# Patient Record
Sex: Female | Born: 1960 | Race: White | Hispanic: No | Marital: Married | Smoking: Former smoker
Health system: Southern US, Community
[De-identification: ages and names within clinical notes are randomized; demographics above are authoritative.]

## PROBLEM LIST (undated history)

## (undated) DIAGNOSIS — J45909 Unspecified asthma, uncomplicated: Secondary | ICD-10-CM

## (undated) DIAGNOSIS — T7840XA Allergy, unspecified, initial encounter: Secondary | ICD-10-CM

## (undated) DIAGNOSIS — I499 Cardiac arrhythmia, unspecified: Secondary | ICD-10-CM

## (undated) DIAGNOSIS — K589 Irritable bowel syndrome without diarrhea: Secondary | ICD-10-CM

## (undated) DIAGNOSIS — L719 Rosacea, unspecified: Secondary | ICD-10-CM

## (undated) DIAGNOSIS — M1990 Unspecified osteoarthritis, unspecified site: Secondary | ICD-10-CM

## (undated) DIAGNOSIS — Z9889 Other specified postprocedural states: Secondary | ICD-10-CM

## (undated) DIAGNOSIS — K635 Polyp of colon: Secondary | ICD-10-CM

## (undated) DIAGNOSIS — F32A Depression, unspecified: Secondary | ICD-10-CM

## (undated) DIAGNOSIS — K648 Other hemorrhoids: Secondary | ICD-10-CM

## (undated) DIAGNOSIS — E785 Hyperlipidemia, unspecified: Secondary | ICD-10-CM

## (undated) DIAGNOSIS — F419 Anxiety disorder, unspecified: Secondary | ICD-10-CM

## (undated) DIAGNOSIS — R112 Nausea with vomiting, unspecified: Secondary | ICD-10-CM

## (undated) DIAGNOSIS — K219 Gastro-esophageal reflux disease without esophagitis: Secondary | ICD-10-CM

## (undated) DIAGNOSIS — F329 Major depressive disorder, single episode, unspecified: Secondary | ICD-10-CM

## (undated) HISTORY — PX: CYST EXCISION: SHX5701

## (undated) HISTORY — DX: Hyperlipidemia, unspecified: E78.5

## (undated) HISTORY — PX: UPPER GASTROINTESTINAL ENDOSCOPY: SHX188

## (undated) HISTORY — PX: CHOLECYSTECTOMY: SHX55

## (undated) HISTORY — DX: Depression, unspecified: F32.A

## (undated) HISTORY — PX: CARPAL TUNNEL RELEASE: SHX101

## (undated) HISTORY — DX: Other hemorrhoids: K64.8

## (undated) HISTORY — DX: Polyp of colon: K63.5

## (undated) HISTORY — DX: Anxiety disorder, unspecified: F41.9

## (undated) HISTORY — DX: Irritable bowel syndrome, unspecified: K58.9

## (undated) HISTORY — DX: Unspecified asthma, uncomplicated: J45.909

## (undated) HISTORY — DX: Major depressive disorder, single episode, unspecified: F32.9

## (undated) HISTORY — PX: TUMOR EXCISION: SHX421

## (undated) HISTORY — PX: NASAL SEPTUM SURGERY: SHX37

## (undated) HISTORY — PX: POLYPECTOMY: SHX149

## (undated) HISTORY — PX: OTHER SURGICAL HISTORY: SHX169

## (undated) HISTORY — DX: Allergy, unspecified, initial encounter: T78.40XA

## (undated) HISTORY — PX: APPENDECTOMY: SHX54

## (undated) HISTORY — DX: Gastro-esophageal reflux disease without esophagitis: K21.9

## (undated) HISTORY — PX: FOOT SURGERY: SHX648

## (undated) HISTORY — DX: Rosacea, unspecified: L71.9

---

## 1998-09-06 HISTORY — PX: VAGINAL HYSTERECTOMY: SUR661

## 2003-06-16 ENCOUNTER — Emergency Department (HOSPITAL_COMMUNITY): Admission: EM | Admit: 2003-06-16 | Discharge: 2003-06-16 | Payer: Self-pay | Admitting: Emergency Medicine

## 2003-06-16 ENCOUNTER — Encounter: Payer: Self-pay | Admitting: Emergency Medicine

## 2005-03-03 ENCOUNTER — Emergency Department (HOSPITAL_COMMUNITY): Admission: EM | Admit: 2005-03-03 | Discharge: 2005-03-03 | Payer: Self-pay | Admitting: Emergency Medicine

## 2005-03-03 IMAGING — CR DG CHEST 2V
2 series · 2 of 2 positions shown · non-contrast
Comparison: none

CLINICAL DATA: Mid to left chest pain for a couple of days.  Previous smoker.  History of asthma as a child.      
 CHEST - TWO VIEW:  
 Accentuated peribronchial markings compatible with chronic changes.  No acute pulmonary process.  Normal cardiomediastinal silhouette size and contour.

[w chest pa]
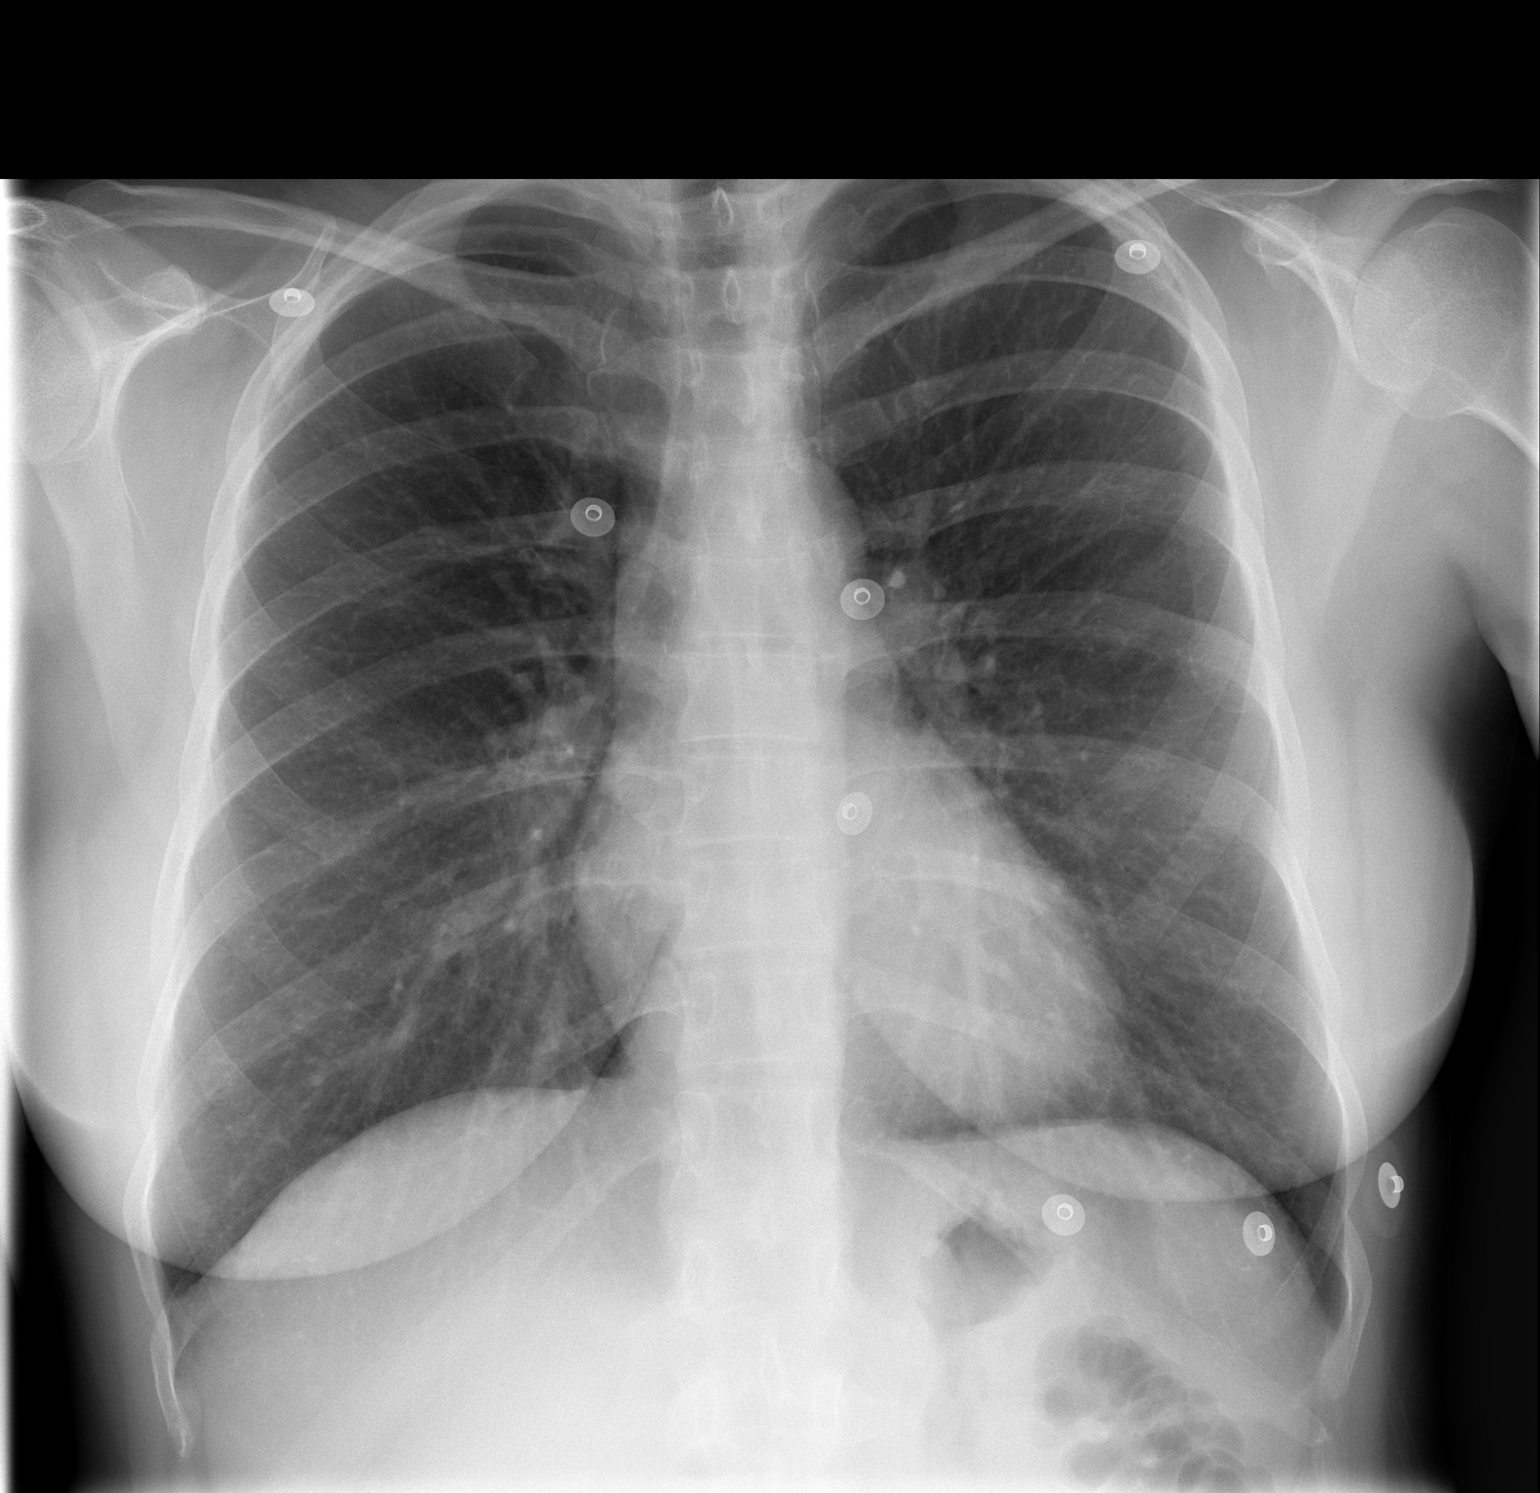

[w chest lat]
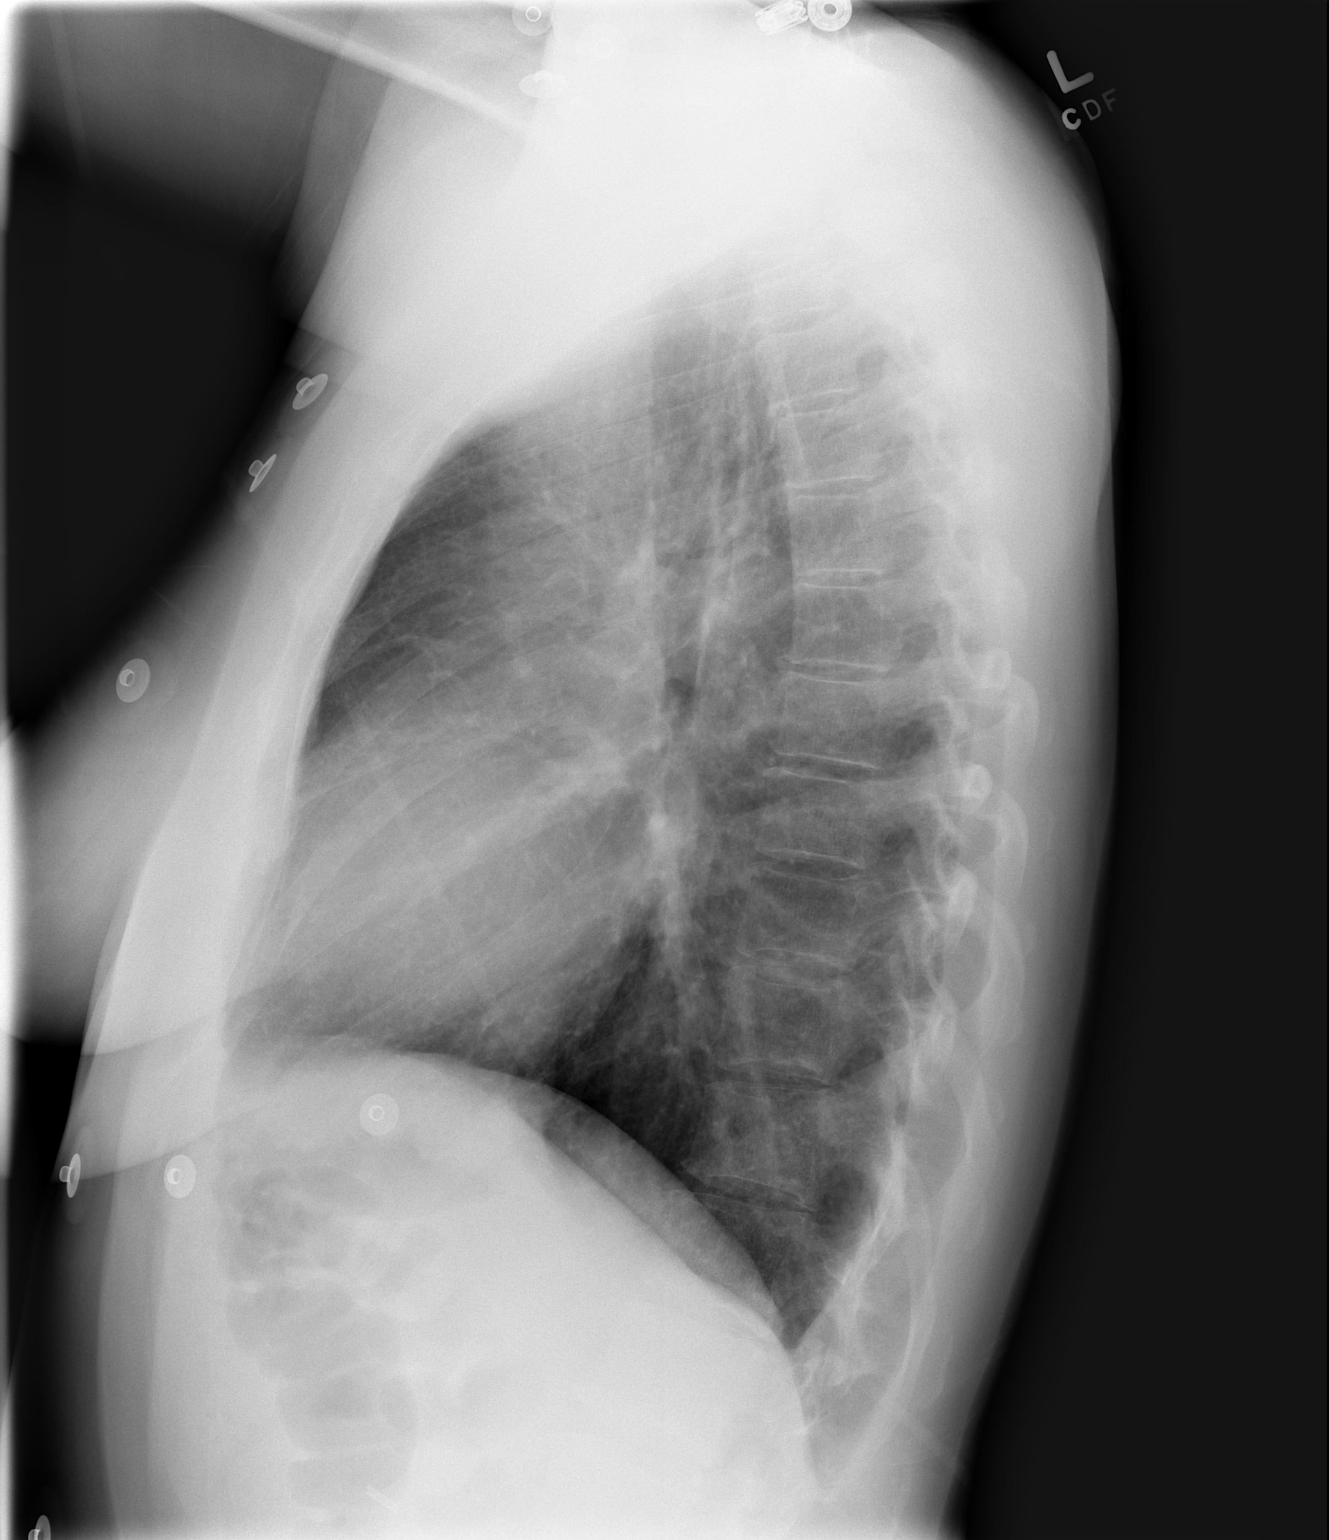

[2 of 2 positions shown; findings below may reference images not displayed]

IMPRESSION: No acute chest disease.

## 2005-09-06 HISTORY — PX: COLONOSCOPY: SHX174

## 2005-09-06 HISTORY — PX: OTHER SURGICAL HISTORY: SHX169

## 2006-03-23 ENCOUNTER — Encounter: Payer: Self-pay | Admitting: Family Medicine

## 2006-04-04 ENCOUNTER — Ambulatory Visit (HOSPITAL_COMMUNITY): Admission: RE | Admit: 2006-04-04 | Discharge: 2006-04-04 | Payer: Self-pay | Admitting: Family Medicine

## 2006-08-08 ENCOUNTER — Ambulatory Visit: Payer: Self-pay | Admitting: Internal Medicine

## 2006-08-12 LAB — HM COLONOSCOPY: HM Colonoscopy: NORMAL

## 2006-08-22 ENCOUNTER — Encounter: Payer: Self-pay | Admitting: Family Medicine

## 2006-08-22 ENCOUNTER — Ambulatory Visit: Payer: Self-pay | Admitting: Internal Medicine

## 2006-08-22 LAB — HM COLONOSCOPY: HM Colonoscopy: NORMAL

## 2007-09-07 HISTORY — PX: ULNAR NERVE TRANSPOSITION: SHX2595

## 2008-09-06 HISTORY — PX: FOOT NEUROMA SURGERY: SHX646

## 2008-10-02 ENCOUNTER — Ambulatory Visit: Payer: Self-pay | Admitting: Family Medicine

## 2008-10-02 DIAGNOSIS — R109 Unspecified abdominal pain: Secondary | ICD-10-CM | POA: Insufficient documentation

## 2008-10-02 DIAGNOSIS — L719 Rosacea, unspecified: Secondary | ICD-10-CM | POA: Insufficient documentation

## 2008-10-02 DIAGNOSIS — K589 Irritable bowel syndrome without diarrhea: Secondary | ICD-10-CM | POA: Insufficient documentation

## 2008-10-02 DIAGNOSIS — K219 Gastro-esophageal reflux disease without esophagitis: Secondary | ICD-10-CM | POA: Insufficient documentation

## 2008-10-02 DIAGNOSIS — M25519 Pain in unspecified shoulder: Secondary | ICD-10-CM | POA: Insufficient documentation

## 2008-10-02 LAB — CONVERTED CEMR LAB
ALT: 16 units/L (ref 0–35)
AST: 20 units/L (ref 0–37)
Albumin: 4.1 g/dL (ref 3.5–5.2)
Alkaline Phosphatase: 35 units/L — ABNORMAL LOW (ref 39–117)
BUN: 14 mg/dL (ref 6–23)
Basophils Absolute: 0 10*3/uL (ref 0.0–0.1)
Basophils Relative: 0.7 % (ref 0.0–3.0)
Bilirubin, Direct: 0.1 mg/dL (ref 0.0–0.3)
CO2: 28 meq/L (ref 19–32)
Calcium: 8.9 mg/dL (ref 8.4–10.5)
Chloride: 107 meq/L (ref 96–112)
Creatinine, Ser: 0.8 mg/dL (ref 0.4–1.2)
Eosinophils Absolute: 0.2 10*3/uL (ref 0.0–0.7)
Eosinophils Relative: 2.6 % (ref 0.0–5.0)
GFR calc Af Amer: 100 mL/min
GFR calc non Af Amer: 82 mL/min
Glucose, Bld: 93 mg/dL (ref 70–99)
HCT: 39 % (ref 36.0–46.0)
Hemoglobin: 13.4 g/dL (ref 12.0–15.0)
Lymphocytes Relative: 29.9 % (ref 12.0–46.0)
MCHC: 34.4 g/dL (ref 30.0–36.0)
MCV: 98.1 fL (ref 78.0–100.0)
Monocytes Absolute: 0.5 10*3/uL (ref 0.1–1.0)
Monocytes Relative: 7.5 % (ref 3.0–12.0)
Neutro Abs: 4 10*3/uL (ref 1.4–7.7)
Neutrophils Relative %: 59.3 % (ref 43.0–77.0)
Platelets: 303 10*3/uL (ref 150–400)
Potassium: 4.4 meq/L (ref 3.5–5.1)
RBC: 3.98 M/uL (ref 3.87–5.11)
RDW: 12.2 % (ref 11.5–14.6)
Sed Rate: 7 mm/hr (ref 0–22)
Sodium: 139 meq/L (ref 135–145)
Total Bilirubin: 0.6 mg/dL (ref 0.3–1.2)
Total Protein: 6.6 g/dL (ref 6.0–8.3)
WBC: 6.7 10*3/uL (ref 4.5–10.5)

## 2008-10-16 ENCOUNTER — Ambulatory Visit: Payer: Self-pay | Admitting: Family Medicine

## 2008-10-21 ENCOUNTER — Encounter (INDEPENDENT_AMBULATORY_CARE_PROVIDER_SITE_OTHER): Payer: Self-pay | Admitting: *Deleted

## 2009-02-23 ENCOUNTER — Emergency Department (HOSPITAL_COMMUNITY): Admission: EM | Admit: 2009-02-23 | Discharge: 2009-02-23 | Payer: Self-pay | Admitting: Emergency Medicine

## 2009-02-26 ENCOUNTER — Ambulatory Visit: Payer: Self-pay | Admitting: Family Medicine

## 2009-02-26 DIAGNOSIS — L03818 Cellulitis of other sites: Secondary | ICD-10-CM

## 2009-02-26 DIAGNOSIS — F341 Dysthymic disorder: Secondary | ICD-10-CM | POA: Insufficient documentation

## 2009-02-26 DIAGNOSIS — L02818 Cutaneous abscess of other sites: Secondary | ICD-10-CM | POA: Insufficient documentation

## 2009-02-26 DIAGNOSIS — J019 Acute sinusitis, unspecified: Secondary | ICD-10-CM | POA: Insufficient documentation

## 2009-02-28 ENCOUNTER — Telehealth (INDEPENDENT_AMBULATORY_CARE_PROVIDER_SITE_OTHER): Payer: Self-pay | Admitting: *Deleted

## 2009-04-03 ENCOUNTER — Encounter: Payer: Self-pay | Admitting: Family Medicine

## 2009-04-09 ENCOUNTER — Ambulatory Visit: Payer: Self-pay | Admitting: Family Medicine

## 2009-04-09 DIAGNOSIS — H6690 Otitis media, unspecified, unspecified ear: Secondary | ICD-10-CM | POA: Insufficient documentation

## 2009-04-09 DIAGNOSIS — J302 Other seasonal allergic rhinitis: Secondary | ICD-10-CM | POA: Insufficient documentation

## 2009-04-09 DIAGNOSIS — J3089 Other allergic rhinitis: Secondary | ICD-10-CM

## 2009-04-24 ENCOUNTER — Ambulatory Visit: Payer: Self-pay | Admitting: Family Medicine

## 2009-08-15 ENCOUNTER — Telehealth (INDEPENDENT_AMBULATORY_CARE_PROVIDER_SITE_OTHER): Payer: Self-pay | Admitting: *Deleted

## 2009-10-23 ENCOUNTER — Telehealth (INDEPENDENT_AMBULATORY_CARE_PROVIDER_SITE_OTHER): Payer: Self-pay | Admitting: *Deleted

## 2010-01-14 ENCOUNTER — Telehealth (INDEPENDENT_AMBULATORY_CARE_PROVIDER_SITE_OTHER): Payer: Self-pay | Admitting: *Deleted

## 2010-01-27 ENCOUNTER — Telehealth (INDEPENDENT_AMBULATORY_CARE_PROVIDER_SITE_OTHER): Payer: Self-pay | Admitting: *Deleted

## 2010-02-03 ENCOUNTER — Telehealth (INDEPENDENT_AMBULATORY_CARE_PROVIDER_SITE_OTHER): Payer: Self-pay | Admitting: *Deleted

## 2010-02-04 ENCOUNTER — Ambulatory Visit: Payer: Self-pay | Admitting: Family Medicine

## 2010-02-04 LAB — CONVERTED CEMR LAB
Basophils Absolute: 0 10*3/uL (ref 0.0–0.1)
Basophils Relative: 0.5 % (ref 0.0–3.0)
Eosinophils Absolute: 0.1 10*3/uL (ref 0.0–0.7)
Eosinophils Relative: 2.1 % (ref 0.0–5.0)
HCT: 37.4 % (ref 36.0–46.0)
Hemoglobin: 13 g/dL (ref 12.0–15.0)
Lymphocytes Relative: 28.2 % (ref 12.0–46.0)
Lymphs Abs: 1.8 10*3/uL (ref 0.7–4.0)
MCHC: 34.9 g/dL (ref 30.0–36.0)
MCV: 98.6 fL (ref 78.0–100.0)
Monocytes Absolute: 0.5 10*3/uL (ref 0.1–1.0)
Monocytes Relative: 8.4 % (ref 3.0–12.0)
Neutro Abs: 4 10*3/uL (ref 1.4–7.7)
Neutrophils Relative %: 60.8 % (ref 43.0–77.0)
Platelets: 282 10*3/uL (ref 150.0–400.0)
RBC: 3.79 M/uL — ABNORMAL LOW (ref 3.87–5.11)
RDW: 12.8 % (ref 11.5–14.6)
Sed Rate: 14 mm/hr (ref 0–22)
WBC: 6.5 10*3/uL (ref 4.5–10.5)

## 2010-02-18 ENCOUNTER — Ambulatory Visit: Payer: Self-pay | Admitting: Family

## 2010-02-18 DIAGNOSIS — L02429 Furuncle of limb, unspecified: Secondary | ICD-10-CM | POA: Insufficient documentation

## 2010-03-31 ENCOUNTER — Encounter: Payer: Self-pay | Admitting: Family Medicine

## 2010-03-31 ENCOUNTER — Encounter: Admission: RE | Admit: 2010-03-31 | Discharge: 2010-03-31 | Payer: Self-pay | Admitting: Orthopedic Surgery

## 2010-04-16 ENCOUNTER — Telehealth: Payer: Self-pay | Admitting: Family Medicine

## 2010-05-18 ENCOUNTER — Ambulatory Visit: Payer: Self-pay | Admitting: Family Medicine

## 2010-05-20 ENCOUNTER — Telehealth: Payer: Self-pay | Admitting: Family Medicine

## 2010-06-10 ENCOUNTER — Ambulatory Visit: Payer: Self-pay | Admitting: Family Medicine

## 2010-06-11 ENCOUNTER — Telehealth (INDEPENDENT_AMBULATORY_CARE_PROVIDER_SITE_OTHER): Payer: Self-pay | Admitting: *Deleted

## 2010-06-12 LAB — CONVERTED CEMR LAB
ALT: 20 units/L (ref 0–35)
AST: 21 units/L (ref 0–37)
Albumin: 4.3 g/dL (ref 3.5–5.2)
Alkaline Phosphatase: 42 units/L (ref 39–117)
BUN: 17 mg/dL (ref 6–23)
Basophils Absolute: 0 10*3/uL (ref 0.0–0.1)
Basophils Relative: 0.6 % (ref 0.0–3.0)
Bilirubin, Direct: 0.1 mg/dL (ref 0.0–0.3)
CO2: 27 meq/L (ref 19–32)
Calcium: 9.2 mg/dL (ref 8.4–10.5)
Chloride: 105 meq/L (ref 96–112)
Cholesterol: 217 mg/dL — ABNORMAL HIGH (ref 0–200)
Creatinine, Ser: 0.9 mg/dL (ref 0.4–1.2)
Direct LDL: 142.7 mg/dL
Eosinophils Absolute: 0.1 10*3/uL (ref 0.0–0.7)
Eosinophils Relative: 2.8 % (ref 0.0–5.0)
GFR calc non Af Amer: 70.31 mL/min (ref >60)
Glucose, Bld: 85 mg/dL (ref 70–99)
HCT: 38.8 % (ref 36.0–46.0)
HDL: 59.1 mg/dL (ref >39.00)
Hemoglobin: 13.3 g/dL (ref 12.0–15.0)
Lymphocytes Relative: 30 % (ref 12.0–46.0)
Lymphs Abs: 1.6 10*3/uL (ref 0.7–4.0)
MCHC: 34.3 g/dL (ref 30.0–36.0)
MCV: 100 fL (ref 78.0–100.0)
Monocytes Absolute: 0.5 10*3/uL (ref 0.1–1.0)
Monocytes Relative: 9.3 % (ref 3.0–12.0)
Neutro Abs: 3 10*3/uL (ref 1.4–7.7)
Neutrophils Relative %: 57.3 % (ref 43.0–77.0)
Platelets: 241 10*3/uL (ref 150.0–400.0)
Potassium: 4.4 meq/L (ref 3.5–5.1)
RBC: 3.88 M/uL (ref 3.87–5.11)
RDW: 12.7 % (ref 11.5–14.6)
Sodium: 140 meq/L (ref 135–145)
TSH: 2.45 microintl units/mL (ref 0.35–5.50)
Total Bilirubin: 0.7 mg/dL (ref 0.3–1.2)
Total CHOL/HDL Ratio: 4
Total Protein: 6.7 g/dL (ref 6.0–8.3)
Triglycerides: 133 mg/dL (ref 0.0–149.0)
VLDL: 26.6 mg/dL (ref 0.0–40.0)
Vit D, 25-Hydroxy: 58 ng/mL (ref 30–89)
WBC: 5.3 10*3/uL (ref 4.5–10.5)

## 2010-06-15 ENCOUNTER — Telehealth (INDEPENDENT_AMBULATORY_CARE_PROVIDER_SITE_OTHER): Payer: Self-pay | Admitting: *Deleted

## 2010-07-03 ENCOUNTER — Ambulatory Visit: Payer: Self-pay | Admitting: Family Medicine

## 2010-08-19 ENCOUNTER — Telehealth: Payer: Self-pay | Admitting: Family Medicine

## 2010-09-08 ENCOUNTER — Ambulatory Visit
Admission: RE | Admit: 2010-09-08 | Discharge: 2010-09-08 | Payer: Self-pay | Source: Home / Self Care | Attending: Internal Medicine | Admitting: Internal Medicine

## 2010-09-09 ENCOUNTER — Encounter: Payer: Self-pay | Admitting: Family Medicine

## 2010-09-10 ENCOUNTER — Encounter (INDEPENDENT_AMBULATORY_CARE_PROVIDER_SITE_OTHER): Payer: Self-pay | Admitting: *Deleted

## 2010-09-23 ENCOUNTER — Encounter (INDEPENDENT_AMBULATORY_CARE_PROVIDER_SITE_OTHER): Payer: Self-pay | Admitting: *Deleted

## 2010-09-24 ENCOUNTER — Ambulatory Visit: Admit: 2010-09-24 | Payer: Self-pay | Admitting: Internal Medicine

## 2010-10-06 NOTE — Assessment & Plan Note (Signed)
Summary: SINUS INFECTION/RH......Marland Kitchen   Vital Signs:  Patient profile:   50 year old female Height:      65.25 inches Weight:      154 pounds O2 Sat:      99 % Temp:     99.4 degrees F oral Pulse rate:   59 / minute BP sitting:   100 / 78  (left arm)  Vitals Entered By: Jeremy Johann CMA (May 18, 2010 11:01 AM) CC: headache, sore throat,drainage, dry cough,stuffy/ runny nose x5days   History of Present Illness: 50 yo woman here today for ? sinus infxn.  sxs started 5 days ago w/ sore throat.  subsequent congestion, nasal drainage, ear pain,  facial pain/pressure.  no tooth pain.  minimal cough.  subjective temp.  sxs 'above and beyond' usual allergies.  no known sick contacts.  Current Medications (verified): 1)  Zegerid 40-1100 Mg Caps (Omeprazole-Sodium Bicarbonate) .... Take One Tablet Daily 2)  Alprazolam 0.25 Mg Tabs (Alprazolam) .... Take 1-2 Tabs Q6 As Needed For Anxiety. 3)  Fluticasone Propionate 50 Mcg/act Susp (Fluticasone Propionate) .... 2 Sprays Each Nostril Daily 4)  Vitamin C 100 Mg Chew (Ascorbic Acid) .... One Tablet By Mouth Daily 5)  Vitamin D3 1000 Unit Caps (Cholecalciferol) .... One Cap By Mouth Daily 6)  Calcium-Vitamin D 600-200 Mg-Unit Tabs (Calcium-Vitamin D) .... One Tab By Mouth Daily 7)  Sm Flax Seed Oil 1000 Mg Caps (Flaxseed (Linseed)) .... One Cap By Mouth Daily 8)  Fish Oil 1000 Mg Caps (Omega-3 Fatty Acids) .... One Cap By Mouth Daily  Allergies (verified): No Known Drug Allergies  Past History:  Past Medical History: Rosacea GERD Depression Allergic rhinitis  Review of Systems      See HPI  Physical Exam  General:  Well-developed,well-nourished,in no acute distress; alert,appropriate and cooperative throughout examination Head:  Normocephalic and atraumatic without obvious abnormalities. No apparent alopecia or balding.  + TTP over maxillary and frontal sinuses Eyes:  no injxn or inflammation Ears:  External ear exam shows no  significant lesions or deformities.  Otoscopic examination reveals clear canals, tympanic membranes are intact bilaterally without bulging, retraction, inflammation or discharge. Hearing is grossly normal bilaterally. Nose:  edematous turbinates, L>R. Mouth:  + PND Neck:  No deformities, masses, or tenderness noted. Lungs:  Normal respiratory effort, chest expands symmetrically. Lungs are clear to auscultation, no crackles or wheezes. Heart:  Normal rate and regular rhythm. S1 and S2 normal without gallop, murmur, click, rub or other extra sounds.   Impression & Recommendations:  Problem # 1:  ACUTE SINUSITIS, UNSPECIFIED (ICD-461.9) Assessment Unchanged pt's sxs consistent w/ sinusitis.  had similar sxs 1 year ago.  failed Amox at that time, responded to avelox.  will start this.  reviewed supportive care and red flags that should prompt return.  Pt expresses understanding and is in agreement w/ this plan. The following medications were removed from the medication list:    Doxycycline Hyclate 100 Mg Caps (Doxycycline hyclate) ..... One tablet by mouth two times a day x 7 days Her updated medication list for this problem includes:    Fluticasone Propionate 50 Mcg/act Susp (Fluticasone propionate) .Marland Kitchen... 2 sprays each nostril daily    Avelox 400 Mg Tabs (Moxifloxacin hcl) .Marland Kitchen... 1 tablet by mouth daily  Complete Medication List: 1)  Zegerid 40-1100 Mg Caps (Omeprazole-sodium bicarbonate) .... Take one tablet daily 2)  Alprazolam 0.25 Mg Tabs (Alprazolam) .... Take 1-2 tabs q6 as needed for anxiety. 3)  Fluticasone Propionate 50  Mcg/act Susp (Fluticasone propionate) .... 2 sprays each nostril daily 4)  Vitamin C 100 Mg Chew (Ascorbic acid) .... One tablet by mouth daily 5)  Vitamin D3 1000 Unit Caps (Cholecalciferol) .... One cap by mouth daily 6)  Calcium-vitamin D 600-200 Mg-unit Tabs (Calcium-vitamin d) .... One tab by mouth daily 7)  Sm Flax Seed Oil 1000 Mg Caps (Flaxseed (linseed)) ....  One cap by mouth daily 8)  Fish Oil 1000 Mg Caps (Omega-3 fatty acids) .... One cap by mouth daily 9)  Avelox 400 Mg Tabs (Moxifloxacin hcl) .Marland Kitchen.. 1 tablet by mouth daily  Patient Instructions: 1)  Schedule your physical at your convenience- don't eat before this appt 2)  Start the avelox for the sinus infection 3)  Continue your allergy meds daily 4)  Drink plenty of fluids 5)  Tylenol/Ibuprofen as needed for pain/fever 6)  Hang in there!! Prescriptions: AVELOX 400 MG  TABS (MOXIFLOXACIN HCL) 1 tablet by mouth daily  #10 x 0   Entered and Authorized by:   [name] MD   Signed by:   [name] MD on 05/18/2010   Method used:   Electronically to        [facility] (retail)       [address]       [place], [place]  [address]       Ph: [phone]       Fax: [phone]   RxID:   [id number]

## 2010-10-06 NOTE — Assessment & Plan Note (Signed)
Summary: abd pain//lch   Vital Signs:  Patient profile:   50 year old female Weight:      150 pounds Temp:     98.6 degrees F oral Pulse rate:   64 / minute BP sitting:   110 / 60  (left arm)  Vitals Entered By: Doristine Devoid (February 04, 2010 9:12 AM) CC: diarrhea and stomach cramping    History of Present Illness: 50 yo woman here today for diarrhea and abd cramping.  sxs started 10 days ago.  4-5 loose stools/day, started as watery.  no blood.  no vomiting, mild nausea w/ the cramping.  no fevers.  denies abdominal pain when not cramping.  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  increased stress recently.  no dietary changes, foreign travel.  new puppy had 'parasites'.  episodes not related to eating.    Allergies (verified): No Known Drug Allergies  Review of Systems      See HPI  Physical Exam  General:  Well-developed,well-nourished,in no acute distress; alert,appropriate and cooperative throughout examination Eyes:  no injxn or inflammation Ears:  External ear exam shows no significant lesions or deformities.  Otoscopic examination reveals clear canals, tympanic membranes are intact bilaterally without bulging, retraction, inflammation or discharge. Hearing is grossly normal bilaterally. Nose:  edematous turbinates, L>R. Mouth:  + PND Neck:  No deformities, masses, or tenderness noted. Lungs:  Normal respiratory effort, chest expands symmetrically. Lungs are clear to auscultation, no crackles or wheezes. Heart:  Normal rate and regular rhythm. S1 and S2 normal without gallop, murmur, click, rub or other extra sounds. Abdomen:  soft, NT/ND, +BS   Impression & Recommendations:  Problem # 1:  DIARRHEA (ICD-787.91) Assessment New pt's sxs likely due to IBS flare but given new puppy w/ parasites must r/o infxn.  must also consider UC or Crohn's given pt's age, check ESR.  start bentyl as needed for abd cramping.  reviewed supportive care and red flags that should prompt  return.  Pt expresses understanding and is in agreement w/ this plan. Orders: Venipuncture (09811) TLB-CBC Platelet - w/Differential (85025-CBCD) TLB-Sedimentation Rate (ESR) (85652-ESR) T-Culture, Stool (87045/[id number]) T-Stool for O&P (91478-29562)  Problem # 2:  RHINITIS (ICD-477.9) Assessment: Unchanged no evidence of infxn, only congestion.  pt to resume flonase. Her updated medication list for this problem includes:    Fluticasone Propionate 50 Mcg/act Susp (Fluticasone propionate) .Marland Kitchen... 2 sprays each nostril daily  Complete Medication List: 1)  Zegerid 40-1100 Mg Caps (Omeprazole-sodium bicarbonate) .... Take one tablet daily 2)  Benzaclin 1-5 % Gel (Clindamycin phos-benzoyl perox) .... Use bid daily 3)  Alprazolam 0.25 Mg Tabs (Alprazolam) .... Take 1-2 tabs q6 as needed for anxiety. 4)  Fluticasone Propionate 50 Mcg/act Susp (Fluticasone propionate) .... 2 sprays each nostril daily 5)  Bentyl 20 Mg Tabs (Dicyclomine hcl) .Marland Kitchen.. 1 tab by mouth three times a day as needed for abdominal cramping  Patient Instructions: 1)  Please schedule a physical at your convience- don't eat before that appt 2)  We'll notify you of your lab results 3)  Please return your stool sample at your convenience 4)  Take the Bentyl as needed for pain and cramping 5)  Increase your water and fiber intake 6)  Call with any questions or concerns 7)  Hang in there! Prescriptions: BENTYL 20 MG TABS (DICYCLOMINE HCL) 1 tab by mouth three times a day as needed for abdominal cramping  #60 x 1   Entered and Authorized by:  [name] MD   Signed by:   [name] MD on 02/04/2010   Method used:   Electronically to        [facility] (retail)       [address]       [place], [place]  [address]       Ph: [phone]       Fax: [phone]   RxID:   [id number]

## 2010-10-06 NOTE — Assessment & Plan Note (Signed)
Summary: CPX AND FASTING LABS///SPH  Flu Vaccine Consent Questions     Do you have a history of severe allergic reactions to this vaccine? no    Any prior history of allergic reactions to egg and/or gelatin? no    Do you have a sensitivity to the preservative Thimersol? no    Do you have a past history of Guillan-Barre Syndrome? no    Do you currently have an acute febrile illness? no    Have you ever had a severe reaction to latex? no    Vaccine information given and explained to patient? yes    Are you currently pregnant? no    Lot Number:[id number]   Exp Date:03/06/2011   Site Given  Left Deltoid IM    Vital Signs:  Patient profile:   50 year old female Height:      66 inches Weight:      154 pounds BMI:     24.95 Pulse rate:   70 / minute BP sitting:   112 / 68  (left arm)  Vitals Entered By: Doristine Devoid CMA (June 10, 2010 8:34 AM) CC: CPX AND LABS W/ PAP   History of Present Illness: 50 yo woman here today for CPE.  UTD on mammogram.  s/p hysterectomy due to fibroids, ovaries remain.  no concerns today.  Preventive Screening-Counseling & Management  Alcohol-Tobacco     Alcohol drinks/day: 3     Alcohol type: wine     Smoking Status: never  Caffeine-Diet-Exercise     Does Patient Exercise: yes     Type of exercise: field hockey ref      Drug Use:  never.    Current Medications (verified): 1)  Zegerid 40-1100 Mg Caps (Omeprazole-Sodium Bicarbonate) .... Take One Tablet Daily 2)  Alprazolam 0.25 Mg Tabs (Alprazolam) .... Take 1-2 Tabs Q6 As Needed For Anxiety. 3)  Fluticasone Propionate 50 Mcg/act Susp (Fluticasone Propionate) .... 2 Sprays Each Nostril Daily 4)  Vitamin C 100 Mg Chew (Ascorbic Acid) .... One Tablet By Mouth Daily 5)  Vitamin D3 1000 Unit Caps (Cholecalciferol) .... One Cap By Mouth Daily 6)  Calcium-Vitamin D 600-200 Mg-Unit Tabs (Calcium-Vitamin D) .... One Tab By Mouth Daily 7)  Sm Flax Seed Oil 1000 Mg Caps (Flaxseed (Linseed)) .... One  Cap By Mouth Daily 8)  Fish Oil 1000 Mg Caps (Omega-3 Fatty Acids) .... One Cap By Mouth Daily  Allergies (verified): No Known Drug Allergies  Past History:  Past Medical History: Last updated: 05/18/2010 Rosacea GERD Depression Allergic rhinitis  Past Surgical History: Last updated: 10/02/2008 Appendectomy Cholecystectomy Carpal tunnel-bilateral Ulnar nerve release-bilateral "Tumor" left lower leg skin Right calcaneous surg Deviated septum  Family History: Last updated: 10/02/2008 CAD-grandfather HTN-parents,brother DM-mother STROKE-father COLON CA-no BREAST CA-no  Social History: Last updated: 06/10/2010 originally from [place] husband died 2023/06/16 1st husband lives in [place] 1 daughter now in monogamous relationship w/ Phil  Social History: originally from [place] husband died 2023/06/16 1st husband lives in [place] 1 daughter now in monogamous relationship w/ PhilDrug Use:  never  Review of Systems  The patient denies anorexia, fever, weight loss, weight gain, vision loss, decreased hearing, hoarseness, chest pain, syncope, dyspnea on exertion, peripheral edema, prolonged cough, headaches, abdominal pain, melena, hematochezia, severe indigestion/heartburn, hematuria, suspicious skin lesions, depression, abnormal bleeding, enlarged lymph nodes, and breast masses.    Physical Exam  General:  Well-developed,well-nourished,in no acute distress; alert,appropriate and cooperative throughout examination Head:  Normocephalic and atraumatic without obvious abnormalities. No apparent  alopecia or balding. Eyes:  No corneal or conjunctival inflammation noted. EOMI. Perrla. Funduscopic exam benign, without hemorrhages, exudates or papilledema. Vision grossly normal. Ears:  External ear exam shows no significant lesions or deformities.  Otoscopic examination reveals clear canals, tympanic membranes are intact bilaterally without bulging, retraction, inflammation or discharge. Hearing is grossly  normal bilaterally. Nose:  External nasal examination shows no deformity or inflammation. Nasal mucosa are pink and moist without lesions or exudates. Mouth:  Oral mucosa and oropharynx without lesions or exudates.  Teeth in good repair. Neck:  No deformities, masses, or tenderness noted. Breasts:  No mass, nodules, thickening, tenderness, bulging, retraction, inflamation, nipple discharge or skin changes noted.   Lungs:  Normal respiratory effort, chest expands symmetrically. Lungs are clear to auscultation, no crackles or wheezes. Heart:  Normal rate and regular rhythm. S1 and S2 normal without gallop, murmur, click, rub or other extra sounds. Abdomen:  Bowel sounds positive,abdomen soft and non-tender without masses, organomegaly or hernias noted. Genitalia:  normal introitus, no external lesions, no vaginal discharge, mucosa pink and moist, no vaginal atrophy, and no adnexal masses or tenderness.  s/p hysterectomy Pulses:  +2 carotid, radial, DP Extremities:  no C/C/E Neurologic:  No cranial nerve deficits noted. Station and gait are normal. Plantar reflexes are down-going bilaterally. DTRs are symmetrical throughout. Sensory, motor and coordinative functions appear intact. Skin:  Intact without suspicious lesions or rashes Cervical Nodes:  No lymphadenopathy noted Axillary Nodes:  No palpable lymphadenopathy Psych:  Cognition and judgment appear intact. Alert and cooperative with normal attention span and concentration. No apparent delusions, illusions, hallucinations   Impression & Recommendations:  Problem # 1:  ROUTINE GYNECOLOGICAL EXAMINATION (ICD-V72.31) Assessment New pt's PE WNL.  UTD on mammogram.  check labs.  anticipatory guidance provided. Orders: Venipuncture (74259) TLB-Lipid Panel (80061-LIPID) TLB-BMP (Basic Metabolic Panel-BMET) (80048-METABOL) TLB-CBC Platelet - w/Differential (85025-CBCD) TLB-Hepatic/Liver Function Pnl (80076-HEPATIC) TLB-TSH (Thyroid Stimulating  Hormone) (84443-TSH) T-Vitamin D (25-Hydroxy) (56387-56433) Specimen Handling (29518)  Problem # 2:  Preventive Health Care (ICD-V70.0) Assessment: Comment Only  Complete Medication List: 1)  Zegerid 40-1100 Mg Caps (Omeprazole-sodium bicarbonate) .... Take one tablet daily 2)  Alprazolam 0.25 Mg Tabs (Alprazolam) .... Take 1-2 tabs q6 as needed for anxiety. 3)  Fluticasone Propionate 50 Mcg/act Susp (Fluticasone propionate) .... 2 sprays each nostril daily 4)  Vitamin C 100 Mg Chew (Ascorbic acid) .... One tablet by mouth daily 5)  Vitamin D3 1000 Unit Caps (Cholecalciferol) .... One cap by mouth daily 6)  Calcium-vitamin D 600-200 Mg-unit Tabs (Calcium-vitamin d) .... One tab by mouth daily 7)  Sm Flax Seed Oil 1000 Mg Caps (Flaxseed (linseed)) .... One cap by mouth daily 8)  Fish Oil 1000 Mg Caps (Omega-3 fatty acids) .... One cap by mouth daily  Other Orders: Admin 1st Vaccine (84166) Flu Vaccine 60yrs + (06301)  Patient Instructions: 1)  Follow up in 1 year or as needed 2)  Your exam looks great!  Keep up the good work! 3)  We'll notify you of your lab results 4)  Call with any questions or concerns 5)  Have a great holiday season! 6)  Good luck with class!!

## 2010-10-06 NOTE — Progress Notes (Signed)
Summary: ALPRAZOLAM REFILL  Phone Note Refill Request Message from:  Fax from Pharmacy on June 11, 2010 11:25 AM  Refills Requested: Medication #1:  ALPRAZOLAM 0.25 MG TABS take 1-2 tabs Q6 as needed for anxiety. [facility]   QIH [phone]  Initial call taken by: Jerolyn Shin,  June 11, 2010 11:25 AM    Prescriptions: ALPRAZOLAM 0.25 MG TABS (ALPRAZOLAM) take 1-2 tabs Q6 as needed for anxiety.  #60 x 0   Entered by:   Doristine Devoid CMA   Authorized by:   [name] MD   Signed by:   Doristine Devoid CMA on 06/11/2010   Method used:   Printed then faxed to ...       [facility] (retail)       [address]       [place], [place]  [address]       Ph: [phone]       Fax: [phone]   RxID:   [id number]

## 2010-10-06 NOTE — Progress Notes (Signed)
Summary: Refill Request ALPRAZOLAM  Phone Note Refill Request Call back at [phone] Message from:  Pharmacy on Jan 27, 2010 10:35 AM  Refills Requested: Medication #1:  ALPRAZOLAM 0.25 MG TABS take 1-2 tabs Q6 as needed for anxiety.   Dosage confirmed as above?Dosage Confirmed   Supply Requested: 1 month   Last Refilled: 10/24/2009  Medication #2:  FLONASE 0.05% SPR GLAX   Dosage confirmed as above?Dosage Confirmed   Supply Requested: 1 month   Last Refilled: 12/12/2009 [facility] [address]  Next Appointment Scheduled: none Initial call taken by: Harold Barban,  Jan 27, 2010 10:36 AM  Follow-up for Phone Call        last refill alprazolam #60 x 0  on 10/24/09, last OV 04/24/09 .Kandice Hams  Jan 28, 2010 9:22 AM  Follow-up by: Kandice Hams,  Jan 28, 2010 9:22 AM  Additional Follow-up for Phone Call Additional follow up Details #1::        ok for #60, no refills. Additional Follow-up by: [name] MD,  Jan 28, 2010 9:26 AM    Prescriptions: ALPRAZOLAM 0.25 MG TABS (ALPRAZOLAM) take 1-2 tabs Q6 as needed for anxiety.  #60 x 0   Entered by:   Kandice Hams   Authorized by:   [name] MD   Signed by:   Kandice Hams on 01/28/2010   Method used:   Printed then faxed to ...       [facility] (retail)       [address]       [place], [place]  [address]       Ph: [phone]       Fax: [phone]   RxID:   [id number] NASONEX 50 MCG/ACT SUSP (MOMETASONE FUROATE) 2 sprays each nostril once daily  #1 x 3   Entered by:   Kandice Hams   Authorized by:   [name] MD   Signed by:   Kandice Hams on 01/28/2010   Method used:   Faxed to ...       [facility] (retail)       [address]       [place], [place]  [address]       Ph: [phone]       Fax: [phone]   RxID:   [id number]

## 2010-10-06 NOTE — Progress Notes (Signed)
Summary: fluticasone refill   Phone Note Refill Request Message from:  Patient on Feb 03, 2010 8:48 AM  Refills Requested: Medication #1:  FLUTICASONE PROPINOATE NASAL SPRAY   Dosage confirmed as above?Dosage Confirmed [facility] [address] Patient would like this refilled instead of the Nasonex because of costs.  Initial call taken by: Harold Barban,  Feb 03, 2010 8:48 AM    New/Updated Medications: FLUTICASONE PROPIONATE 50 MCG/ACT SUSP (FLUTICASONE PROPIONATE) 2 sprays each nostril daily Prescriptions: FLUTICASONE PROPIONATE 50 MCG/ACT SUSP (FLUTICASONE PROPIONATE) 2 sprays each nostril daily  #1 x 3   Entered by:   Doristine Devoid   Authorized by:   [name] MD   Signed by:   Doristine Devoid on 02/03/2010   Method used:   Electronically to        [facility] (retail)       [address]       [place], [place]  [address]       Ph: [phone]       Fax: [phone]   RxID:   [id number]

## 2010-10-06 NOTE — Progress Notes (Signed)
Summary: refill ALPRAZOLAM  Phone Note Refill Request Message from:  Fax from Pharmacy on October 23, 2009 12:31 PM  Refills Requested: Medication #1:  ALPRAZOLAM 0.25 MG TABS take 1-2 tabs Q6 as needed for anxiety.  Method Requested: Fax to Local Pharmacy Next Appointment Scheduled: no appt Initial call taken by: Barb Merino,  October 23, 2009 12:32 PM  Follow-up for Phone Call        ok  60, no RF Follow-up by: [facility] E. Paz MD,  October 24, 2009 8:08 AM    Prescriptions: ALPRAZOLAM 0.25 MG TABS (ALPRAZOLAM) take 1-2 tabs Q6 as needed for anxiety.  #60 x 0   Entered by:   Kandice Hams   Authorized by:   Nolon Rod. Paz MD   Signed by:   Kandice Hams on 10/24/2009   Method used:   Printed then faxed to ...       [facility] (retail)       [address]       [place], [place]  [address]       Ph: [phone]       Fax: [phone]   RxID:   [id number]

## 2010-10-06 NOTE — Assessment & Plan Note (Signed)
Summary: ear pain, sinus inf??, no fever///sph   Vital Signs:  Patient profile:   50 year old female Weight:      154 pounds Temp:     98.3 degrees F oral BP sitting:   116 / 70  (left arm)  Vitals Entered By: Doristine Devoid CMA (July 03, 2010 2:06 PM) CC: sinus infection and L ear pain    History of Present Illness: 50 yo woman here today for ? sinus infxn.  developed earache last week on L side.  improved but then developed nasal congestion, sinus pressure.  'mucous is really green but like jelly'.  + fatigue, HAs, facial pain.  no tooth pain.  intermittant cough.  some sore throat.  no known sick contacts.  Current Medications (verified): 1)  Zegerid 40-1100 Mg Caps (Omeprazole-Sodium Bicarbonate) .... Take One Tablet Daily 2)  Alprazolam 0.25 Mg Tabs (Alprazolam) .... Take 1-2 Tabs Q6 As Needed For Anxiety. 3)  Fluticasone Propionate 50 Mcg/act Susp (Fluticasone Propionate) .... 2 Sprays Each Nostril Daily 4)  Vitamin C 100 Mg Chew (Ascorbic Acid) .... One Tablet By Mouth Daily 5)  Vitamin D3 1000 Unit Caps (Cholecalciferol) .... One Cap By Mouth Daily 6)  Calcium-Vitamin D 600-200 Mg-Unit Tabs (Calcium-Vitamin D) .... One Tab By Mouth Daily 7)  Sm Flax Seed Oil 1000 Mg Caps (Flaxseed (Linseed)) .... One Cap By Mouth Daily 8)  Fish Oil 1000 Mg Caps (Omega-3 Fatty Acids) .... One Cap By Mouth Daily 9)  Amoxicillin 500 Mg Tabs (Amoxicillin) .... 2 Tabs By Mouth Two Times A Day X10 Days.  Take W/ Food.  Allergies (verified): No Known Drug Allergies  Past History:  Past Medical History: Last updated: 05/18/2010 Rosacea GERD Depression Allergic rhinitis  Review of Systems      See HPI  Physical Exam  General:  Well-developed,well-nourished,in no acute distress; alert,appropriate and cooperative throughout examination Head:  Normocephalic and atraumatic without obvious abnormalities. No apparent alopecia or balding.  + TTP over frontal and maxillary sinuses Eyes:  no  injxn or inflammation Ears:  L TM retracted Nose:  L nostril congestion and turbinate edema Mouth:  Oral mucosa and oropharynx without lesions or exudates.  Teeth in good repair. Neck:  No deformities, masses, or tenderness noted. Lungs:  Normal respiratory effort, chest expands symmetrically. Lungs are clear to auscultation, no crackles or wheezes. Heart:  Normal rate and regular rhythm. S1 and S2 normal without gallop, murmur, click, rub or other extra sounds.   Impression & Recommendations:  Problem # 1:  ACUTE SINUSITIS, UNSPECIFIED (ICD-461.9) Assessment Unchanged pt again w/ apparent infxn.  start Amox.  reviewed supportive care and red flags that should prompt return.  Pt expresses understanding and is in agreement w/ this plan. Her updated medication list for this problem includes:    Fluticasone Propionate 50 Mcg/act Susp (Fluticasone propionate) .Marland Kitchen... 2 sprays each nostril daily    Amoxicillin 500 Mg Tabs (Amoxicillin) .Marland Kitchen... 2 tabs by mouth two times a day x10 days.  take w/ food.  Complete Medication List: 1)  Zegerid 40-1100 Mg Caps (Omeprazole-sodium bicarbonate) .... Take one tablet daily 2)  Alprazolam 0.25 Mg Tabs (Alprazolam) .... Take 1-2 tabs q6 as needed for anxiety. 3)  Fluticasone Propionate 50 Mcg/act Susp (Fluticasone propionate) .... 2 sprays each nostril daily 4)  Vitamin C 100 Mg Chew (Ascorbic acid) .... One tablet by mouth daily 5)  Vitamin D3 1000 Unit Caps (Cholecalciferol) .... One cap by mouth daily 6)  Calcium-vitamin D 600-200  Mg-unit Tabs (Calcium-vitamin d) .... One tab by mouth daily 7)  Sm Flax Seed Oil 1000 Mg Caps (Flaxseed (linseed)) .... One cap by mouth daily 8)  Fish Oil 1000 Mg Caps (Omega-3 fatty acids) .... One cap by mouth daily 9)  Amoxicillin 500 Mg Tabs (Amoxicillin) .... 2 tabs by mouth two times a day x10 days.  take w/ food.  Patient Instructions: 1)  Take the Amoxicillin as directed for sinus infxn- take w/ food to avoid upset  stomach. 2)  Drink plenty of fluids 3)  Mucinex to thin your congestion and drainage 4)  REST! 5)  Hang in there!!! Prescriptions: AMOXICILLIN 500 MG TABS (AMOXICILLIN) 2 tabs by mouth two times a day x10 days.  take w/ food.  #40 x 0   Entered and Authorized by:   [name] MD   Signed by:   [name] MD on 07/03/2010   Method used:   Electronically to        [facility] (retail)       [address]       [place], [place]  [address]       Ph: [phone]       Fax: [phone]   RxID:   [id number]    Orders Added: 1)  Est. Patient Level III [84132]

## 2010-10-06 NOTE — Progress Notes (Signed)
Summary: refill  Phone Note Refill Request Message from:  Fax from Pharmacy on Jan 14, 2010 9:17 AM  Refills Requested: Medication #1:  FLONASE 0.05 USE 2 SPRAYS EACH NOSTRIL DAILY FAX [facility] [address] - fax [phone] - ph [phone]   Method Requested: Fax to Local Pharmacy Next Appointment Scheduled: none Initial call taken by: Okey Regal Spring,  Jan 14, 2010 9:37 AM    Prescriptions: NASONEX 50 MCG/ACT SUSP (MOMETASONE FUROATE) 2 sprays each nostril once daily  #1 x 3   Entered by:   Kandice Hams   Authorized by:   [name] MD   Signed by:   Kandice Hams on 01/14/2010   Method used:   Faxed to ...       [facility] (retail)       [address]       [place], [place]  [address]       Ph: [phone]       Fax: [phone]   RxID:   [id number]

## 2010-10-06 NOTE — Assessment & Plan Note (Signed)
Summary: bite / red line/cbs--Rm 4   Vital Signs:  Patient profile:   50 year old female Height:      65.25 inches Weight:      154 pounds BMI:     25.52 Temp:     98.2 degrees F oral Pulse rate:   78 / minute Pulse rhythm:   regular Resp:     12 per minute BP sitting:   124 / 78  (right arm) Cuff size:   large  Vitals Entered By: Mervin Kung CMA (February 18, 2010 3:31 PM) CC: Room 4  Pt has painful, red, inflammed appearing bump  at panty line on the right side that started Saturday and is increasing in size. Now has red line from bump to middle of upper thigh. Is Patient Diabetic? No Comments Pt states she no longer uses Benzaclin gel.  She is taking:  Vit C 1 daily. Vit D 1 once daily, Vit B 1 once daily , Calcium + D 1 once daily , Flax Seed Caps 1 once daily , fish oil caps 1 once daily and OTC allergy tab 1 once daily .   CC:  Room 4  Pt has painful, red, and inflammed appearing bump  at panty line on the right side that started Saturday and is increasing in size. Now has red line from bump to middle of upper thigh..  History of Present Illness: Ms Robin Mueller is a 50 year old female who presents today with complaint of "bite" which started above her panty line on Saturday.  Since that time she notes that the area has become increasingly tender.  She notes some streaking beneath the "bite."   Allergies (verified): No Known Drug Allergies  Physical Exam  General:  Well-developed,well-nourished,in no acute distress; alert,appropriate and cooperative throughout examination Skin:  +carbuncle noted at top of right leg anteriorly- approximately 1/2 inch wide/indurated and tender.  No fluctuance noted. No drainage noted. Slight streaking noted beneath carbuncle.   Impression & Recommendations:  Problem # 1:  CARBUNCLE, LEG (ICD-680.6) Assessment New Suspect early cellulitis.  Will treat with doxyclycline.  Patient instructed to call if symptoms worsen or if not resolved in 1  week.  Complete Medication List: 1)  Zegerid 40-1100 Mg Caps (Omeprazole-sodium bicarbonate) .... Take one tablet daily 2)  Alprazolam 0.25 Mg Tabs (Alprazolam) .... Take 1-2 tabs q6 as needed for anxiety. 3)  Fluticasone Propionate 50 Mcg/act Susp (Fluticasone propionate) .... 2 sprays each nostril daily 4)  Bentyl 20 Mg Tabs (Dicyclomine hcl) .Marland Kitchen.. 1 tab by mouth three times a day as needed for abdominal cramping 5)  Doxycycline Hyclate 100 Mg Caps (Doxycycline hyclate) .... One tablet by mouth two times a day x 7 days 6)  Vitamin C 100 Mg Chew (Ascorbic acid) .... One tablet by mouth daily 7)  Vitamin D3 1000 Unit Caps (Cholecalciferol) .... One cap by mouth daily 8)  Calcium-vitamin D 600-200 Mg-unit Tabs (Calcium-vitamin d) .... One tab by mouth daily 9)  Sm Flax Seed Oil 1000 Mg Caps (Flaxseed (linseed)) .... One cap by mouth daily 10)  Fish Oil 1000 Mg Caps (Omega-3 fatty acids) .... One cap by mouth daily  Patient Instructions: 1)  Call if symptoms worsen, or if not resolved in 1 week. Prescriptions: DOXYCYCLINE HYCLATE 100 MG CAPS (DOXYCYCLINE HYCLATE) one tablet by mouth two times a day x 7 days  #14 x 0   Entered and Authorized by:   Lemont Fillers FNP   Signed by:  Lemont Fillers FNP on 02/18/2010   Method used:   Electronically to        [facility] (retail)       [address]       [place], [place]  [address]       Ph: [phone]       Fax: [phone]   RxID:   [id number]   Current Allergies (reviewed today): No known allergies

## 2010-10-06 NOTE — Progress Notes (Signed)
Summary: med reaction  Phone Note Call from Patient Call back at [facility] Phone [phone]   Caller: Patient Summary of Call: Pt left VM that she heard that the avelox can cause tendon rupture. pt now c/o tenderness in her tendons. Pt wants to know if  she needs to be concern about. Pt is worried about a tendon rupturing..............Marland KitchenFelecia Deloach CMA  May 20, 2010 9:50 AM   Follow-up for Phone Call        this is a rare occurrence and she has taken this before w/out difficulty.  this most frequently occurs in older people w/ weak connective tissue or people who take this med frequently or repeatedly.  no need to change meds at this time Follow-up by: [name] MD,  May 20, 2010 9:55 AM  Additional Follow-up for Phone Call Additional follow up Details #1::        Discuss with patient , pt verbalized understanding ..................Marland KitchenFelecia Deloach CMA  May 20, 2010 10:13 AM

## 2010-10-06 NOTE — Progress Notes (Signed)
Summary: fluticasone refill   Phone Note Refill Request Message from:  Fax from Pharmacy on June 15, 2010 10:41 AM  Refills Requested: Medication #1:  FLUTICASONE PROPIONATE 50 MCG/ACT SUSP 2 sprays each nostril daily [facility] [address] [facility] fax [phone]  Initial call taken by: Okey Regal Spring,  June 15, 2010 10:42 AM    Prescriptions: FLUTICASONE PROPIONATE 50 MCG/ACT SUSP (FLUTICASONE PROPIONATE) 2 sprays each nostril daily  #1 x 3   Entered by:   Doristine Devoid CMA   Authorized by:   [name] MD   Signed by:   Doristine Devoid CMA on 06/15/2010   Method used:   Electronically to        [facility] (retail)       [address]       [place], [place]  [address]       Ph: [phone]       Fax: [phone]   RxID:   [id number]

## 2010-10-06 NOTE — Progress Notes (Signed)
Summary: refill--alprazolam  Phone Note Refill Request Message from:  Fax from Pharmacy on April 16, 2010 10:05 AM  Refills Requested: Medication #1:  ALPRAZOLAM 0.25 MG TABS take 1-2 tabs Q6 as needed for anxiety.   Dosage confirmed as above?Dosage Confirmed [facility] - fax [phone]  Last seen 02/18/10  Next Appointment Scheduled: none Initial call taken by: Okey Regal Spring,  April 16, 2010 10:07 AM  Follow-up for Phone Call        ok for #60, no refills Follow-up by: [name] MD,  April 16, 2010 10:46 AM  Additional Follow-up for Phone Call Additional follow up Details #1::        Refill left on pharmacy voicemail. Nicki Guadalajara Fergerson CMA Duncan Dull)  April 16, 2010 11:31 AM     Prescriptions: ALPRAZOLAM 0.25 MG TABS (ALPRAZOLAM) take 1-2 tabs Q6 as needed for anxiety.  #60 x 0   Entered by:   Mervin Kung CMA (AAMA)   Authorized by:   [name] MD   Signed by:   Mervin Kung CMA (AAMA) on 04/16/2010   Method used:   Telephoned to ...       [facility] (retail)       [address]       [place], [place]  [address]       Ph: [phone]       Fax: [phone]   RxID:   [id number]

## 2010-10-08 NOTE — Miscellaneous (Signed)
  Clinical Lists Changes  Observations: Added new observation of MAMMOGRAM: normal (09/09/2010 11:34)      Preventive Care Screening  Mammogram:    Date:  09/09/2010    Results:  normal

## 2010-10-08 NOTE — Progress Notes (Signed)
Summary: refill  Phone Note Refill Request Message from:  Fax from Pharmacy on August 19, 2010 11:00 AM  Refills Requested: Medication #1:  ALPRAZOLAM 0.25 MG TABS take 1-2 tabs Q6 as needed for anxiety. [facility] [address] - fax [phone]  Initial call taken by: Okey Regal Spring,  August 19, 2010 11:00 AM  Follow-up for Phone Call        cpx 06-10-10, last filled 06-11-10 #60 ..........Marland KitchenFelecia Deloach CMA  August 19, 2010 12:06 PM   Additional Follow-up for Phone Call Additional follow up Details #1::        ok for #60, no refills. Additional Follow-up by: [name] MD,  August 19, 2010 1:02 PM    Prescriptions: ALPRAZOLAM 0.25 MG TABS (ALPRAZOLAM) take 1-2 tabs Q6 as needed for anxiety.  #60 x 0   Entered by:   Jeremy Johann CMA   Authorized by:   [name] MD   Signed by:   Jeremy Johann CMA on 08/19/2010   Method used:   Printed then faxed to ...       [facility] (retail)       [address]       [place], [place]  [address]       Ph: [phone]       Fax: [phone]   RxID:   [id number]

## 2010-10-08 NOTE — Assessment & Plan Note (Signed)
  Nurse Visit   Preventive Screening-Counseling & Management  Comments: Patient was screened for [place] IBS-D study and found ineligible by her daily diary.  Flexible sigmoidoscopy scheduled for 09/24/10 with Dr. Marina Goodell was cancelled and he was notified.   Allergies: No Known Drug Allergies

## 2010-11-09 ENCOUNTER — Telehealth: Payer: Self-pay | Admitting: Family Medicine

## 2010-11-17 NOTE — Progress Notes (Signed)
Summary: refill  Phone Note Refill Request Message from:  Fax from Pharmacy on November 09, 2010 3:23 PM  Refills Requested: Medication #1:  ALPRAZOLAM 0.25 MG TABS take 1-2 tabs Q6 as needed for anxiety. [facility] [address] - fax [phone]  Initial call taken by: Okey Regal Spring,  November 09, 2010 3:24 PM  Follow-up for Phone Call        Received #60 in 08/2010. Please advise. Lucious Groves CMA  November 09, 2010 3:57 PM   Additional Follow-up for Phone Call Additional follow up Details #1::        ok for #60 Additional Follow-up by: [name] MD,  November 09, 2010 4:08 PM    Prescriptions: ALPRAZOLAM 0.25 MG TABS (ALPRAZOLAM) take 1-2 tabs Q6 as needed for anxiety.  #60 x 0   Entered by:   Lucious Groves CMA   Authorized by:   [name] MD   Signed by:   Lucious Groves CMA on 11/09/2010   Method used:   Telephoned to ...       [facility] (retail)       [address]       [place], [place]  [address]       Ph: [phone]       Fax: [phone]   RxID:   [id number]

## 2010-11-30 ENCOUNTER — Encounter: Payer: Self-pay | Admitting: Family Medicine

## 2010-12-02 ENCOUNTER — Ambulatory Visit (INDEPENDENT_AMBULATORY_CARE_PROVIDER_SITE_OTHER): Payer: BC Managed Care – PPO | Admitting: Family Medicine

## 2010-12-02 ENCOUNTER — Encounter: Payer: Self-pay | Admitting: Family Medicine

## 2010-12-02 VITALS — BP 120/82 | Temp 98.1°F | Ht 66.25 in | Wt 158.1 lb

## 2010-12-02 DIAGNOSIS — J019 Acute sinusitis, unspecified: Secondary | ICD-10-CM

## 2010-12-02 MED ORDER — AMOXICILLIN-POT CLAVULANATE 875-125 MG PO TABS: 1.0000 | ORAL_TABLET | Freq: Two times a day (BID) | ORAL | Status: AC

## 2010-12-02 NOTE — Patient Instructions (Signed)
Take the Augmentin for the sinus infection- take w/ food to avoid upset stomach Drink plenty of fluids Mucinex to thin your congestion Limit Afrin use to 3 days Call w/ any questions or concerns Hang in there!!!

## 2010-12-02 NOTE — Assessment & Plan Note (Signed)
Pt's sxs and exam consistent w/ infxn.  Start augmentin.  Reviewed supportive care and red flags that should prompt return.  Pt expressed understanding and is in agreement w/ plan.

## 2010-12-02 NOTE — Progress Notes (Signed)
  Subjective:    Patient ID: Robin Mueller, female    DOB: 16-May-1961, 50 y.o.   MRN: [id number]  HPI Sinusitis- sxs started Friday.  Sunday developed facial pain, pressure, tooth pain, HA.  Denies ear pain.  Minimal cough.  + PND.  Using flonase and zyrtec daily.  Hx of recurrent sinus infxns.  Using Afrin w/ some relief.   Review of Systems For ROS see HPI     Objective:   Physical Exam  Constitutional: She appears well-developed and well-nourished. No distress.  HENT:  Head: Normocephalic and atraumatic.  Right Ear: Tympanic membrane normal.  Left Ear: Tympanic membrane normal.  Nose: Mucosal edema and rhinorrhea present. Right sinus exhibits maxillary sinus tenderness and frontal sinus tenderness. Left sinus exhibits maxillary sinus tenderness and frontal sinus tenderness.  Mouth/Throat: Uvula is midline and mucous membranes are normal. Posterior oropharyngeal erythema present. No oropharyngeal exudate.  Eyes: Conjunctivae and EOM are normal. Pupils are equal, round, and reactive to light.  Neck: Normal range of motion. Neck supple.  Cardiovascular: Normal rate, regular rhythm and normal heart sounds.   Pulmonary/Chest: Effort normal and breath sounds normal. No respiratory distress. She has no wheezes.  Lymphadenopathy:    She has no cervical adenopathy.          Assessment & Plan:

## 2010-12-14 ENCOUNTER — Other Ambulatory Visit: Payer: Self-pay | Admitting: Family Medicine

## 2010-12-14 LAB — MAGNESIUM: Magnesium: 1.8 mg/dL (ref 1.5–2.5)

## 2010-12-14 LAB — POCT I-STAT, CHEM 8
BUN: 16 mg/dL (ref 6–23)
Calcium, Ion: 1.05 mmol/L — ABNORMAL LOW (ref 1.12–1.32)
Hemoglobin: 12.9 g/dL (ref 12.0–15.0)
TCO2: 21 mmol/L (ref 0–100)

## 2010-12-14 LAB — POCT CARDIAC MARKERS

## 2010-12-14 LAB — T4, FREE: Free T4: 1.29 ng/dL (ref 0.80–1.80)

## 2010-12-14 LAB — TSH: TSH: 0.75 u[IU]/mL (ref 0.350–4.500)

## 2011-03-22 ENCOUNTER — Other Ambulatory Visit: Payer: Self-pay | Admitting: Family Medicine

## 2011-03-22 MED ORDER — ALPRAZOLAM 0.25 MG PO TABS: 0.2500 mg | ORAL_TABLET | ORAL | Status: DC

## 2011-03-22 NOTE — Telephone Encounter (Signed)
Per Dr Laury Axon

## 2011-03-22 NOTE — Telephone Encounter (Signed)
Refill x1 

## 2011-03-22 NOTE — Telephone Encounter (Signed)
Pt was seen for acute in December 02, 2010. Last refilled November 09 2010. Please advise.

## 2011-03-22 NOTE — Telephone Encounter (Signed)
Will fax hard copy

## 2011-03-25 ENCOUNTER — Other Ambulatory Visit: Payer: Self-pay | Admitting: Family Medicine

## 2011-03-25 NOTE — Telephone Encounter (Signed)
Refill sent.

## 2011-04-28 ENCOUNTER — Ambulatory Visit (INDEPENDENT_AMBULATORY_CARE_PROVIDER_SITE_OTHER): Payer: BC Managed Care – PPO | Admitting: Family Medicine

## 2011-04-28 VITALS — BP 100/76 | HR 52 | Temp 98.5°F | Wt 142.0 lb

## 2011-04-28 DIAGNOSIS — J019 Acute sinusitis, unspecified: Secondary | ICD-10-CM

## 2011-04-28 MED ORDER — CLARITHROMYCIN ER 500 MG PO TB24: 1000.0000 mg | ORAL_TABLET | Freq: Every day | ORAL | Status: AC

## 2011-04-28 NOTE — Assessment & Plan Note (Signed)
Pt's sxs and PE consistent w/ infxn.  Start abx.  Reviewed supportive care and red flags that should prompt return.  Pt expressed understanding and is in agreement w/ plan.  

## 2011-04-28 NOTE — Progress Notes (Signed)
  Subjective:    Patient ID: Robin Mueller, female    DOB: 11-21-1960, 50 y.o.   MRN: [id number]  HPI ? Sinus infxn- sxs started 6 days ago w/ sore throat and HA.  + bodyaches.  Somewhat improved on Saturday.  Sunday 'thought i was turning the corner but then Monday came'.  Worsening sinus pain, L ear pain, cough.  No fevers.  No known sick contacts.   Review of Systems For ROS see HPI     Objective:   Physical Exam  Constitutional: She appears well-developed and well-nourished. No distress.  HENT:  Head: Normocephalic and atraumatic.  Right Ear: Tympanic membrane normal.  Left Ear: Tympanic membrane normal.  Nose: Mucosal edema and rhinorrhea present. Right sinus exhibits maxillary sinus tenderness and frontal sinus tenderness. Left sinus exhibits maxillary sinus tenderness and frontal sinus tenderness.  Mouth/Throat: Uvula is midline and mucous membranes are normal. Posterior oropharyngeal erythema present. No oropharyngeal exudate.  Eyes: Conjunctivae and EOM are normal. Pupils are equal, round, and reactive to light.  Neck: Normal range of motion. Neck supple.  Cardiovascular: Normal rate, regular rhythm and normal heart sounds.   Pulmonary/Chest: Effort normal and breath sounds normal. No respiratory distress. She has no wheezes.  Lymphadenopathy:    She has no cervical adenopathy.          Assessment & Plan:

## 2011-04-28 NOTE — Patient Instructions (Signed)
Take the Biaxin as directed- take w/ food to avoid upset stomach Add Mucinex to thin your congestion Continue the allergy meds Drink plenty of fluids REST! Hang in there!

## 2011-05-26 ENCOUNTER — Encounter: Payer: Self-pay | Admitting: Family Medicine

## 2011-05-26 ENCOUNTER — Ambulatory Visit (INDEPENDENT_AMBULATORY_CARE_PROVIDER_SITE_OTHER): Payer: 59 | Admitting: Family Medicine

## 2011-05-26 VITALS — BP 108/74 | Temp 98.7°F | Wt 140.0 lb

## 2011-05-26 DIAGNOSIS — M79643 Pain in unspecified hand: Secondary | ICD-10-CM | POA: Insufficient documentation

## 2011-05-26 DIAGNOSIS — M79609 Pain in unspecified limb: Secondary | ICD-10-CM

## 2011-05-26 LAB — RHEUMATOID FACTOR: Rhuematoid fact SerPl-aCnc: <10 IU/mL (ref <=14)

## 2011-05-26 NOTE — Patient Instructions (Signed)
We'll notify you of your lab results Tylenol arthritis as needed for joint pain Call with any questions or concerns Hang in there!!!

## 2011-05-26 NOTE — Progress Notes (Signed)
  Subjective:    Patient ID: Robin Mueller, female    DOB: Oct 28, 1960, 50 y.o.   MRN: [id number]  HPI Arthritis- went to UC b/c 'finger was all blown up and I couldn't move it'.  Had xray that should 'significant arthritis'.  Pt's concern is that grandmother had RA.  Has 5th finger pain and deformity bilaterally and R index finger pain.  Pain and stiffness is worse in morning- tends to improve as day goes on.  Review of Systems For ROS see HPI     Objective:   Physical Exam  Nursing note and vitals reviewed. Constitutional: She appears well-developed and well-nourished. No distress.  Musculoskeletal: She exhibits edema and tenderness.       Limited ROM of 5th fingers bilaterally and R index finger Some TTP over PIP and DIP joint of R index finger Mild swelling of R index finger          Assessment & Plan:

## 2011-05-27 NOTE — Progress Notes (Signed)
Quick Note:    Results mailed.  ______

## 2011-05-27 NOTE — Assessment & Plan Note (Signed)
Pt most likely has OA from overuse but given family hx of RA will check labs.  Pt expressed understanding and is in agreement w/ plan.

## 2011-07-01 ENCOUNTER — Other Ambulatory Visit: Payer: Self-pay | Admitting: Family Medicine

## 2011-07-01 MED ORDER — ALPRAZOLAM 0.25 MG PO TABS: 0.2500 mg | ORAL_TABLET | ORAL | Status: DC

## 2011-07-01 NOTE — Telephone Encounter (Signed)
Rx phoned in.   

## 2011-07-01 NOTE — Telephone Encounter (Signed)
Ok for #60, no refills 

## 2011-07-01 NOTE — Telephone Encounter (Signed)
Last OV 05/26/11. Last refill 03/22/11

## 2011-07-20 ENCOUNTER — Encounter: Payer: Self-pay | Admitting: Family Medicine

## 2011-07-20 ENCOUNTER — Ambulatory Visit (INDEPENDENT_AMBULATORY_CARE_PROVIDER_SITE_OTHER): Payer: 59 | Admitting: Family Medicine

## 2011-07-20 VITALS — BP 112/70 | Temp 97.9°F | Wt 142.4 lb

## 2011-07-20 DIAGNOSIS — J329 Chronic sinusitis, unspecified: Secondary | ICD-10-CM

## 2011-07-20 MED ORDER — FLUTICASONE PROPIONATE 50 MCG/ACT NA SUSP: NASAL | Status: DC

## 2011-07-20 MED ORDER — AMOXICILLIN-POT CLAVULANATE 875-125 MG PO TABS: 1.0000 | ORAL_TABLET | Freq: Two times a day (BID) | ORAL | Status: DC

## 2011-07-20 NOTE — Progress Notes (Signed)
  Subjective:     Robin Mueller is a 50 y.o. female who presents for evaluation of sinus pain. Symptoms include: congestion, cough, facial pain, fevers, headaches, nasal congestion, sinus pressure, sneezing and tooth pain. Onset of symptoms was 10 days ago. Symptoms have been gradually worsening since that time. Past history is significant for no history of pneumonia or bronchitis. Patient is a former smoker, quit 12  years ago.   The following portions of the patient's history were reviewed and updated as appropriate: allergies, current medications, past family history, past medical history, past social history, past surgical history and problem list. Review of Systems Pertinent items are noted in HPI.   Objective:    BP 112/70  Temp(Src) 97.9 F (36.6 C) (Oral)  Wt 142 lb 6.4 oz (64.592 kg) General appearance: alert, cooperative, appears stated age and no distress Ears: normal TM's and external ear canals both ears Nose: yellow discharge, mild congestion, sinus tenderness bilateral Throat: lips, mucosa, and tongue normal; teeth and gums normal Neck: no adenopathy, no carotid bruit, no JVD, supple, symmetrical, trachea midline and thyroid not enlarged, symmetric, no tenderness/mass/nodules Lungs: clear to auscultation bilaterally Abdomen: soft, non-tender; bowel sounds normal; no masses,  no organomegaly    Assessment:    Acute bacterial sinusitis.    Plan:    Neti pot recommended. Instructions given. Nasal steroids per medication orders. Antihistamines per medication orders. Augmentin per medication orders. Follow up in a few days or as needed.

## 2011-07-20 NOTE — Patient Instructions (Signed)

## 2011-08-06 ENCOUNTER — Ambulatory Visit (INDEPENDENT_AMBULATORY_CARE_PROVIDER_SITE_OTHER): Payer: 59 | Admitting: Family Medicine

## 2011-08-06 ENCOUNTER — Encounter: Payer: Self-pay | Admitting: Family Medicine

## 2011-08-06 VITALS — BP 118/70 | HR 61 | Temp 99.0°F | Ht 66.0 in | Wt 140.8 lb

## 2011-08-06 DIAGNOSIS — J019 Acute sinusitis, unspecified: Secondary | ICD-10-CM

## 2011-08-06 MED ORDER — CLARITHROMYCIN ER 500 MG PO TB24: 1000.0000 mg | ORAL_TABLET | Freq: Every day | ORAL | Status: DC

## 2011-08-06 NOTE — Assessment & Plan Note (Signed)
Pt's hx and PE consistent w/ infxn.  Start abx.  Reviewed supportive care and red flags that should prompt return.  Pt expressed understanding and is in agreement w/ plan.  

## 2011-08-06 NOTE — Patient Instructions (Signed)
Take the Biaxin- 2 tabs at the same time w/ food. Drink plenty of fluids REST! Mucinex to thin the congestion Hang in there! Happy Holidays!!!

## 2011-08-06 NOTE — Progress Notes (Signed)
  Subjective:    Patient ID: Robin Mueller, female    DOB: [birth year], 50 y.o.   MRN: [id number]  HPI ? Sinus infxn- sxs originally started 3 weeks ago, was started on Amox.  sxs improved after abx and then returned.  + nasal congestion, facial pain, dark mucous 'almost black', ear pressure, minimal cough.  Hx of recurrent sinus infections.  Review of Systems For ROS see HPI     Objective:   Physical Exam  Vitals reviewed. Constitutional: She appears well-developed and well-nourished. No distress.  HENT:  Head: Normocephalic and atraumatic.  Right Ear: Tympanic membrane normal.  Left Ear: Tympanic membrane normal.  Nose: Mucosal edema and rhinorrhea present. Right sinus exhibits maxillary sinus tenderness and frontal sinus tenderness. Left sinus exhibits maxillary sinus tenderness and frontal sinus tenderness.  Mouth/Throat: Uvula is midline and mucous membranes are normal. Posterior oropharyngeal erythema present. No oropharyngeal exudate.  Eyes: Conjunctivae and EOM are normal. Pupils are equal, round, and reactive to light.  Neck: Normal range of motion. Neck supple.  Cardiovascular: Normal rate, regular rhythm and normal heart sounds.   Pulmonary/Chest: Effort normal and breath sounds normal. No respiratory distress. She has no wheezes.  Lymphadenopathy:    She has no cervical adenopathy.          Assessment & Plan:

## 2011-09-17 ENCOUNTER — Encounter: Payer: Self-pay | Admitting: Internal Medicine

## 2011-10-19 ENCOUNTER — Ambulatory Visit (INDEPENDENT_AMBULATORY_CARE_PROVIDER_SITE_OTHER): Payer: 59 | Admitting: Family Medicine

## 2011-10-19 ENCOUNTER — Encounter: Payer: Self-pay | Admitting: Family Medicine

## 2011-10-19 ENCOUNTER — Other Ambulatory Visit (HOSPITAL_COMMUNITY)
Admission: RE | Admit: 2011-10-19 | Discharge: 2011-10-19 | Disposition: A | Discharge status: Home / Self Care | Payer: 59 | Source: Ambulatory Visit | Attending: Family Medicine | Admitting: Family Medicine

## 2011-10-19 VITALS — BP 110/70 | HR 57 | Temp 98.4°F | Ht 66.0 in | Wt 139.6 lb

## 2011-10-19 DIAGNOSIS — Z0000 Encounter for general adult medical examination without abnormal findings: Secondary | ICD-10-CM | POA: Insufficient documentation

## 2011-10-19 DIAGNOSIS — M722 Plantar fascial fibromatosis: Secondary | ICD-10-CM

## 2011-10-19 DIAGNOSIS — Z01419 Encounter for gynecological examination (general) (routine) without abnormal findings: Secondary | ICD-10-CM | POA: Insufficient documentation

## 2011-10-19 LAB — CBC WITH DIFFERENTIAL/PLATELET
Basophils Relative: 0.6 % (ref 0.0–3.0)
Eosinophils Relative: 2 % (ref 0.0–5.0)
Hemoglobin: 13.1 g/dL (ref 12.0–15.0)
Lymphocytes Relative: 28.5 % (ref 12.0–46.0)
MCV: 101.8 fl — ABNORMAL HIGH (ref 78.0–100.0)
Neutrophils Relative %: 62.4 % (ref 43.0–77.0)
RBC: 3.83 Mil/uL — ABNORMAL LOW (ref 3.87–5.11)
WBC: 8.1 10*3/uL (ref 4.5–10.5)

## 2011-10-19 NOTE — Assessment & Plan Note (Signed)
Pt's PE WNL.  UTD on health maintenance.  Check labs.  Anticipatory guidance provided.  

## 2011-10-19 NOTE — Progress Notes (Signed)
  Subjective:    Patient ID: Robin Mueller, female    DOB: [birth year], 51 y.o.   MRN: [id number]  HPI CPE- UTD on mammo and DEXA and colonoscopy.    Plantar fasciitis- L heel pain, started after walking [place] in flip-flops.  Pain improves w/ NSAIDs and supportive shoes.   Review of Systems Pain under R rib- intermittent, will disappear for months at a time.  No current sxs.  Will occur when running  Patient reports no vision/ hearing changes, adenopathy,fever, weight change,  persistant/recurrent hoarseness , swallowing issues, chest pain, palpitations, edema, persistant/recurrent cough, hemoptysis, dyspnea (rest/exertional/paroxysmal nocturnal), gastrointestinal bleeding (melena, rectal bleeding), abdominal pain, significant heartburn, bowel changes, GU symptoms (dysuria, hematuria, incontinence), Gyn symptoms (abnormal  bleeding, pain),  syncope, focal weakness, memory loss, numbness & tingling, skin/hair/nail changes, abnormal bruising or bleeding, anxiety, or depression.     Objective:   Physical Exam  General Appearance:    Alert, cooperative, no distress, appears stated age  Head:    Normocephalic, without obvious abnormality, atraumatic  Eyes:    PERRL, conjunctiva/corneas clear, EOM's intact, fundi    benign, both eyes  Ears:    Normal TM's and external ear canals, both ears  Nose:   Nares normal, septum midline, mucosa normal, no drainage    or sinus tenderness  Throat:   Lips, mucosa, and tongue normal; teeth and gums normal  Neck:   Supple, symmetrical, trachea midline, no adenopathy;    Thyroid: no enlargement/tenderness/nodules  Back:     Symmetric, no curvature, ROM normal, no CVA tenderness  Lungs:     Clear to auscultation bilaterally, respirations unlabored  Chest Wall:    No tenderness or deformity   Heart:    Regular rate and rhythm, S1 and S2 normal, no murmur, rub   or gallop  Breast Exam:    No tenderness, masses, or nipple abnormality  Abdomen:      Soft, non-tender, bowel sounds active all four quadrants,    no masses, no organomegaly  Genitalia:    External genitalia normal, cervix and uterus surgically absent, adnexa w/out mass or tenderness, mucosa pink and moist, no lesions or discharge present  Rectal:    Normal external appearance  Extremities:   Extremities normal, atraumatic, no cyanosis or edema.  + TTP over insertion of plantar fascia on L  Pulses:   2+ and symmetric all extremities  Skin:   Skin color, texture, turgor normal, no rashes or lesions  Lymph nodes:   Cervical, supraclavicular, and axillary nodes normal  Neurologic:   CNII-XII intact, normal strength, sensation and reflexes    throughout          Assessment & Plan:

## 2011-10-19 NOTE — Assessment & Plan Note (Signed)
New.  Likely due to excessive walking in unsupportive shoes.  NSAIDs, ice, sneakers.  Reviewed supportive care and red flags that should prompt return.  Pt expressed understanding and is in agreement w/ plan.

## 2011-10-19 NOTE — Patient Instructions (Signed)
We'll notify you of your lab results Keep up the good work!  You look great! Call with any questions or concerns Happy Valentine's Day!!!

## 2011-10-20 LAB — BASIC METABOLIC PANEL
BUN: 21 mg/dL (ref 6–23)
Chloride: 105 mEq/L (ref 96–112)
Glucose, Bld: 86 mg/dL (ref 70–99)
Potassium: 4.2 mEq/L (ref 3.5–5.1)

## 2011-10-20 LAB — HEPATIC FUNCTION PANEL
ALT: 19 U/L (ref 0–35)
AST: 22 U/L (ref 0–37)
Albumin: 4.3 g/dL (ref 3.5–5.2)
Total Protein: 6.8 g/dL (ref 6.0–8.3)

## 2011-10-20 LAB — TSH: TSH: 1.61 u[IU]/mL (ref 0.35–5.50)

## 2011-10-21 LAB — VITAMIN D 1,25 DIHYDROXY
Vitamin D 1, 25 (OH)2 Total: 55 pg/mL (ref 18–72)
Vitamin D3 1, 25 (OH)2: 55 pg/mL

## 2011-10-22 ENCOUNTER — Encounter: Payer: Self-pay | Admitting: *Deleted

## 2011-10-25 ENCOUNTER — Other Ambulatory Visit: Payer: Self-pay

## 2011-10-26 ENCOUNTER — Encounter: Payer: Self-pay | Admitting: Family Medicine

## 2011-11-01 ENCOUNTER — Encounter: Payer: Self-pay | Admitting: Family Medicine

## 2011-11-02 ENCOUNTER — Encounter: Payer: Self-pay | Admitting: Family Medicine

## 2012-01-24 ENCOUNTER — Other Ambulatory Visit: Payer: Self-pay | Admitting: Family Medicine

## 2012-01-25 NOTE — Telephone Encounter (Signed)
rx sent to pharmacy by e-script  

## 2012-02-08 ENCOUNTER — Ambulatory Visit (AMBULATORY_SURGERY_CENTER): Payer: 59 | Admitting: *Deleted

## 2012-02-08 VITALS — Ht 66.0 in | Wt 138.4 lb

## 2012-02-08 DIAGNOSIS — Z1211 Encounter for screening for malignant neoplasm of colon: Secondary | ICD-10-CM

## 2012-02-08 MED ORDER — PEG-KCL-NACL-NASULF-NA ASC-C 100 G PO SOLR: ORAL | Status: DC

## 2012-02-09 ENCOUNTER — Encounter: Payer: Self-pay | Admitting: Internal Medicine

## 2012-02-11 ENCOUNTER — Telehealth: Payer: Self-pay | Admitting: Internal Medicine

## 2012-02-11 NOTE — Telephone Encounter (Signed)
No charge. 

## 2012-02-15 ENCOUNTER — Encounter: Payer: 59 | Admitting: Internal Medicine

## 2012-03-02 ENCOUNTER — Encounter: Payer: Self-pay | Admitting: Family Medicine

## 2012-03-02 ENCOUNTER — Ambulatory Visit (INDEPENDENT_AMBULATORY_CARE_PROVIDER_SITE_OTHER): Payer: 59 | Admitting: Family Medicine

## 2012-03-02 VITALS — BP 118/71 | HR 63 | Temp 98.4°F | Ht 66.0 in | Wt 138.0 lb

## 2012-03-02 DIAGNOSIS — J019 Acute sinusitis, unspecified: Secondary | ICD-10-CM

## 2012-03-02 MED ORDER — CLARITHROMYCIN ER 500 MG PO TB24: 1000.0000 mg | ORAL_TABLET | Freq: Every day | ORAL | Status: DC

## 2012-03-02 NOTE — Progress Notes (Signed)
  Subjective:    Patient ID: Robin Mueller, female    DOB: 12/18/1960, 51 y.o.   MRN: [id number]  HPI ? Sinus infxn- sxs started 10 days ago, + facial pain, nasal congestion.  Subjective temp.  L ear pain.  No N/V.  Mild cough due to PND.  No known sick contacts.   Review of Systems For ROS see HPI     Objective:   Physical Exam  Constitutional: She appears well-developed and well-nourished. No distress.  HENT:  Head: Normocephalic and atraumatic.  Right Ear: Tympanic membrane normal.  Left Ear: Tympanic membrane normal.  Nose: Mucosal edema and rhinorrhea present. Right sinus exhibits maxillary sinus tenderness. Right sinus exhibits no frontal sinus tenderness. Left sinus exhibits maxillary sinus tenderness. Left sinus exhibits no frontal sinus tenderness.  Mouth/Throat: Uvula is midline and mucous membranes are normal. Posterior oropharyngeal erythema present. No oropharyngeal exudate.  Eyes: Conjunctivae and EOM are normal. Pupils are equal, round, and reactive to light.  Neck: Normal range of motion. Neck supple.  Cardiovascular: Normal rate, regular rhythm and normal heart sounds.   Pulmonary/Chest: Effort normal and breath sounds normal. No respiratory distress. She has no wheezes.  Lymphadenopathy:    She has no cervical adenopathy.          Assessment & Plan:

## 2012-03-02 NOTE — Assessment & Plan Note (Signed)
Pt's sxs and PE consistent w/ infxn.  Start abx.  Reviewed supportive care and red flags that should prompt return.  Pt expressed understanding and is in agreement w/ plan.  

## 2012-03-02 NOTE — Patient Instructions (Addendum)
Start the Biaxin- 2 tabs at the same time- w/ food Drink plenty of fluids Mucinex to thin your congestion REST! Hang in there!!!

## 2012-03-14 ENCOUNTER — Encounter: Payer: Self-pay | Admitting: Family Medicine

## 2012-03-14 MED ORDER — AZITHROMYCIN 250 MG PO TABS: ORAL_TABLET | ORAL | Status: DC

## 2012-03-14 NOTE — Telephone Encounter (Signed)
Pt requesting more assistance with ear pain, MD Tabori gave verbal order to send pt a zpack of 250mg , [facility] [address], pt made aware via my chart message

## 2012-05-01 ENCOUNTER — Encounter: Payer: Self-pay | Admitting: Family Medicine

## 2012-05-09 ENCOUNTER — Telehealth: Payer: Self-pay | Admitting: Family Medicine

## 2012-05-09 MED ORDER — FLUTICASONE PROPIONATE 50 MCG/ACT NA SUSP: 2.0000 | Freq: Every day | NASAL | Status: DC

## 2012-05-09 NOTE — Telephone Encounter (Signed)
Refill: Fluticasone spr hi-t. Use 2 sprays in each nostril once daily. Qty 16. Last fill 03-22-12

## 2012-05-09 NOTE — Telephone Encounter (Signed)
rx sent to pharmacy by e-script  

## 2012-05-31 ENCOUNTER — Encounter: Payer: Self-pay | Admitting: Internal Medicine

## 2012-06-01 ENCOUNTER — Encounter: Payer: Self-pay | Admitting: Family Medicine

## 2012-06-02 ENCOUNTER — Encounter: Payer: Self-pay | Admitting: Family Medicine

## 2012-06-02 ENCOUNTER — Ambulatory Visit (INDEPENDENT_AMBULATORY_CARE_PROVIDER_SITE_OTHER): Payer: 59 | Admitting: Family Medicine

## 2012-06-02 VITALS — BP 115/72 | HR 66 | Temp 98.6°F | Ht 65.5 in | Wt 136.0 lb

## 2012-06-02 DIAGNOSIS — J019 Acute sinusitis, unspecified: Secondary | ICD-10-CM

## 2012-06-02 MED ORDER — CLARITHROMYCIN ER 500 MG PO TB24: 1000.0000 mg | ORAL_TABLET | Freq: Every day | ORAL | Status: DC

## 2012-06-02 NOTE — Assessment & Plan Note (Signed)
Recurrent issue for pt.  Start abx.  Reviewed supportive care and red flags that should prompt return.  Pt expressed understanding and is in agreement w/ plan.  

## 2012-06-02 NOTE — Progress Notes (Signed)
  Subjective:    Patient ID: Robin Mueller, female    DOB: 11-18-1960, 51 y.o.   MRN: [id number]  HPI ? Sinus infxn- initially started w/ allergy sxs and progressively worsened.  Has felt badly x10 days.  + nasal congestion, facial pain.  No tooth pain.  L ear pain.  Minimal cough in the AM.  Was previously able to blow nose, for last 3 days nothing is moving.  + HA.   Review of Systems For ROS see HPI     Objective:   Physical Exam  Vitals reviewed. Constitutional: She appears well-developed and well-nourished. No distress.  HENT:  Head: Normocephalic and atraumatic.  Right Ear: Tympanic membrane normal.  Left Ear: Tympanic membrane normal.  Nose: Mucosal edema and rhinorrhea present. Right sinus exhibits maxillary sinus tenderness and frontal sinus tenderness. Left sinus exhibits maxillary sinus tenderness and frontal sinus tenderness.  Mouth/Throat: Uvula is midline and mucous membranes are normal. Posterior oropharyngeal erythema present. No oropharyngeal exudate.  Eyes: Conjunctivae normal and EOM are normal. Pupils are equal, round, and reactive to light.  Neck: Normal range of motion. Neck supple.  Cardiovascular: Normal rate, regular rhythm and normal heart sounds.   Pulmonary/Chest: Effort normal and breath sounds normal. No respiratory distress. She has no wheezes.  Lymphadenopathy:    She has no cervical adenopathy.          Assessment & Plan:

## 2012-06-02 NOTE — Patient Instructions (Addendum)
This is a sinus infection Start the Biaxin- 2 tabs daily at the same time w/ food (start tonight) Plenty of fluids REST! Hang in there!!!

## 2012-06-20 ENCOUNTER — Encounter: Payer: Self-pay | Admitting: Family Medicine

## 2012-06-20 DIAGNOSIS — J349 Unspecified disorder of nose and nasal sinuses: Secondary | ICD-10-CM

## 2012-06-20 MED ORDER — CLARITHROMYCIN ER 500 MG PO TB24: 1000.0000 mg | ORAL_TABLET | Freq: Every day | ORAL | Status: DC

## 2012-06-20 NOTE — Telephone Encounter (Signed)
Referral placed in chart for ENT per verbal from MD Tabori, another round of ABT sent to pt pharmacy per MD Tabori verbal, pt notified via my chart about ABT and referral, aware someone will call her with ENT apt date and time

## 2012-07-05 ENCOUNTER — Other Ambulatory Visit: Payer: Self-pay | Admitting: Family Medicine

## 2012-07-05 MED ORDER — ALPRAZOLAM 0.25 MG PO TABS: 0.2500 mg | ORAL_TABLET | ORAL | Status: DC

## 2012-07-05 NOTE — Telephone Encounter (Signed)
.  rx faxed to pharmacy, manually.  

## 2012-07-05 NOTE — Telephone Encounter (Signed)
Refill Alprazolam 0.25MG  TAB #60 Take one or two tablets by mouth every six hours as needed for anxiety--last fill per fax 10.25.13--last ov 9.27.13 Acute

## 2012-07-05 NOTE — Telephone Encounter (Signed)
Last OV 06-02-12 last refill 07-01-2011 #60 with no refills

## 2012-07-05 NOTE — Telephone Encounter (Signed)
Ok for #60, no refills 

## 2012-09-11 ENCOUNTER — Other Ambulatory Visit: Payer: Self-pay | Admitting: Family Medicine

## 2012-09-11 MED ORDER — FLUTICASONE PROPIONATE 50 MCG/ACT NA SUSP: 2.0000 | Freq: Every day | NASAL | Status: DC

## 2012-09-11 NOTE — Telephone Encounter (Signed)
Rx sent to the pharmacy by e-script.//AB/CMA 

## 2012-09-11 NOTE — Telephone Encounter (Signed)
refill Fluticasone 50 MCG/ACT Place 2 sprays into the nose daily. #16 last fill 11.26.13

## 2012-10-03 ENCOUNTER — Ambulatory Visit: Payer: 59

## 2012-10-21 ENCOUNTER — Other Ambulatory Visit: Payer: Self-pay

## 2012-10-23 ENCOUNTER — Ambulatory Visit (INDEPENDENT_AMBULATORY_CARE_PROVIDER_SITE_OTHER): Payer: 59 | Admitting: Family Medicine

## 2012-10-23 ENCOUNTER — Encounter: Payer: Self-pay | Admitting: Lab

## 2012-10-23 ENCOUNTER — Encounter: Payer: Self-pay | Admitting: Family Medicine

## 2012-10-23 VITALS — BP 110/60 | HR 53 | Temp 97.7°F | Ht 65.25 in | Wt 137.6 lb

## 2012-10-23 DIAGNOSIS — N951 Menopausal and female climacteric states: Secondary | ICD-10-CM

## 2012-10-23 DIAGNOSIS — Z0000 Encounter for general adult medical examination without abnormal findings: Secondary | ICD-10-CM

## 2012-10-23 DIAGNOSIS — Z0184 Encounter for antibody response examination: Secondary | ICD-10-CM

## 2012-10-23 DIAGNOSIS — Z23 Encounter for immunization: Secondary | ICD-10-CM

## 2012-10-23 DIAGNOSIS — Z111 Encounter for screening for respiratory tuberculosis: Secondary | ICD-10-CM

## 2012-10-23 NOTE — Assessment & Plan Note (Signed)
Pt's PE WNL.  UTD on mammo.  Check labs.  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  Anticipatory guidance provided.

## 2012-10-23 NOTE — Progress Notes (Signed)
  Subjective:    Patient ID: Robin Mueller, female    DOB: 1960/10/23, 52 y.o.   MRN: [id number]  HPI CPE- s/p hysterectomy, no need for pap.  UTD on mammo, colonoscopy.  Would like blood work to check hormone levels for menopause.   Review of Systems Patient reports no vision/ hearing changes, adenopathy,fever, weight change,  persistant/recurrent hoarseness , swallowing issues, chest pain, palpitations, edema, persistant/recurrent cough, hemoptysis, dyspnea (rest/exertional/paroxysmal nocturnal), gastrointestinal bleeding (melena, rectal bleeding), abdominal pain, significant heartburn, bowel changes, GU symptoms (dysuria, hematuria, incontinence), Gyn symptoms (abnormal  bleeding, pain),  syncope, focal weakness, memory loss, numbness & tingling, skin/hair/nail changes, abnormal bruising or bleeding, anxiety, or depression.     Objective:   Physical Exam General Appearance:    Alert, cooperative, no distress, appears stated age  Head:    Normocephalic, without obvious abnormality, atraumatic  Eyes:    PERRL, conjunctiva/corneas clear, EOM's intact, fundi    benign, both eyes  Ears:    Normal TM's and external ear canals, both ears  Nose:   Nares normal, septum midline, mucosa normal, no drainage    or sinus tenderness  Throat:   Lips, mucosa, and tongue normal; teeth and gums normal  Neck:   Supple, symmetrical, trachea midline, no adenopathy;    Thyroid: no enlargement/tenderness/nodules  Back:     Symmetric, no curvature, ROM normal, no CVA tenderness  Lungs:     Clear to auscultation bilaterally, respirations unlabored  Chest Wall:    No tenderness or deformity   Heart:    Regular rate and rhythm, S1 and S2 normal, no murmur, rub   or gallop  Breast Exam:    Deferred to mammo  Abdomen:     Soft, non-tender, bowel sounds active all four quadrants,    no masses, no organomegaly  Genitalia:    Deferred due to TAH  Rectal:    Extremities:   Extremities normal, atraumatic, no  cyanosis or edema  Pulses:   2+ and symmetric all extremities  Skin:   Skin color, texture, turgor normal, no rashes or lesions  Lymph nodes:   Cervical, supraclavicular, and axillary nodes normal  Neurologic:   CNII-XII intact, normal strength, sensation and reflexes    throughout          Assessment & Plan:

## 2012-10-23 NOTE — Patient Instructions (Signed)
Schedule your TB test read on Wednesday after 4 or Thursday AM We'll notify you of your lab results Call with any questions or concerns Keep up the good work!

## 2012-10-24 LAB — BASIC METABOLIC PANEL
CO2: 27 mEq/L (ref 19–32)
Calcium: 9.1 mg/dL (ref 8.4–10.5)
Sodium: 138 mEq/L (ref 135–145)

## 2012-10-24 LAB — HEPATIC FUNCTION PANEL
Alkaline Phosphatase: 32 U/L — ABNORMAL LOW (ref 39–117)
Bilirubin, Direct: 0.1 mg/dL (ref 0.0–0.3)
Total Protein: 7.1 g/dL (ref 6.0–8.3)

## 2012-10-24 LAB — CBC WITH DIFFERENTIAL/PLATELET
Basophils Absolute: 0 10*3/uL (ref 0.0–0.1)
HCT: 40.6 % (ref 36.0–46.0)
Lymphocytes Relative: 36.1 % (ref 12.0–46.0)
Lymphs Abs: 2.7 10*3/uL (ref 0.7–4.0)
Monocytes Relative: 6.8 % (ref 3.0–12.0)
Platelets: 293 10*3/uL (ref 150.0–400.0)
RDW: 12.3 % (ref 11.5–14.6)

## 2012-10-24 LAB — MEASLES/MUMPS/RUBELLA IMMUNITY
Mumps IgG: >300 AU/mL — ABNORMAL HIGH (ref <9.00)
Rubeola IgG: >300 AU/mL — ABNORMAL HIGH (ref <25.00)

## 2012-10-24 LAB — LIPID PANEL
Cholesterol: 203 mg/dL — ABNORMAL HIGH (ref 0–200)
Triglycerides: 105 mg/dL (ref 0.0–149.0)

## 2012-10-24 LAB — HEPATITIS B SURFACE ANTIBODY, QUANTITATIVE: Hepatitis B-Post: 0 m[IU]/mL

## 2012-10-24 LAB — TSH: TSH: 2.29 u[IU]/mL (ref 0.35–5.50)

## 2012-10-24 LAB — VARICELLA ZOSTER ANTIBODY, IGG: Varicella IgG: 1108 Index — ABNORMAL HIGH (ref <135.00)

## 2012-10-26 ENCOUNTER — Telehealth: Payer: Self-pay | Admitting: Family Medicine

## 2012-10-26 LAB — TB SKIN TEST
Induration: 0 mm
TB Skin Test: NEGATIVE

## 2012-10-26 NOTE — Telephone Encounter (Signed)
Pt had issues with her insurance not paying for her Rx- she wanted to check to make sure that whatever we sent to pharm was her new ins????? I told her that the pharm has there own copy of her card. That we are not connected to them. Sorry but if you know anything can you give her a call.

## 2012-10-27 LAB — VITAMIN D 1,25 DIHYDROXY: Vitamin D2 1, 25 (OH)2: <8 pg/mL

## 2012-10-31 NOTE — Telephone Encounter (Signed)
LM @ (10:50am) asking the pt to RTC.//AB/CMA

## 2012-10-31 NOTE — Telephone Encounter (Signed)
Spoke with the pt and everything has been taking care of with the insurance.//AB/CMA

## 2013-01-11 ENCOUNTER — Other Ambulatory Visit: Payer: Self-pay | Admitting: Family Medicine

## 2013-01-12 NOTE — Telephone Encounter (Signed)
Rx sent to the pharmacy by e-script.//AB/CMA 

## 2013-03-14 ENCOUNTER — Encounter: Payer: Self-pay | Admitting: Family Medicine

## 2013-03-15 ENCOUNTER — Ambulatory Visit (INDEPENDENT_AMBULATORY_CARE_PROVIDER_SITE_OTHER): Payer: 59 | Admitting: Family Medicine

## 2013-03-15 ENCOUNTER — Encounter: Payer: Self-pay | Admitting: Family Medicine

## 2013-03-15 VITALS — BP 100/60 | HR 62 | Temp 98.4°F | Wt 136.0 lb

## 2013-03-15 DIAGNOSIS — H6690 Otitis media, unspecified, unspecified ear: Secondary | ICD-10-CM

## 2013-03-15 MED ORDER — IBUPROFEN 800 MG PO TABS: 800.0000 mg | ORAL_TABLET | Freq: Three times a day (TID) | ORAL | Status: DC | PRN

## 2013-03-15 MED ORDER — PROMETHAZINE-DM 6.25-15 MG/5ML PO SYRP: 5.0000 mL | ORAL_SOLUTION | Freq: Four times a day (QID) | ORAL | Status: DC | PRN

## 2013-03-15 MED ORDER — AZITHROMYCIN 250 MG PO TABS: ORAL_TABLET | ORAL | Status: DC

## 2013-03-15 MED ORDER — ALPRAZOLAM 0.25 MG PO TABS: 0.2500 mg | ORAL_TABLET | ORAL | Status: DC

## 2013-03-15 NOTE — Assessment & Plan Note (Signed)
Pt again w/ L OM.  Will start cough med for chest congestion.  abx for ear infxn.  Reviewed supportive care and red flags that should prompt return.  Pt expressed understanding and is in agreement w/ plan.

## 2013-03-15 NOTE — Progress Notes (Signed)
  Subjective:    Patient ID: Robin Mueller, female    DOB: May 16, 1961, 52 y.o.   MRN: [id number]  HPI URI- sxs started w/ chest congestion 6 weeks ago.  Went to [facility] and dx'd w/ pink eye.  Has had congestion since- improves during day but worsens night and AM.  Cough is productive of green sputum.  + HA, ear pain.  Now interfering w/ exercise.  No fevers.  + nasal congestion, facial pressure.  + sick contacts.   Review of Systems For ROS see HPI     Objective:   Physical Exam  Constitutional: She appears well-developed and well-nourished. No distress.  HENT:  Head: Normocephalic and atraumatic.  Right Ear: Tympanic membrane normal.  Left Ear: Tympanic membrane is bulging. A middle ear effusion is present.  Nose: Mucosal edema and rhinorrhea present. Right sinus exhibits maxillary sinus tenderness and frontal sinus tenderness. Left sinus exhibits maxillary sinus tenderness and frontal sinus tenderness.  Mouth/Throat: Uvula is midline and mucous membranes are normal. Posterior oropharyngeal erythema present. No oropharyngeal exudate.  Eyes: Conjunctivae and EOM are normal. Pupils are equal, round, and reactive to light.  Neck: Normal range of motion. Neck supple.  Cardiovascular: Normal rate, regular rhythm and normal heart sounds.   Pulmonary/Chest: Effort normal and breath sounds normal. No respiratory distress. She has no wheezes.  Lymphadenopathy:    She has no cervical adenopathy.          Assessment & Plan:

## 2013-03-15 NOTE — Patient Instructions (Addendum)
Start the Zpack as directed Use the cough syrup as needed Mucinex during the day to thin your congestion Drink plenty of fluids REST! Hang in there!!!

## 2013-05-21 ENCOUNTER — Encounter: Payer: Self-pay | Admitting: Family Medicine

## 2013-05-21 ENCOUNTER — Ambulatory Visit (INDEPENDENT_AMBULATORY_CARE_PROVIDER_SITE_OTHER): Payer: 59 | Admitting: Family Medicine

## 2013-05-21 VITALS — BP 118/78 | HR 63 | Temp 98.2°F | Wt 137.8 lb

## 2013-05-21 DIAGNOSIS — J019 Acute sinusitis, unspecified: Secondary | ICD-10-CM

## 2013-05-21 MED ORDER — SULFAMETHOXAZOLE-TMP DS 800-160 MG PO TABS: 1.0000 | ORAL_TABLET | Freq: Two times a day (BID) | ORAL | Status: DC

## 2013-05-21 NOTE — Progress Notes (Signed)
  Subjective:    Patient ID: Robin Mueller, female    DOB: 04/22/1961, 52 y.o.   MRN: [id number]  HPI URI- sxs started 2 weeks ago w/ nasal congestion, facial pressure, tooth pain.  No fevers.  Intermittent ear pain.  Hx of similar.  No nausea.  + cough from PND.  No known sick contacts.  Allergic to Amox.   Review of Systems For ROS see HPI     Objective:   Physical Exam  Vitals reviewed. Constitutional: She appears well-developed and well-nourished. No distress.  HENT:  Head: Normocephalic and atraumatic.  Right Ear: Tympanic membrane normal.  Left Ear: Tympanic membrane normal.  Nose: Mucosal edema and rhinorrhea present. Right sinus exhibits maxillary sinus tenderness and frontal sinus tenderness. Left sinus exhibits maxillary sinus tenderness and frontal sinus tenderness.  Mouth/Throat: Uvula is midline and mucous membranes are normal. Posterior oropharyngeal erythema present. No oropharyngeal exudate.  Eyes: Conjunctivae and EOM are normal. Pupils are equal, round, and reactive to light.  Neck: Normal range of motion. Neck supple.  Cardiovascular: Normal rate, regular rhythm and normal heart sounds.   Pulmonary/Chest: Effort normal and breath sounds normal. No respiratory distress. She has no wheezes.  Lymphadenopathy:    She has no cervical adenopathy.          Assessment & Plan:

## 2013-05-21 NOTE — Assessment & Plan Note (Signed)
Pt's sxs and PE consistent w/ infxn.  Recurrent problem for pt.  Start abx.  Reviewed supportive care and red flags that should prompt return.  Pt expressed understanding and is in agreement w/ plan.

## 2013-05-21 NOTE — Patient Instructions (Addendum)
This is a sinus infection Drink plenty of fluids Start the bactrim twice daily Continue your daily allergy meds Call with any questions or concerns Hang in there!!!

## 2013-06-19 ENCOUNTER — Ambulatory Visit (INDEPENDENT_AMBULATORY_CARE_PROVIDER_SITE_OTHER): Payer: 59 | Admitting: Family Medicine

## 2013-06-19 ENCOUNTER — Encounter: Payer: Self-pay | Admitting: Family Medicine

## 2013-06-19 VITALS — BP 120/76 | HR 58 | Temp 98.2°F | Resp 16 | Wt 134.1 lb

## 2013-06-19 DIAGNOSIS — J019 Acute sinusitis, unspecified: Secondary | ICD-10-CM

## 2013-06-19 MED ORDER — CLARITHROMYCIN ER 500 MG PO TB24: 1000.0000 mg | ORAL_TABLET | Freq: Every day | ORAL | Status: DC

## 2013-06-19 NOTE — Progress Notes (Signed)
  Subjective:    Patient ID: Robin Mueller, female    DOB: 1961-04-12, 52 y.o.   MRN: [id number]  HPI URI- last seen 9/15, took Zpack, felt better.  5 days after Zpack, sxs returned.  Started w/ sore throat, PND, then sinus pain/pressure, 'horrible taste in mouth'.  No fevers.  No nausea.  L ear pain.  No known sick contacts.  Mild cough from PND.   Review of Systems For ROS see HPI     Objective:   Physical Exam  Vitals reviewed. Constitutional: She appears well-developed and well-nourished. No distress.  HENT:  Head: Normocephalic and atraumatic.  Right Ear: Tympanic membrane normal.  Left Ear: Tympanic membrane normal.  Nose: Mucosal edema and rhinorrhea present. Right sinus exhibits no maxillary sinus tenderness and no frontal sinus tenderness. Left sinus exhibits maxillary sinus tenderness and frontal sinus tenderness.  Mouth/Throat: Uvula is midline and mucous membranes are normal. Posterior oropharyngeal erythema present. No oropharyngeal exudate.  Eyes: Conjunctivae and EOM are normal. Pupils are equal, round, and reactive to light.  Neck: Normal range of motion. Neck supple.  Cardiovascular: Normal rate, regular rhythm and normal heart sounds.   Pulmonary/Chest: Effort normal and breath sounds normal. No respiratory distress. She has no wheezes.  Lymphadenopathy:    She has no cervical adenopathy.          Assessment & Plan:

## 2013-06-19 NOTE — Patient Instructions (Signed)
Start the Biaxin- 2 tabs at the same time- w/ food Drink plenty of fluids Continue your daily allergy meds Call with any questions or concerns Hang in there!!!

## 2013-06-19 NOTE — Assessment & Plan Note (Signed)
Recurrent problem for pt.  Pt's sxs and PE consistent w/ PE.  Start Biaxin.  Reviewed supportive care and red flags that should prompt return.  Pt expressed understanding and is in agreement w/ plan.

## 2013-07-12 ENCOUNTER — Other Ambulatory Visit: Payer: Self-pay

## 2013-07-30 ENCOUNTER — Encounter: Payer: Self-pay | Admitting: Family Medicine

## 2013-07-31 MED ORDER — FLUTICASONE PROPIONATE 50 MCG/ACT NA SUSP: NASAL | Status: DC

## 2013-07-31 MED ORDER — OMEPRAZOLE-SODIUM BICARBONATE 40-1100 MG PO CAPS: 1.0000 | ORAL_CAPSULE | Freq: Every day | ORAL | Status: DC

## 2013-07-31 NOTE — Telephone Encounter (Signed)
Med filled.  

## 2013-08-17 ENCOUNTER — Other Ambulatory Visit: Payer: Self-pay | Admitting: Family Medicine

## 2013-08-17 NOTE — Telephone Encounter (Signed)
Med filled.  

## 2013-09-24 LAB — HM MAMMOGRAPHY: HM MAMMO: NORMAL

## 2013-10-16 ENCOUNTER — Encounter: Payer: Self-pay | Admitting: Family Medicine

## 2013-11-06 ENCOUNTER — Telehealth: Payer: Self-pay | Admitting: *Deleted

## 2013-11-06 NOTE — Telephone Encounter (Signed)
Prior authorization paperwork for Omeprazole faxed. Awaiting response. JG//CMA

## 2013-11-10 ENCOUNTER — Encounter: Payer: Self-pay | Admitting: Family Medicine

## 2013-11-29 ENCOUNTER — Other Ambulatory Visit: Payer: Self-pay | Admitting: Family Medicine

## 2013-11-29 NOTE — Telephone Encounter (Signed)
Med filled.  

## 2014-03-01 ENCOUNTER — Other Ambulatory Visit: Payer: Self-pay | Admitting: Family Medicine

## 2014-03-01 NOTE — Telephone Encounter (Signed)
Med filled.  

## 2014-04-03 ENCOUNTER — Telehealth: Payer: Self-pay | Admitting: Family Medicine

## 2014-04-03 MED ORDER — OMEPRAZOLE-SODIUM BICARBONATE 40-1100 MG PO CAPS: ORAL_CAPSULE | ORAL | Status: DC

## 2014-04-03 NOTE — Telephone Encounter (Signed)
Med filled.  

## 2014-04-03 NOTE — Telephone Encounter (Signed)
Caller name: Nicholos JohnsKathleen Relation to pt: Call back number:[phone] Pharmacy: [facility] [address]   Reason for call:  Pt has CPE scheduled for 11/5 but needs a refill on Rx omeprazole-sodium bicarbonate (ZEGERID) 40-1100 MG per capsule.

## 2014-04-19 ENCOUNTER — Encounter: Payer: Self-pay | Admitting: Internal Medicine

## 2014-05-21 ENCOUNTER — Other Ambulatory Visit: Payer: Self-pay | Admitting: General Practice

## 2014-05-21 MED ORDER — FLUTICASONE PROPIONATE 50 MCG/ACT NA SUSP: NASAL | Status: DC

## 2014-05-23 ENCOUNTER — Other Ambulatory Visit: Payer: Self-pay | Admitting: General Practice

## 2014-05-23 MED ORDER — FLUTICASONE PROPIONATE 50 MCG/ACT NA SUSP: NASAL | Status: DC

## 2014-05-30 ENCOUNTER — Encounter: Payer: Self-pay | Admitting: Family Medicine

## 2014-05-30 ENCOUNTER — Ambulatory Visit (INDEPENDENT_AMBULATORY_CARE_PROVIDER_SITE_OTHER): Payer: BC Managed Care – PPO | Admitting: Family Medicine

## 2014-05-30 VITALS — BP 118/76 | HR 54 | Temp 98.0°F | Resp 16 | Wt 140.0 lb

## 2014-05-30 DIAGNOSIS — H6593 Unspecified nonsuppurative otitis media, bilateral: Secondary | ICD-10-CM

## 2014-05-30 DIAGNOSIS — H6590 Unspecified nonsuppurative otitis media, unspecified ear: Secondary | ICD-10-CM | POA: Insufficient documentation

## 2014-05-30 DIAGNOSIS — J019 Acute sinusitis, unspecified: Secondary | ICD-10-CM

## 2014-05-30 MED ORDER — SULFAMETHOXAZOLE-TMP DS 800-160 MG PO TABS: 1.0000 | ORAL_TABLET | Freq: Two times a day (BID) | ORAL | Status: DC

## 2014-05-30 NOTE — Assessment & Plan Note (Signed)
New.  Pt w/ clear fluid visible behind TMs bilaterally.  Start OTC sudafed for symptom improvement.  Reviewed supportive care and red flags that should prompt return. Pt expressed understanding and is in agreement w/ plan.

## 2014-05-30 NOTE — Assessment & Plan Note (Signed)
Pt's sxs and PE consistent w/ infxn.  Start abx.  Reviewed supportive care and red flags that should prompt return.  Pt expressed understanding and is in agreement w/ plan.  

## 2014-05-30 NOTE — Progress Notes (Signed)
Pre visit review using our clinic review tool, if applicable. No additional management support is needed unless otherwise documented below in the visit note. 

## 2014-05-30 NOTE — Progress Notes (Signed)
   Subjective:    Patient ID: Robin Mueller, female    DOB: May 01, 1961, 53 y.o.   MRN: [id number]  Sinusitis Associated symptoms include coughing and headaches.  Headache  Associated symptoms include coughing.  Cough Associated symptoms include headaches.   URI- + HA, nasal congestion, ear pain, sore throat.  sxs have been intermittent for months.  No fevers.  No bloody drainage- yellow green.  Mild nausea.  + dizziness, facial pressure.   Review of Systems  Respiratory: Positive for cough.   Neurological: Positive for headaches.   For ROS see HPI     Objective:   Physical Exam  Vitals reviewed. Constitutional: She appears well-developed and well-nourished. No distress.  HENT:  Head: Normocephalic and atraumatic.  Right Ear: A middle ear effusion is present.  Left Ear: A middle ear effusion is present.  Nose: Mucosal edema and rhinorrhea present. Right sinus exhibits maxillary sinus tenderness and frontal sinus tenderness. Left sinus exhibits maxillary sinus tenderness and frontal sinus tenderness.  Mouth/Throat: Uvula is midline and mucous membranes are normal. Posterior oropharyngeal erythema present. No oropharyngeal exudate.  Eyes: Conjunctivae and EOM are normal. Pupils are equal, round, and reactive to light.  Neck: Normal range of motion. Neck supple.  Cardiovascular: Normal rate, regular rhythm and normal heart sounds.   Pulmonary/Chest: Effort normal and breath sounds normal. No respiratory distress. She has no wheezes.  Lymphadenopathy:    She has no cervical adenopathy.          Assessment & Plan:

## 2014-05-30 NOTE — Patient Instructions (Signed)
Follow up as needed Start the Bactrim twice daily- take w/ food Drink plenty of fluids OTC Sudafed for 3-5 days for the fluid in the ear REST! Call with any questions or concerns Hang in there!

## 2014-07-02 ENCOUNTER — Encounter: Payer: Self-pay | Admitting: Family Medicine

## 2014-07-11 ENCOUNTER — Ambulatory Visit (INDEPENDENT_AMBULATORY_CARE_PROVIDER_SITE_OTHER): Payer: BC Managed Care – PPO | Admitting: Family Medicine

## 2014-07-11 ENCOUNTER — Encounter: Payer: Self-pay | Admitting: Family Medicine

## 2014-07-11 VITALS — BP 110/70 | HR 51 | Temp 97.9°F | Resp 16 | Ht 66.0 in | Wt 139.4 lb

## 2014-07-11 DIAGNOSIS — G2581 Restless legs syndrome: Secondary | ICD-10-CM

## 2014-07-11 DIAGNOSIS — Z1211 Encounter for screening for malignant neoplasm of colon: Secondary | ICD-10-CM

## 2014-07-11 DIAGNOSIS — Z0000 Encounter for general adult medical examination without abnormal findings: Secondary | ICD-10-CM

## 2014-07-11 DIAGNOSIS — N951 Menopausal and female climacteric states: Secondary | ICD-10-CM

## 2014-07-11 MED ORDER — IBUPROFEN 800 MG PO TABS: ORAL_TABLET | ORAL | Status: DC

## 2014-07-11 MED ORDER — CLONAZEPAM 0.5 MG PO TABS: 0.5000 mg | ORAL_TABLET | Freq: Every day | ORAL | Status: DC

## 2014-07-11 NOTE — Progress Notes (Signed)
   Subjective:    Patient ID: Robin Mueller, female    DOB: 29-Apr-1961, 53 y.o.   MRN: [id number]  HPI CPE- UTD on mammo, pap- no need due to hysterectomy.  Due for colonoscopy- hx of polyps.   Review of Systems Patient reports no vision, adenopathy,fever, weight change,  persistant/recurrent hoarseness , swallowing issues, chest pain, palpitations, edema, persistant/recurrent cough, hemoptysis, dyspnea (rest/exertional/paroxysmal nocturnal), gastrointestinal bleeding (melena, rectal bleeding), abdominal pain, significant heartburn, bowel changes, GU symptoms (dysuria, hematuria, incontinence), Gyn symptoms (abnormal  bleeding, pain),  syncope, focal weakness, memory loss, numbness & tingling, skin/hair/nail changes, abnormal bruising or bleeding, anxiety, or depression.    + decreased hearing bilaterally- particularly w/ background noise + RLS- pt does not want anything touching legs, will have burning pain, need to get up and move.    Objective:   Physical Exam General Appearance:    Alert, cooperative, no distress, appears stated age  Head:    Normocephalic, without obvious abnormality, atraumatic  Eyes:    PERRL, conjunctiva/corneas clear, EOM's intact, fundi    benign, both eyes  Ears:    Normal TM's and external ear canals, both ears  Nose:   Nares normal, septum midline, mucosa normal, no drainage    or sinus tenderness  Throat:   Lips, mucosa, and tongue normal; teeth and gums normal  Neck:   Supple, symmetrical, trachea midline, no adenopathy;    Thyroid: no enlargement/tenderness/nodules  Back:     Symmetric, no curvature, ROM normal, no CVA tenderness  Lungs:     Clear to auscultation bilaterally, respirations unlabored  Chest Wall:    No tenderness or deformity   Heart:    Regular rate and rhythm, S1 and S2 normal, no murmur, rub   or gallop  Breast Exam:    Deferred to mammo  Abdomen:     Soft, non-tender, bowel sounds active all four quadrants,    no masses, no  organomegaly  Genitalia:    Deferred  Rectal:    Extremities:   Extremities normal, atraumatic, no cyanosis or edema  Pulses:   2+ and symmetric all extremities  Skin:   Skin color, texture, turgor normal, no rashes or lesions  Lymph nodes:   Cervical, supraclavicular, and axillary nodes normal  Neurologic:   CNII-XII intact, normal strength, sensation and reflexes    throughout          Assessment & Plan:

## 2014-07-11 NOTE — Progress Notes (Signed)
Pre visit review using our clinic review tool, if applicable. No additional management support is needed unless otherwise documented below in the visit note. 

## 2014-07-11 NOTE — Patient Instructions (Signed)
Follow up in 1 year or as needed We'll notify you of your lab results and make any changes if needed We'll call you with your GI appt for the colonoscopy Start the Clonazepam nightly for the RLS Call with any questions or concerns Keep up the good work!  You look great! Happy Holidays!

## 2014-07-12 LAB — LIPID PANEL
CHOL/HDL RATIO: 3
Cholesterol: 178 mg/dL (ref 0–200)
HDL: 51 mg/dL (ref >39.00)
LDL Cholesterol: 115 mg/dL — ABNORMAL HIGH (ref 0–99)
NONHDL: 127
Triglycerides: 61 mg/dL (ref 0.0–149.0)
VLDL: 12.2 mg/dL (ref 0.0–40.0)

## 2014-07-12 LAB — TSH: TSH: 2.64 u[IU]/mL (ref 0.35–4.50)

## 2014-07-12 LAB — LUTEINIZING HORMONE: LH: 27.31 m[IU]/mL

## 2014-07-12 LAB — CBC WITH DIFFERENTIAL/PLATELET
Basophils Absolute: 0 10*3/uL (ref 0.0–0.1)
Basophils Relative: 0.5 % (ref 0.0–3.0)
EOS ABS: 0.3 10*3/uL (ref 0.0–0.7)
Eosinophils Relative: 4.3 % (ref 0.0–5.0)
HCT: 38.9 % (ref 36.0–46.0)
Hemoglobin: 12.9 g/dL (ref 12.0–15.0)
LYMPHS PCT: 37.4 % (ref 12.0–46.0)
Lymphs Abs: 2.6 10*3/uL (ref 0.7–4.0)
MCHC: 33.1 g/dL (ref 30.0–36.0)
MCV: 99.8 fl (ref 78.0–100.0)
Monocytes Absolute: 0.6 10*3/uL (ref 0.1–1.0)
Monocytes Relative: 8.1 % (ref 3.0–12.0)
NEUTROS PCT: 49.7 % (ref 43.0–77.0)
Neutro Abs: 3.5 10*3/uL (ref 1.4–7.7)
Platelets: 308 10*3/uL (ref 150.0–400.0)
RBC: 3.9 Mil/uL (ref 3.87–5.11)
RDW: 12.3 % (ref 11.5–15.5)
WBC: 7 10*3/uL (ref 4.0–10.5)

## 2014-07-12 LAB — HEPATIC FUNCTION PANEL
ALT: 17 U/L (ref 0–35)
AST: 20 U/L (ref 0–37)
Albumin: 3.5 g/dL (ref 3.5–5.2)
Alkaline Phosphatase: 36 U/L — ABNORMAL LOW (ref 39–117)
BILIRUBIN DIRECT: 0 mg/dL (ref 0.0–0.3)
TOTAL PROTEIN: 7 g/dL (ref 6.0–8.3)
Total Bilirubin: 0.4 mg/dL (ref 0.2–1.2)

## 2014-07-12 LAB — BASIC METABOLIC PANEL
BUN: 18 mg/dL (ref 6–23)
CO2: 28 meq/L (ref 19–32)
Calcium: 8.7 mg/dL (ref 8.4–10.5)
Chloride: 106 mEq/L (ref 96–112)
Creatinine, Ser: 0.9 mg/dL (ref 0.4–1.2)
GFR: 72.82 mL/min (ref >60.00)
Glucose, Bld: 78 mg/dL (ref 70–99)
POTASSIUM: 3.9 meq/L (ref 3.5–5.1)
Sodium: 138 mEq/L (ref 135–145)

## 2014-07-12 LAB — FOLLICLE STIMULATING HORMONE: FSH: 75.5 m[IU]/mL

## 2014-07-12 LAB — VITAMIN D 25 HYDROXY (VIT D DEFICIENCY, FRACTURES): VITD: 45.75 ng/mL (ref 30.00–100.00)

## 2014-07-14 DIAGNOSIS — G2581 Restless legs syndrome: Secondary | ICD-10-CM | POA: Insufficient documentation

## 2014-07-14 NOTE — Assessment & Plan Note (Signed)
Pt's PE WNL.  UTD on mammo.  Due for colonoscopy- referral entered.  Check labs.  Anticipatory guidance provided.

## 2014-07-14 NOTE — Assessment & Plan Note (Signed)
New.  Start low dose clonopin nightly and monitor for symptom improvement.

## 2014-08-06 ENCOUNTER — Other Ambulatory Visit: Payer: Self-pay | Admitting: General Practice

## 2014-08-06 MED ORDER — OMEPRAZOLE-SODIUM BICARBONATE 40-1100 MG PO CAPS: ORAL_CAPSULE | ORAL | Status: DC

## 2014-08-13 ENCOUNTER — Encounter: Payer: Self-pay | Admitting: Family Medicine

## 2014-09-11 ENCOUNTER — Encounter: Payer: Self-pay | Admitting: Family Medicine

## 2014-09-11 ENCOUNTER — Ambulatory Visit (INDEPENDENT_AMBULATORY_CARE_PROVIDER_SITE_OTHER): Payer: BLUE CROSS/BLUE SHIELD | Admitting: Family Medicine

## 2014-09-11 VITALS — BP 112/78 | HR 64 | Temp 97.9°F | Resp 16 | Wt 140.4 lb

## 2014-09-11 DIAGNOSIS — M6749 Ganglion, multiple sites: Secondary | ICD-10-CM

## 2014-09-11 DIAGNOSIS — IMO0002 Reserved for concepts with insufficient information to code with codable children: Secondary | ICD-10-CM | POA: Insufficient documentation

## 2014-09-11 NOTE — Patient Instructions (Signed)
Follow up as needed Naproxen twice daily- take w/ food  ICE We'll call you with your hand specialist appt Call with any questions or concerns Happy New Year!!

## 2014-09-11 NOTE — Progress Notes (Signed)
Pre visit review using our clinic review tool, if applicable. No additional management support is needed unless otherwise documented below in the visit note. 

## 2014-09-11 NOTE — Progress Notes (Signed)
   Subjective:    Patient ID: Robin Mueller, female    DOB: [birth year], 54 y.o.   MRN: [id number]  HPI R hand cyst- sxs started 3-4 weeks ago.  'a red bump showed up' at base of 1st finger.  Was very sore to touch or use hand.  Pt reports area has enlarged.  Remains painful.  Will intermittently be red.  R hand dominant   Review of Systems For ROS see HPI     Objective:   Physical Exam  Constitutional: She appears well-developed and well-nourished. No distress.  Cardiovascular: Intact distal pulses.   Musculoskeletal: She exhibits tenderness (over small but firm soft tissue mass on palmar surface of R hand at 1st MTP joint).  Skin: Skin is warm and dry.  Vitals reviewed.         Assessment & Plan:

## 2014-09-13 ENCOUNTER — Encounter: Payer: Self-pay | Admitting: Family Medicine

## 2014-09-13 DIAGNOSIS — Z1211 Encounter for screening for malignant neoplasm of colon: Secondary | ICD-10-CM

## 2014-09-13 NOTE — Telephone Encounter (Signed)
Referral placed.

## 2014-09-15 NOTE — Assessment & Plan Note (Signed)
New.  Pt w/ cyst on palmar surface of R hand at base of 1st MTP joint.  Suspect this is a cyst.  Due to fact it has been present for months and pt continues to have pain w/ it, will refer to hand specialist.  Pt expressed understanding and is in agreement w/ plan.

## 2014-10-07 ENCOUNTER — Other Ambulatory Visit: Payer: Self-pay | Admitting: Orthopedic Surgery

## 2014-10-10 LAB — HM MAMMOGRAPHY

## 2014-10-15 ENCOUNTER — Encounter: Payer: Self-pay | Admitting: General Practice

## 2014-11-24 ENCOUNTER — Encounter: Payer: Self-pay | Admitting: Family Medicine

## 2014-11-28 ENCOUNTER — Other Ambulatory Visit: Payer: Self-pay | Admitting: Family Medicine

## 2014-11-28 ENCOUNTER — Encounter (HOSPITAL_BASED_OUTPATIENT_CLINIC_OR_DEPARTMENT_OTHER): Payer: Self-pay | Admitting: *Deleted

## 2014-11-28 NOTE — Progress Notes (Signed)
No labs needed

## 2014-11-28 NOTE — Telephone Encounter (Signed)
Med filled.  

## 2014-12-05 ENCOUNTER — Ambulatory Visit (HOSPITAL_BASED_OUTPATIENT_CLINIC_OR_DEPARTMENT_OTHER): Payer: BC Managed Care – PPO | Admitting: Anesthesiology

## 2014-12-05 ENCOUNTER — Encounter (HOSPITAL_BASED_OUTPATIENT_CLINIC_OR_DEPARTMENT_OTHER)
Admission: RE | Disposition: A | Discharge status: Home / Self Care | Payer: Self-pay | Source: Ambulatory Visit | Attending: Orthopedic Surgery

## 2014-12-05 ENCOUNTER — Encounter (HOSPITAL_BASED_OUTPATIENT_CLINIC_OR_DEPARTMENT_OTHER): Payer: Self-pay | Admitting: Certified Registered"

## 2014-12-05 ENCOUNTER — Ambulatory Visit (HOSPITAL_BASED_OUTPATIENT_CLINIC_OR_DEPARTMENT_OTHER)
Admission: RE | Admit: 2014-12-05 | Discharge: 2014-12-05 | Disposition: A | Discharge status: Home / Self Care | Payer: BC Managed Care – PPO | Source: Ambulatory Visit | Attending: Orthopedic Surgery | Admitting: Orthopedic Surgery

## 2014-12-05 DIAGNOSIS — Z79899 Other long term (current) drug therapy: Secondary | ICD-10-CM | POA: Insufficient documentation

## 2014-12-05 DIAGNOSIS — L720 Epidermal cyst: Secondary | ICD-10-CM | POA: Diagnosis not present

## 2014-12-05 DIAGNOSIS — Z9049 Acquired absence of other specified parts of digestive tract: Secondary | ICD-10-CM | POA: Diagnosis not present

## 2014-12-05 DIAGNOSIS — K219 Gastro-esophageal reflux disease without esophagitis: Secondary | ICD-10-CM | POA: Insufficient documentation

## 2014-12-05 DIAGNOSIS — M1990 Unspecified osteoarthritis, unspecified site: Secondary | ICD-10-CM | POA: Insufficient documentation

## 2014-12-05 DIAGNOSIS — F329 Major depressive disorder, single episode, unspecified: Secondary | ICD-10-CM | POA: Diagnosis not present

## 2014-12-05 DIAGNOSIS — R2231 Localized swelling, mass and lump, right upper limb: Secondary | ICD-10-CM | POA: Diagnosis not present

## 2014-12-05 DIAGNOSIS — Z87891 Personal history of nicotine dependence: Secondary | ICD-10-CM | POA: Diagnosis not present

## 2014-12-05 DIAGNOSIS — L719 Rosacea, unspecified: Secondary | ICD-10-CM | POA: Insufficient documentation

## 2014-12-05 HISTORY — DX: Other specified postprocedural states: Z98.890

## 2014-12-05 HISTORY — DX: Cardiac arrhythmia, unspecified: I49.9

## 2014-12-05 HISTORY — DX: Nausea with vomiting, unspecified: R11.2

## 2014-12-05 HISTORY — PX: MASS EXCISION: SHX2000

## 2014-12-05 HISTORY — DX: Unspecified osteoarthritis, unspecified site: M19.90

## 2014-12-05 LAB — POCT HEMOGLOBIN-HEMACUE: Hemoglobin: 13.7 g/dL (ref 12.0–15.0)

## 2014-12-05 SURGERY — EXCISION MASS
Anesthesia: Monitor Anesthesia Care | Site: Hand | Laterality: Right

## 2014-12-05 MED ORDER — MIDAZOLAM HCL 5 MG/5ML IJ SOLN
INTRAMUSCULAR | Status: DC | PRN
  Administered 2014-12-05: 2 mg via INTRAVENOUS

## 2014-12-05 MED ORDER — PROPOFOL 10 MG/ML IV EMUL
INTRAVENOUS | Status: AC
  Filled 2014-12-05: qty 50

## 2014-12-05 MED ORDER — MIDAZOLAM HCL 2 MG/2ML IJ SOLN
INTRAMUSCULAR | Status: AC
  Filled 2014-12-05: qty 2

## 2014-12-05 MED ORDER — HYDROCODONE-ACETAMINOPHEN 5-325 MG PO TABS
ORAL_TABLET | ORAL | Status: DC
Start: 2014-12-05 — End: 2015-05-22

## 2014-12-05 MED ORDER — FENTANYL CITRATE 0.05 MG/ML IJ SOLN
INTRAMUSCULAR | Status: DC | PRN
  Administered 2014-12-05 (×2): 50 ug via INTRAVENOUS

## 2014-12-05 MED ORDER — LACTATED RINGERS IV SOLN
INTRAVENOUS | Status: DC
  Administered 2014-12-05: 08:00:00 via INTRAVENOUS

## 2014-12-05 MED ORDER — BUPIVACAINE HCL (PF) 0.25 % IJ SOLN
INTRAMUSCULAR | Status: AC
  Filled 2014-12-05: qty 30

## 2014-12-05 MED ORDER — FENTANYL CITRATE 0.05 MG/ML IJ SOLN: 50.0000 ug | INTRAMUSCULAR | Status: DC | PRN

## 2014-12-05 MED ORDER — CHLORHEXIDINE GLUCONATE 4 % EX LIQD: 60.0000 mL | Freq: Once | CUTANEOUS | Status: DC

## 2014-12-05 MED ORDER — MIDAZOLAM HCL 2 MG/2ML IJ SOLN: 1.0000 mg | INTRAMUSCULAR | Status: DC | PRN

## 2014-12-05 MED ORDER — ONDANSETRON HCL 4 MG/2ML IJ SOLN: 4.0000 mg | Freq: Four times a day (QID) | INTRAMUSCULAR | Status: DC | PRN

## 2014-12-05 MED ORDER — VANCOMYCIN HCL IN DEXTROSE 1-5 GM/200ML-% IV SOLN
1000.0000 mg | INTRAVENOUS | Status: AC
  Administered 2014-12-05: 1000 mg via INTRAVENOUS

## 2014-12-05 MED ORDER — OXYCODONE HCL 5 MG/5ML PO SOLN: 5.0000 mg | Freq: Once | ORAL | Status: DC | PRN

## 2014-12-05 MED ORDER — PROPOFOL INFUSION 10 MG/ML OPTIME
INTRAVENOUS | Status: DC | PRN
  Administered 2014-12-05: 100 ug/kg/min via INTRAVENOUS

## 2014-12-05 MED ORDER — DEXAMETHASONE SODIUM PHOSPHATE 4 MG/ML IJ SOLN
INTRAMUSCULAR | Status: DC | PRN
  Administered 2014-12-05: 4 mg via INTRAVENOUS

## 2014-12-05 MED ORDER — VANCOMYCIN HCL IN DEXTROSE 1-5 GM/200ML-% IV SOLN
INTRAVENOUS | Status: AC
  Filled 2014-12-05: qty 200

## 2014-12-05 MED ORDER — FENTANYL CITRATE 0.05 MG/ML IJ SOLN
INTRAMUSCULAR | Status: AC
  Filled 2014-12-05: qty 4

## 2014-12-05 MED ORDER — ONDANSETRON HCL 4 MG/2ML IJ SOLN
INTRAMUSCULAR | Status: DC | PRN
Start: 2014-12-05 — End: 2014-12-05
  Administered 2014-12-05: 4 mg via INTRAVENOUS

## 2014-12-05 MED ORDER — FENTANYL CITRATE 0.05 MG/ML IJ SOLN: 25.0000 ug | INTRAMUSCULAR | Status: DC | PRN

## 2014-12-05 MED ORDER — LIDOCAINE HCL (CARDIAC) 20 MG/ML IV SOLN
INTRAVENOUS | Status: DC | PRN
  Administered 2014-12-05: 25 mg via INTRAVENOUS

## 2014-12-05 MED ORDER — OXYCODONE HCL 5 MG PO TABS: 5.0000 mg | ORAL_TABLET | Freq: Once | ORAL | Status: DC | PRN

## 2014-12-05 MED ORDER — BUPIVACAINE HCL (PF) 0.25 % IJ SOLN
INTRAMUSCULAR | Status: DC | PRN
  Administered 2014-12-05: 5.5 mL

## 2014-12-05 SURGICAL SUPPLY — 54 items
APL SKNCLS STERI-STRIP NONHPOA (GAUZE/BANDAGES/DRESSINGS)
BANDAGE COBAN STERILE 2 (GAUZE/BANDAGES/DRESSINGS) ×1 IMPLANT
BANDAGE ELASTIC 3 VELCRO ST LF (GAUZE/BANDAGES/DRESSINGS) IMPLANT
BENZOIN TINCTURE PRP APPL 2/3 (GAUZE/BANDAGES/DRESSINGS) IMPLANT
BLADE MINI RND TIP GREEN BEAV (BLADE) IMPLANT
BLADE SURG 15 STRL LF DISP TIS (BLADE) ×2 IMPLANT
BLADE SURG 15 STRL SS (BLADE) ×4
BNDG CMPR 9X4 STRL LF SNTH (GAUZE/BANDAGES/DRESSINGS)
BNDG CMPR MD 5X2 ELC HKLP STRL (GAUZE/BANDAGES/DRESSINGS)
BNDG COHESIVE 1X5 TAN STRL LF (GAUZE/BANDAGES/DRESSINGS) IMPLANT
BNDG CONFORM 2 STRL LF (GAUZE/BANDAGES/DRESSINGS) IMPLANT
BNDG ELASTIC 2 VLCR STRL LF (GAUZE/BANDAGES/DRESSINGS) IMPLANT
BNDG ESMARK 4X9 LF (GAUZE/BANDAGES/DRESSINGS) IMPLANT
BNDG GAUZE 1X2.1 STRL (MISCELLANEOUS) IMPLANT
BNDG GAUZE ELAST 4 BULKY (GAUZE/BANDAGES/DRESSINGS) ×1 IMPLANT
BNDG PLASTER X FAST 3X3 WHT LF (CAST SUPPLIES) IMPLANT
BNDG PLSTR 9X3 FST ST WHT (CAST SUPPLIES)
CHLORAPREP W/TINT 26ML (MISCELLANEOUS) ×2 IMPLANT
CORDS BIPOLAR (ELECTRODE) ×2 IMPLANT
COVER BACK TABLE 60X90IN (DRAPES) ×2 IMPLANT
COVER MAYO STAND STRL (DRAPES) ×2 IMPLANT
CUFF TOURNIQUET SINGLE 18IN (TOURNIQUET CUFF) ×2 IMPLANT
DRAPE EXTREMITY T 121X128X90 (DRAPE) ×2 IMPLANT
DRAPE SURG 17X23 STRL (DRAPES) ×2 IMPLANT
GAUZE SPONGE 4X4 12PLY STRL (GAUZE/BANDAGES/DRESSINGS) ×2 IMPLANT
GAUZE XEROFORM 1X8 LF (GAUZE/BANDAGES/DRESSINGS) ×2 IMPLANT
GLOVE BIO SURGEON STRL SZ7.5 (GLOVE) ×2 IMPLANT
GLOVE BIOGEL M STRL SZ7.5 (GLOVE) ×1 IMPLANT
GLOVE BIOGEL PI IND STRL 8 (GLOVE) ×1 IMPLANT
GLOVE BIOGEL PI INDICATOR 8 (GLOVE) ×2
GOWN STRL REUS W/ TWL LRG LVL3 (GOWN DISPOSABLE) ×1 IMPLANT
GOWN STRL REUS W/TWL LRG LVL3 (GOWN DISPOSABLE)
GOWN STRL REUS W/TWL XL LVL3 (GOWN DISPOSABLE) ×2 IMPLANT
NDL HYPO 25X1 1.5 SAFETY (NEEDLE) ×1 IMPLANT
NEEDLE HYPO 25X1 1.5 SAFETY (NEEDLE) ×2 IMPLANT
NS IRRIG 1000ML POUR BTL (IV SOLUTION) ×2 IMPLANT
PACK BASIN DAY SURGERY FS (CUSTOM PROCEDURE TRAY) ×2 IMPLANT
PAD CAST 3X4 CTTN HI CHSV (CAST SUPPLIES) IMPLANT
PAD CAST 4YDX4 CTTN HI CHSV (CAST SUPPLIES) IMPLANT
PADDING CAST ABS 4INX4YD NS (CAST SUPPLIES) ×1
PADDING CAST ABS COTTON 4X4 ST (CAST SUPPLIES) ×1 IMPLANT
PADDING CAST COTTON 3X4 STRL (CAST SUPPLIES)
PADDING CAST COTTON 4X4 STRL (CAST SUPPLIES)
STOCKINETTE 4X48 STRL (DRAPES) ×2 IMPLANT
STRIP CLOSURE SKIN 1/2X4 (GAUZE/BANDAGES/DRESSINGS) IMPLANT
SUT ETHILON 3 0 PS 1 (SUTURE) IMPLANT
SUT ETHILON 4 0 PS 2 18 (SUTURE) ×2 IMPLANT
SUT ETHILON 5 0 P 3 18 (SUTURE)
SUT NYLON ETHILON 5-0 P-3 1X18 (SUTURE) IMPLANT
SUT VIC AB 4-0 P2 18 (SUTURE) IMPLANT
SYR BULB 3OZ (MISCELLANEOUS) ×2 IMPLANT
SYR CONTROL 10ML LL (SYRINGE) ×2 IMPLANT
TOWEL OR 17X24 6PK STRL BLUE (TOWEL DISPOSABLE) ×4 IMPLANT
UNDERPAD 30X30 INCONTINENT (UNDERPADS AND DIAPERS) ×2 IMPLANT

## 2014-12-05 NOTE — Anesthesia Preprocedure Evaluation (Signed)
Anesthesia Evaluation  Patient identified by MRN, date of birth, ID band Patient awake    Reviewed: Allergy & Precautions, NPO status , Patient's Chart, lab work & pertinent test results  History of Anesthesia Complications (+) PONV  Airway Mallampati: II   Neck ROM: full    Dental   Pulmonary former smoker,  breath sounds clear to auscultation        Cardiovascular negative cardio ROS  Rhythm:regular Rate:Normal     Neuro/Psych Depression    GI/Hepatic GERD-  ,  Endo/Other    Renal/GU      Musculoskeletal  (+) Arthritis -,   Abdominal   Peds  Hematology   Anesthesia Other Findings   Reproductive/Obstetrics                             Anesthesia Physical Anesthesia Plan  ASA: II  Anesthesia Plan: MAC   Post-op Pain Management:    Induction: Intravenous  Airway Management Planned: Simple Face Mask  Additional Equipment:   Intra-op Plan:   Post-operative Plan:   Informed Consent: I have reviewed the patients History and Physical, chart, labs and discussed the procedure including the risks, benefits and alternatives for the proposed anesthesia with the patient or authorized representative who has indicated his/her understanding and acceptance.     Plan Discussed with: CRNA, Anesthesiologist and Surgeon  Anesthesia Plan Comments:         Anesthesia Quick Evaluation

## 2014-12-05 NOTE — Anesthesia Procedure Notes (Signed)
Procedure Name: MAC Date/Time: 12/05/2014 8:45 AM Performed by: Curly ShoresRAFT, Blossom Crume W Pre-anesthesia Checklist: Patient identified, Emergency Drugs available, Suction available and Patient being monitored Patient Re-evaluated:Patient Re-evaluated prior to inductionOxygen Delivery Method: Simple face mask Preoxygenation: Pre-oxygenation with 100% oxygen Intubation Type: IV induction Dental Injury: Teeth and Oropharynx as per pre-operative assessment

## 2014-12-05 NOTE — Transfer of Care (Signed)
Immediate Anesthesia Transfer of Care Note  Patient: Robin SolianKathleen A Laino  Procedure(s) Performed: Procedure(s): EXCISION MASS, index finger (Right)  Patient Location: PACU  Anesthesia Type:MAC and Bier block  Level of Consciousness: awake, alert  and oriented  Airway & Oxygen Therapy: Patient Spontanous Breathing, RA O2 100%  Post-op Assessment: Report given to RN, Post -op Vital signs reviewed and stable and Patient moving all extremities  Post vital signs: Reviewed and stable  Last Vitals:  Filed Vitals:   12/05/14 0729  BP: 129/82  Pulse: 57  Temp: 36.6 C  Resp: 18    Complications: No apparent anesthesia complications

## 2014-12-05 NOTE — Op Note (Signed)
[id number] 

## 2014-12-05 NOTE — Anesthesia Postprocedure Evaluation (Signed)
Anesthesia Post Note  Patient: Robin Mueller  Procedure(s) Performed: Procedure(s) (LRB): EXCISION MASS, index finger (Right)  Anesthesia type: MAC  Patient location: PACU  Post pain: Pain level controlled and Adequate analgesia  Post assessment: Post-op Vital signs reviewed, Patient's Cardiovascular Status Stable and Respiratory Function Stable  Last Vitals:  Filed Vitals:   12/05/14 0941  BP:   Pulse: 56  Temp:   Resp: 16    Post vital signs: Reviewed and stable  Level of consciousness: awake, alert  and oriented  Complications: No apparent anesthesia complications

## 2014-12-05 NOTE — Brief Op Note (Signed)
12/05/2014  9:18 AM  PATIENT:  Robin Mueller  54 y.o. female  PRE-OPERATIVE DIAGNOSIS:  mass right index finger  POST-OPERATIVE DIAGNOSIS:  Annular ligament cyst right index finger  PROCEDURE:  Procedure(s): EXCISION MASS, index finger (Right)  SURGEON:  Surgeon(s) and Role:    * Betha LoaKevin Romelle Muldoon, MD - Primary  PHYSICIAN ASSISTANT:   ASSISTANTS: none   ANESTHESIA:   bier block with sedation  EBL:  Total I/O In: 300 [I.V.:300] Out: -   BLOOD ADMINISTERED:none  DRAINS: none   LOCAL MEDICATIONS USED:  MARCAINE     SPECIMEN:  Source of Specimen:  right index  DISPOSITION OF SPECIMEN:  PATHOLOGY  COUNTS:  YES  TOURNIQUET:   Total Tourniquet Time Documented: Forearm (Right) - 28 minutes Total: Forearm (Right) - 28 minutes   DICTATION: .Other Dictation: Dictation Number [id number]  PLAN OF CARE: Discharge to home after PACU  PATIENT DISPOSITION:  PACU - hemodynamically stable.

## 2014-12-05 NOTE — H&P (Signed)
Robin Mueller is an 54 y.o. female.   Chief Complaint: right index finger mass HPI: 54 yo rhd female with mass right index finger x 3 months.  Pain with gripping.  It is bothersome to her.  She wishes to have it removed.  Past Medical History  Diagnosis Date  . Rosacea   . GERD (gastroesophageal reflux disease)   . Depression   . Allergic rhinitis   . PONV (postoperative nausea and vomiting)   . Dysrhythmia     hx pvc,pac  . Arthritis     Past Surgical History  Procedure Laterality Date  . Appendectomy    . Cholecystectomy    . Carpal tunnel release      bilateral  . Tumor excision      left lower leg skin  . Nasal septum surgery    . Right calcaneous surgery    . Vaginal hysterectomy  2000  . Ulnar nerve transposition  2009    right  . Ulna nerve release  2007    right  . Ulna nerve release  2007    left  . Foot surgery  2010    right  . Colonoscopy      Family History  Problem Relation Age of Onset  . Coronary artery disease      grandfather  . Hypertension Father   . Stroke Father   . Colon polyps Father   . Hypertension Mother   . Diabetes Mother   . Colon polyps Mother     precancerous  . Hypertension Brother   . Colon cancer Neg Hx   . Stomach cancer Neg Hx    Social History:  reports that she quit smoking about 16 years ago. She has never used smokeless tobacco. She reports that she drinks about 12.6 oz of alcohol per week. She reports that she does not use illicit drugs.  Allergies:  Allergies  Allergen Reactions  . Amoxicillin     Diarrhea   . Adhesive [Tape] Rash    Medications Prior to Admission  Medication Sig Dispense Refill  . Ascorbic Acid (VITAMIN C) 100 MG CHEW Chew by mouth daily.      Marland Kitchen BIOTIN PO Take by mouth daily.    . Calcium 600-200 MG-UNIT per tablet Take 1 tablet by mouth daily.      . fish oil-omega-3 fatty acids 1000 MG capsule Take 1 g by mouth daily.      . Flaxseed, Linseed, (FLAX SEED OIL) 1000 MG CAPS Take  by mouth daily.      . fluticasone (FLONASE) 50 MCG/ACT nasal spray USE TWO SPRAYS IN EACH NOSTRIL DAILY 16 g 1  . ibuprofen (ADVIL,MOTRIN) 800 MG tablet TAKE ONE TABLET BY MOUTH EVERY EIGHT HOURS AS NEEDED 60 tablet 1  . Melatonin-Black Cohosh-Soy (ESTROVEN NIGHTTIME) 2 MG TABS Take 2 mg by mouth.    Marland Kitchen omeprazole-sodium bicarbonate (ZEGERID) 40-1100 MG per capsule Take one capsule by mouth one time daily 30 capsule 3  . ALPRAZolam (XANAX) 0.25 MG tablet Take 1 tablet (0.25 mg total) by mouth as directed. 1-2 tabs po q6 hours prn anxiety 60 tablet 0  . cetirizine (ZYRTEC) 10 MG tablet Take 10 mg by mouth daily.      . clonazePAM (KLONOPIN) 0.5 MG tablet Take 1 tablet (0.5 mg total) by mouth at bedtime. 30 tablet 1    Results for orders placed or performed during the hospital encounter of 12/05/14 (from the past 48 hour(s))  Hemoglobin-hemacue, POC  Status: None   Collection Time: 12/05/14  7:49 AM  Result Value Ref Range   Hemoglobin 13.7 12.0 - 15.0 g/dL    No results found.   A comprehensive review of systems was negative except for: Eyes: positive for contacts/glasses Musculoskeletal: positive for arthralgias and back pain  Blood pressure 129/82, pulse 57, temperature 97.8 F (36.6 C), temperature source Oral, resp. rate 18, height 5\' 6"  (1.676 m), weight 62.596 kg (138 lb), SpO2 100 %.  General appearance: alert, cooperative and appears stated age Head: Normocephalic, without obvious abnormality, atraumatic Neck: supple, symmetrical, trachea midline Resp: clear to auscultation bilaterally Cardio: regular rate and rhythm GI: non tender Extremities: intact sensation and capillary refill all digits.  +epl/fpl/io.  no wounds. Pulses: 2+ and symmetric Skin: Skin color, texture, turgor normal. No rashes or lesions Neurologic: Grossly normal Incision/Wound: none  Assessment/Plan Right index finger mass.  Non operative and operative treatment options were discussed with the  patient and patient wishes to proceed with operative treatment. Risks, benefits, and alternatives of surgery were discussed and the patient agrees with the plan of care.   Kyuss Hale R 12/05/2014, 8:33 AM

## 2014-12-05 NOTE — Discharge Instructions (Addendum)

## 2014-12-06 NOTE — Op Note (Signed)
[name]:  Mueller, Robin           ACCOUNT NO.:  [id number]  MEDICAL RECORD NO.:  [id number]  LOCATION:                                 FACILITY:  PHYSICIAN:  Betha LoaKevin Herschell Virani, MD             DATE OF BIRTH:  DATE OF PROCEDURE:  12/05/2014 DATE OF DISCHARGE:                              OPERATIVE REPORT   PREOPERATIVE DIAGNOSIS:  Right index finger annular ligament cyst.  POSTOPERATIVE DIAGNOSIS:  Right index finger annular ligament cyst.  PROCEDURE:  Excision of mass right index finger.  SURGEON:  Betha LoaKevin Asa Fath, MD  ASSISTANT:  None.  ANESTHESIA:  Bier block with sedation.  IV FLUIDS:  Per anesthesia flow sheet.  ESTIMATED BLOOD LOSS:  Minimal.  COMPLICATIONS:  None.  SPECIMENS:  Right index finger mass to Pathology.  TOURNIQUET TIME:  28 minutes.  DISPOSITION:  Stable to PACU.  INDICATIONS:  Ms. Robin Mueller is a 54 year old female who was noted approximately 3 months of mass in the right index finger.  This is bothersome to her especially with grip.  She would like to have it removed.  Risks, benefits, and alternatives of surgery were discussed including risk of blood loss, infection, damage to nerves, vessels, tendons, ligaments, bone; failure of surgery; need for additional surgery, complications with wound healing, and recurrence of mass.  She voiced understanding of these risks and elected to proceed.  OPERATIVE COURSE:  After being identified preoperatively by myself, the patient and I agreed upon procedure and site of procedure.  Surgical site was marked.  Risks, benefits, and alternatives of surgery were reviewed, and she wished to proceed.  Surgical consent had been signed. She given IV antibiotics as preoperative antibiotic prophylaxis.  She was transferred to the operating room and placed on the operating room table in supine position with the right upper extremity on arm board. Bier block anesthesia induced by anesthesiologist.  The right upper extremity was  prepped and draped in normal sterile orthopedic fashion. Surgical pause was performed between surgeons, anesthesia, and operating room staff, and all were in agreement as to the patient, procedure, and site of procedure.  Tourniquet at the proximal aspect of the forearm had been inflated for the Bier block.  An incision was made over the volar aspect of the MP joint of the index finger and carried into subcutaneous tissues by spreading technique.  Bipolar electrocautery was used to obtain hemostasis.  The radial and ulnar digital nerves were identified and protected throughout the case.  The sheath was identified.  There was some bulging of the sheath between the A1 and A2 pulleys.  This appeared to be the mass.  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  The mass was removed and sent to Pathology for examination. The A1 pulley was released to try and prevent recurrence of mass.  There was adherence between the FDP and FDS tendons.  This was released. There was some thickened tenosynovium, which was also removed.  The wound was copiously irrigated with sterile saline.  It was closed with 4- 0 nylon in a horizontal mattress fashion.  It was injected  with 6 mL of 0.25% plain Marcaine to aid in postoperative analgesia.  It was then dressed with sterile Xeroform, 4 x 4, and wrapped with a Kerlix and Coban dressing lightly.  Tourniquet was deflated at 28 minutes. Fingertips were pink with brisk capillary refill after deflation of tourniquet.  Operative drapes were broken down.  The patient was awoken from anesthesia safely.  She was transferred back to stretcher and taken to PACU in stable condition.  I will see her back in the office in 1 week for postoperative followup.  We will give her Norco 5/325, 1-2 p.o. q.6 h. p.r.n. pain, dispensed #30.     Betha Loa, MD     KK/MEDQ  D:  12/05/2014  T:  12/06/2014   Job:  [id number]

## 2014-12-15 ENCOUNTER — Encounter: Payer: Self-pay | Admitting: Family Medicine

## 2014-12-16 MED ORDER — DEXLANSOPRAZOLE 30 MG PO CPDR: 30.0000 mg | DELAYED_RELEASE_CAPSULE | Freq: Every day | ORAL | Status: DC

## 2014-12-17 ENCOUNTER — Telehealth: Payer: Self-pay | Admitting: *Deleted

## 2014-12-17 NOTE — Telephone Encounter (Signed)
PA started. Awaiting determination 

## 2014-12-17 NOTE — Telephone Encounter (Signed)
Prior auth for [employer]

## 2014-12-17 NOTE — Telephone Encounter (Signed)
PA approved effective 11/17/2014 through 12/17/2015. Approval letter sent for scanning. [id number]//[id number]

## 2014-12-21 ENCOUNTER — Other Ambulatory Visit: Payer: Self-pay | Admitting: Family Medicine

## 2014-12-23 NOTE — Telephone Encounter (Signed)
Med filled.  

## 2014-12-26 ENCOUNTER — Encounter: Payer: Self-pay | Admitting: Family Medicine

## 2014-12-26 MED ORDER — FLUTICASONE PROPIONATE 50 MCG/ACT NA SUSP: 2.0000 | Freq: Every day | NASAL | Status: DC

## 2014-12-26 NOTE — Telephone Encounter (Signed)
Med filled.  

## 2015-01-20 ENCOUNTER — Ambulatory Visit (INDEPENDENT_AMBULATORY_CARE_PROVIDER_SITE_OTHER): Payer: BC Managed Care – PPO | Admitting: Family Medicine

## 2015-01-20 ENCOUNTER — Encounter: Payer: Self-pay | Admitting: Family Medicine

## 2015-01-20 VITALS — BP 126/82 | HR 64 | Temp 97.9°F | Resp 16 | Wt 139.5 lb

## 2015-01-20 DIAGNOSIS — Z1211 Encounter for screening for malignant neoplasm of colon: Secondary | ICD-10-CM

## 2015-01-20 DIAGNOSIS — R319 Hematuria, unspecified: Secondary | ICD-10-CM

## 2015-01-20 DIAGNOSIS — N3000 Acute cystitis without hematuria: Secondary | ICD-10-CM

## 2015-01-20 DIAGNOSIS — N390 Urinary tract infection, site not specified: Secondary | ICD-10-CM | POA: Insufficient documentation

## 2015-01-20 DIAGNOSIS — H9193 Unspecified hearing loss, bilateral: Secondary | ICD-10-CM | POA: Diagnosis not present

## 2015-01-20 DIAGNOSIS — R300 Dysuria: Secondary | ICD-10-CM | POA: Diagnosis not present

## 2015-01-20 LAB — POCT URINALYSIS DIPSTICK
Bilirubin, UA: NEGATIVE
GLUCOSE UA: NEGATIVE
Leukocytes, UA: NEGATIVE
Nitrite, UA: NEGATIVE
Protein, UA: NEGATIVE
Urobilinogen, UA: NEGATIVE
pH, UA: 5

## 2015-01-20 MED ORDER — ALPRAZOLAM 0.25 MG PO TABS: 0.2500 mg | ORAL_TABLET | ORAL | Status: DC

## 2015-01-20 MED ORDER — CIPROFLOXACIN HCL 500 MG PO TABS: 500.0000 mg | ORAL_TABLET | Freq: Two times a day (BID) | ORAL | Status: DC

## 2015-01-20 NOTE — Progress Notes (Signed)
Pre visit review using our clinic review tool, if applicable. No additional management support is needed unless otherwise documented below in the visit note. 

## 2015-01-20 NOTE — Assessment & Plan Note (Signed)
New.  Refer to audiology for complete evaluation.  Pt expressed understanding and is in agreement w/ plan.

## 2015-01-20 NOTE — Assessment & Plan Note (Signed)
Pt's sxs and UA consistent w/ infxn.  Start abx.  Reviewed supportive care and red flags that should prompt return.  Pt expressed understanding and is in agreement w/ plan.  

## 2015-01-20 NOTE — Progress Notes (Signed)
   Subjective:    Patient ID: Robin SolianKathleen A Mueller, female    DOB: 1961/03/15, 54 y.o.   MRN: [id number]  HPI UTI- having intermittent sxs for the last week.  Had painful intercourse last week, urinary odor, increased frequency.  Yesterday woke w/ painful urination, hesitancy, urgency.  No hematuria.  No fevers.  No CVA tenderness.  Decreased hearing- pt reports that she is constantly having to ask people to repeat themselves and is getting frustrated w/ this.  Works in a noisy environment.  Wanting to have hearing checked.   Review of Systems For ROS see HPI     Objective:   Physical Exam  Constitutional: She appears well-developed and well-nourished. No distress.  Abdominal: Soft. She exhibits no distension. There is tenderness (mild suprapubic tenderness but no CVA tenderness ).  Vitals reviewed.         Assessment & Plan:

## 2015-01-20 NOTE — Patient Instructions (Signed)
Follow up as needed Start the Cipro twice daily for 3 days- take w/ food Drink plenty of fluids Take a decongestant to improve the fluid in the ear We'll refer you to GI and audiology (hearing) Call with any questions or concerns HAPPY BIRTHDAY!!!

## 2015-01-20 NOTE — Addendum Note (Signed)
Addended by: Silvio PateHOMPSON, Mialynn Shelvin D on: 01/20/2015 04:27 PM   Modules accepted: Orders

## 2015-01-22 ENCOUNTER — Encounter: Payer: Self-pay | Admitting: Family Medicine

## 2015-01-22 LAB — URINE CULTURE
Colony Count: NO GROWTH
Organism ID, Bacteria: NO GROWTH

## 2015-02-12 ENCOUNTER — Other Ambulatory Visit: Payer: Self-pay | Admitting: Family Medicine

## 2015-02-12 NOTE — Telephone Encounter (Signed)
Med filled.  

## 2015-03-05 ENCOUNTER — Encounter: Payer: Self-pay | Admitting: Family Medicine

## 2015-03-26 ENCOUNTER — Other Ambulatory Visit: Payer: Self-pay | Admitting: Family Medicine

## 2015-03-26 ENCOUNTER — Encounter: Payer: Self-pay | Admitting: Family Medicine

## 2015-03-26 DIAGNOSIS — K649 Unspecified hemorrhoids: Secondary | ICD-10-CM

## 2015-03-27 ENCOUNTER — Encounter: Payer: Self-pay | Admitting: Internal Medicine

## 2015-04-14 ENCOUNTER — Other Ambulatory Visit: Payer: Self-pay | Admitting: Family Medicine

## 2015-04-14 NOTE — Telephone Encounter (Signed)
Medication filled to pharmacy as requested.   

## 2015-05-19 ENCOUNTER — Encounter: Payer: Self-pay | Admitting: Family Medicine

## 2015-05-19 ENCOUNTER — Other Ambulatory Visit: Payer: Self-pay | Admitting: Family Medicine

## 2015-05-19 MED ORDER — IBUPROFEN 800 MG PO TABS: ORAL_TABLET | ORAL | Status: DC

## 2015-05-19 NOTE — Telephone Encounter (Signed)
Med filled.  

## 2015-05-19 NOTE — Telephone Encounter (Signed)
Medication filled to pharmacy as requested.   

## 2015-05-22 ENCOUNTER — Encounter: Payer: Self-pay | Admitting: Family Medicine

## 2015-05-22 ENCOUNTER — Ambulatory Visit (INDEPENDENT_AMBULATORY_CARE_PROVIDER_SITE_OTHER): Payer: BC Managed Care – PPO | Admitting: Family Medicine

## 2015-05-22 VITALS — BP 106/80 | HR 61 | Temp 97.9°F | Resp 16 | Wt 142.5 lb

## 2015-05-22 DIAGNOSIS — M7062 Trochanteric bursitis, left hip: Secondary | ICD-10-CM

## 2015-05-22 DIAGNOSIS — J0100 Acute maxillary sinusitis, unspecified: Secondary | ICD-10-CM | POA: Diagnosis not present

## 2015-05-22 MED ORDER — CLARITHROMYCIN 500 MG PO TABS: 500.0000 mg | ORAL_TABLET | Freq: Two times a day (BID) | ORAL | Status: DC

## 2015-05-22 NOTE — Assessment & Plan Note (Signed)
New.  Pt's sxs and PE consistent w/ bursitis.  Reviewed use of scheduled NSAIDs (ibuprofen )- w/ food- and need for ice after activity.  If no improvement, pt to call ortho or let me know so I can place referral.  Pt expressed understanding and is in agreement w/ plan.

## 2015-05-22 NOTE — Progress Notes (Signed)
Pre visit review using our clinic review tool, if applicable. No additional management support is needed unless otherwise documented below in the visit note. 

## 2015-05-22 NOTE — Assessment & Plan Note (Signed)
Recurrent issue for pt.  Occurs seasonally in the setting of worsening allergies.  Start abx.  Reviewed supportive care and red flags that should prompt return.  Pt expressed understanding and is in agreement w/ plan.

## 2015-05-22 NOTE — Progress Notes (Signed)
   Subjective:    Patient ID: Robin Mueller, female    DOB: May 02, 1961, 54 y.o.   MRN: [id number]  HPI URI- sxs started 2 weeks ago w/ excessive sneezing.  Thought it was initially just allergies but 7-10 days ago transitioned to something else.  + facial pain, ear fullness, HA, mild nausea.  + upper tooth pain.  Subjective fevers last week.  Hx of similar.  On Zyrtec and Flonase.  No known sick contacts  L hip pain- 'i have done something to my hip'.  'it feels like i bruised it but there's no mark'.  Has hx of L knee pain.  Point tender over greater trochanter.   Review of Systems For ROS see HPI     Objective:   Physical Exam  Constitutional: She is oriented to person, place, and time. She appears well-developed and well-nourished. No distress.  HENT:  Head: Normocephalic and atraumatic.  Right Ear: Tympanic membrane normal.  Left Ear: Tympanic membrane normal.  Nose: Mucosal edema and rhinorrhea present. Right sinus exhibits maxillary sinus tenderness and frontal sinus tenderness. Left sinus exhibits maxillary sinus tenderness and frontal sinus tenderness.  Mouth/Throat: Uvula is midline and mucous membranes are normal. Posterior oropharyngeal erythema present. No oropharyngeal exudate.  Eyes: Conjunctivae and EOM are normal. Pupils are equal, round, and reactive to light.  Neck: Normal range of motion. Neck supple.  Cardiovascular: Normal rate, regular rhythm and normal heart sounds.   Pulmonary/Chest: Effort normal and breath sounds normal. No respiratory distress. She has no wheezes.  Musculoskeletal: She exhibits tenderness (TTP over L greater trochanteric bursa).  Lymphadenopathy:    She has no cervical adenopathy.  Neurological: She is alert and oriented to person, place, and time.  Skin: Skin is warm and dry.  Psychiatric: She has a normal mood and affect. Her behavior is normal. Thought content normal.  Vitals reviewed.         Assessment & Plan:

## 2015-05-22 NOTE — Patient Instructions (Signed)
Follow up as needed Start the Biaxin as directed- take w/ food The hip pain is bursitis and should improve w/ the Ibuprofen and ice If worsening pain or no improvement, call ortho or let me know so we can get you into ortho Call with any questions or concerns Hang in there!!!

## 2015-06-02 ENCOUNTER — Encounter: Payer: Self-pay | Admitting: Family Medicine

## 2015-06-03 ENCOUNTER — Ambulatory Visit (INDEPENDENT_AMBULATORY_CARE_PROVIDER_SITE_OTHER): Payer: BC Managed Care – PPO | Admitting: Internal Medicine

## 2015-06-03 ENCOUNTER — Encounter: Payer: Self-pay | Admitting: Internal Medicine

## 2015-06-03 VITALS — BP 106/68 | HR 64 | Ht 65.0 in | Wt 141.1 lb

## 2015-06-03 DIAGNOSIS — K589 Irritable bowel syndrome without diarrhea: Secondary | ICD-10-CM | POA: Diagnosis not present

## 2015-06-03 DIAGNOSIS — Z1211 Encounter for screening for malignant neoplasm of colon: Secondary | ICD-10-CM

## 2015-06-03 DIAGNOSIS — K648 Other hemorrhoids: Secondary | ICD-10-CM | POA: Diagnosis not present

## 2015-06-03 MED ORDER — SULFAMETHOXAZOLE-TRIMETHOPRIM 800-160 MG PO TABS: 1.0000 | ORAL_TABLET | Freq: Two times a day (BID) | ORAL | Status: DC

## 2015-06-03 MED ORDER — HYDROCORTISONE ACETATE 25 MG RE SUPP: 25.0000 mg | Freq: Every evening | RECTAL | Status: DC | PRN

## 2015-06-03 NOTE — Patient Instructions (Addendum)
Your physician has requested that you go to the basement for the following lab work before leaving today: TTG, IGA   Please call us tomorrow to set up a pre-visit and colonoscopy appointments.  Today you have been given information on hemorrhoid banding.  We have sent the following medications to your pharmacy for you to pick up at your convenience: Rectal suppositories   I appreciate the opportunity to care for you. Stan Head, MD, [facility]

## 2015-06-03 NOTE — Progress Notes (Signed)
   Subjective:    Patient ID: Robin Mueller, female    DOB: 1960-10-08, 54 y.o.   MRN: [id number] Cc: hemorrhoids, IBS HPI Very nice woman w/ IBS sxs x years and intermittent swelling and bleeding from hemorrhoids  Has had hemorrhoids since childbirth. Mild anal discomfort. Some rectal bleeding on paper and into commode. Seems worse in summer especially after 3 mile runs.   Bowel habits are variable with no stool x 2-3 days then loose urgent stool with rare incontinence other days. Pattern stable x years. Family members have same issues. No colitis on prior colonoscopy exams.  Also has predefecatory abdominal cramps. Medications, allergies, past medical history, past surgical history, family history and social history are reviewed and updated in the EMR.  Review of Systems + allergies, joint pains, bursitis, fatigue, headaches, decreased hearing, insomnia w/ night sweats, pedal edema All other negative or per hpi    Objective:   Physical Exam  106/68 mmHg  Pulse 64  Ht  (1.651 m)  Wt 141 lb 2 oz (64.014 kg)  BMI 23.48 kg/m2@  General:  Well-developed, well-nourished and in no acute distress Eyes:  anicteric. ENT:   Mouth and posterior pharynx free of lesions.  Neck:   supple w/o thyromegaly or mass.  Lungs: Clear to auscultation bilaterally. Heart:  S1S2, no rubs, murmurs, gallops. Abdomen:  soft, non-tender, no hepatosplenomegaly, hernia, or mass and BS+.  Rectal:  Robin Mueller, CMA present.  Small RA anal tag otherwise normal anoderm NL resting tone, no mass, rectocele Non-tender   Anoscopy was performed with the patient in the left lateral decubitus position while a chaperone was present and revealed Gr 1-2 internal hemorrhoids, inflamed all positions   Lymph:  no cervical or supraclavicular adenopathy. Extremities:   no edema, cyanosis or clubbing Skin   no rash. Neuro:  A&O x 3.  Psych:  appropriate mood and  Affect.   Data Reviewed: 2007  colonoscopy 2015 and 2016 labs in EMR    Assessment & Plan:  IBS (irritable bowel syndrome)  Colon cancer screening  Hemorrhoids, internal, with bleeding    Sounds like chronic IBS with symptomatic hemorrhoids. Will screen for celiac dz with TTG Ab and IgA level Screening colonoscopy will be preformed - The risks and benefits as well as alternatives of endoscopic procedure(s) have been discussed and reviewed. All questions answered. The patient agrees to proceed.  Anusol HC suppositories prn hemorrhoids and consider banding  Look at medications that may be aggravating stool pattern - flaxseed, fish oil are possibilities  I appreciate the opportunity to care for this patient. Cc;Robin Beverely Low, MD

## 2015-06-04 ENCOUNTER — Encounter: Payer: Self-pay | Admitting: Internal Medicine

## 2015-06-05 ENCOUNTER — Other Ambulatory Visit (INDEPENDENT_AMBULATORY_CARE_PROVIDER_SITE_OTHER): Payer: BC Managed Care – PPO

## 2015-06-05 DIAGNOSIS — K589 Irritable bowel syndrome without diarrhea: Secondary | ICD-10-CM

## 2015-06-05 LAB — IGA: IgA: 125 mg/dL (ref 68–378)

## 2015-06-06 LAB — TISSUE TRANSGLUTAMINASE, IGA: Tissue Transglutaminase Ab, IgA: 1 U/mL (ref <4)

## 2015-06-08 NOTE — Progress Notes (Signed)
Quick Note:  Does not have celiac disease ______

## 2015-06-09 ENCOUNTER — Encounter: Payer: Self-pay | Admitting: Internal Medicine

## 2015-06-10 ENCOUNTER — Other Ambulatory Visit: Payer: Self-pay | Admitting: Family Medicine

## 2015-06-10 NOTE — Telephone Encounter (Signed)
Medication filled to pharmacy as requested.   

## 2015-07-11 ENCOUNTER — Telehealth: Payer: Self-pay

## 2015-07-11 NOTE — Telephone Encounter (Signed)
Pr- Visit call completed. 

## 2015-07-14 ENCOUNTER — Encounter: Payer: Self-pay | Admitting: Family Medicine

## 2015-07-14 ENCOUNTER — Ambulatory Visit (INDEPENDENT_AMBULATORY_CARE_PROVIDER_SITE_OTHER): Payer: BC Managed Care – PPO | Admitting: Family Medicine

## 2015-07-14 VITALS — BP 100/68 | HR 51 | Temp 98.2°F | Resp 16 | Ht 65.0 in | Wt 142.5 lb

## 2015-07-14 DIAGNOSIS — Z0000 Encounter for general adult medical examination without abnormal findings: Secondary | ICD-10-CM

## 2015-07-14 NOTE — Progress Notes (Signed)
Pre visit review using our clinic review tool, if applicable. No additional management support is needed unless otherwise documented below in the visit note/SLS  

## 2015-07-14 NOTE — Patient Instructions (Signed)
Follow up in 1 year or as needed We'll notify you of your lab results and make any changes if needed Keep up the good work on healthy diet and regular exercise- you look great! Keep track of your breast pain- when it occurs, what bra you are wearing, how long it lasts, etc Call with any questions or concerns If you want to join us at the new [place] office, any scheduled appointments will automatically transfer and we will see you at [address], [place], [place] [id number]  Happy Holidays!!!

## 2015-07-14 NOTE — Progress Notes (Signed)
   Subjective:    Patient ID: Robin Mueller, female    DOB: 09-18-1960, 54 y.o.   MRN: [id number]  HPI CPE- no need for pap due to hysterectomy, UTD on mammo, colonoscopy scheduled.   Review of Systems Patient reports no vision/ hearing changes, adenopathy,fever, weight change,  persistant/recurrent hoarseness , swallowing issues, chest pain, palpitations, edema, persistant/recurrent cough, hemoptysis, dyspnea (rest/exertional/paroxysmal nocturnal), gastrointestinal bleeding (melena, rectal bleeding), abdominal pain, significant heartburn, bowel changes, GU symptoms (dysuria, hematuria, incontinence), Gyn symptoms (abnormal  bleeding, pain),  syncope, focal weakness, memory loss, numbness & tingling, skin/hair/nail changes, abnormal bruising or bleeding, anxiety, or depression.     Objective:   Physical Exam General Appearance:    Alert, cooperative, no distress, appears stated age  Head:    Normocephalic, without obvious abnormality, atraumatic  Eyes:    PERRL, conjunctiva/corneas clear, EOM's intact, fundi    benign, both eyes  Ears:    Normal TM's and external ear canals, both ears  Nose:   Nares normal, septum midline, mucosa normal, no drainage    or sinus tenderness  Throat:   Lips, mucosa, and tongue normal; teeth and gums normal  Neck:   Supple, symmetrical, trachea midline, no adenopathy;    Thyroid: no enlargement/tenderness/nodules  Back:     Symmetric, no curvature, ROM normal, no CVA tenderness  Lungs:     Clear to auscultation bilaterally, respirations unlabored  Chest Wall:    No tenderness or deformity   Heart:    Regular rate and rhythm, S1 and S2 normal, no murmur, rub   or gallop  Breast Exam:    Deferred to GYN  Abdomen:     Soft, non-tender, bowel sounds active all four quadrants,    no masses, no organomegaly  Genitalia:    Deferred to GYN  Rectal:    Extremities:   Extremities normal, atraumatic, no cyanosis or edema  Pulses:   2+ and symmetric all  extremities  Skin:   Skin color, texture, turgor normal, no rashes or lesions  Lymph nodes:   Cervical, supraclavicular, and axillary nodes normal  Neurologic:   CNII-XII intact, normal strength, sensation and reflexes    throughout          Assessment & Plan:

## 2015-07-14 NOTE — Assessment & Plan Note (Signed)
Pt's PE WNL.  UTD on mammo.  Has colonoscopy scheduled.  No need for pap.  Check labs.  Anticipatory guidance provided.

## 2015-07-15 LAB — LIPID PANEL
CHOL/HDL RATIO: 3
Cholesterol: 217 mg/dL — ABNORMAL HIGH (ref 0–200)
HDL: 62.3 mg/dL (ref >39.00)
LDL CALC: 124 mg/dL — AB (ref 0–99)
NonHDL: 155.14
Triglycerides: 156 mg/dL — ABNORMAL HIGH (ref 0.0–149.0)
VLDL: 31.2 mg/dL (ref 0.0–40.0)

## 2015-07-15 LAB — CBC WITH DIFFERENTIAL/PLATELET
Basophils Absolute: 0.1 10*3/uL (ref 0.0–0.1)
Basophils Relative: 0.8 % (ref 0.0–3.0)
EOS ABS: 0.4 10*3/uL (ref 0.0–0.7)
EOS PCT: 5.3 % — AB (ref 0.0–5.0)
HCT: 39 % (ref 36.0–46.0)
HEMOGLOBIN: 13 g/dL (ref 12.0–15.0)
LYMPHS ABS: 2.9 10*3/uL (ref 0.7–4.0)
Lymphocytes Relative: 35.3 % (ref 12.0–46.0)
MCHC: 33.4 g/dL (ref 30.0–36.0)
MCV: 97.5 fl (ref 78.0–100.0)
MONO ABS: 0.6 10*3/uL (ref 0.1–1.0)
Monocytes Relative: 7.2 % (ref 3.0–12.0)
NEUTROS PCT: 51.4 % (ref 43.0–77.0)
Neutro Abs: 4.2 10*3/uL (ref 1.4–7.7)
Platelets: 312 10*3/uL (ref 150.0–400.0)
RBC: 4 Mil/uL (ref 3.87–5.11)
RDW: 12.7 % (ref 11.5–15.5)
WBC: 8.2 10*3/uL (ref 4.0–10.5)

## 2015-07-15 LAB — HEPATIC FUNCTION PANEL
ALBUMIN: 4.4 g/dL (ref 3.5–5.2)
ALK PHOS: 41 U/L (ref 39–117)
ALT: 19 U/L (ref 0–35)
AST: 23 U/L (ref 0–37)
Bilirubin, Direct: 0.1 mg/dL (ref 0.0–0.3)
Total Bilirubin: 0.4 mg/dL (ref 0.2–1.2)
Total Protein: 7.2 g/dL (ref 6.0–8.3)

## 2015-07-15 LAB — BASIC METABOLIC PANEL
BUN: 16 mg/dL (ref 6–23)
CO2: 28 meq/L (ref 19–32)
CREATININE: 0.81 mg/dL (ref 0.40–1.20)
Calcium: 9.5 mg/dL (ref 8.4–10.5)
Chloride: 103 mEq/L (ref 96–112)
GFR: 78.77 mL/min (ref >60.00)
GLUCOSE: 86 mg/dL (ref 70–99)
Potassium: 4 mEq/L (ref 3.5–5.1)
Sodium: 139 mEq/L (ref 135–145)

## 2015-07-15 LAB — TSH: TSH: 3.43 u[IU]/mL (ref 0.35–4.50)

## 2015-07-15 LAB — VITAMIN D 25 HYDROXY (VIT D DEFICIENCY, FRACTURES): VITD: 39.86 ng/mL (ref 30.00–100.00)

## 2015-07-21 ENCOUNTER — Ambulatory Visit (AMBULATORY_SURGERY_CENTER): Payer: Self-pay | Admitting: *Deleted

## 2015-07-21 VITALS — Ht 66.0 in | Wt 142.0 lb

## 2015-07-21 DIAGNOSIS — K589 Irritable bowel syndrome without diarrhea: Secondary | ICD-10-CM

## 2015-07-21 DIAGNOSIS — Z86010 Personal history of colonic polyps: Secondary | ICD-10-CM

## 2015-07-21 NOTE — Progress Notes (Signed)
Patient denies any allergies to egg or soy products. Patient denies complications with anesthesia/sedation.  Patient denies oxygen use at home and denies diet medications. Emmi instructions for colonoscopy explained but patient denied.     

## 2015-07-22 ENCOUNTER — Other Ambulatory Visit: Payer: Self-pay | Admitting: Family Medicine

## 2015-07-24 ENCOUNTER — Encounter: Payer: Self-pay | Admitting: Internal Medicine

## 2015-08-04 ENCOUNTER — Encounter: Payer: Self-pay | Admitting: Internal Medicine

## 2015-08-04 ENCOUNTER — Ambulatory Visit (AMBULATORY_SURGERY_CENTER): Payer: BC Managed Care – PPO | Admitting: Internal Medicine

## 2015-08-04 VITALS — BP 100/59 | HR 55 | Temp 97.1°F | Resp 14 | Ht 66.0 in | Wt 142.0 lb

## 2015-08-04 DIAGNOSIS — Z1211 Encounter for screening for malignant neoplasm of colon: Secondary | ICD-10-CM

## 2015-08-04 MED ORDER — SODIUM CHLORIDE 0.9 % IV SOLN: 500.0000 mL | INTRAVENOUS | Status: DC

## 2015-08-04 NOTE — Patient Instructions (Addendum)
The colonoscopy was normal. Next routine colonoscopy/screening in 10 years - 2026.  You may find you have less frequent stools if you reduce or stop flaxseed and/or fish oil.  I appreciate the opportunity to care for you. Iva Booparl E. Gessner, MD, [facility]  Discharge instructions given. Normal exam. Resume previous medications. YOU HAD AN ENDOSCOPIC PROCEDURE TODAY AT THE [facility]:   Refer to the procedure report that was given to you for any specific questions about what was found during the examination.  If the procedure report does not answer your questions, please call your gastroenterologist to clarify.  If you requested that your care partner not be given the details of your procedure findings, then the procedure report has been included in a sealed envelope for you to review at your convenience later.  YOU SHOULD EXPECT: Some feelings of bloating in the abdomen. Passage of more gas than usual.  Walking can help get rid of the air that was put into your GI tract during the procedure and reduce the bloating. If you had a lower endoscopy (such as a colonoscopy or flexible sigmoidoscopy) you may notice spotting of blood in your stool or on the toilet paper. If you underwent a bowel prep for your procedure, you may not have a normal bowel movement for a few days.  Please Note:  You might notice some irritation and congestion in your nose or some drainage.  This is from the oxygen used during your procedure.  There is no need for concern and it should clear up in a day or so.  SYMPTOMS TO REPORT IMMEDIATELY:   Following lower endoscopy (colonoscopy or flexible sigmoidoscopy):  Excessive amounts of blood in the stool  Significant tenderness or worsening of abdominal pains  Swelling of the abdomen that is new, acute  Fever of 100F or higher   For urgent or emergent issues, a gastroenterologist can be reached at any hour by calling [phone].   DIET: Your first meal  following the procedure should be a small meal and then it is ok to progress to your normal diet. Heavy or fried foods are harder to digest and may make you feel nauseous or bloated.  Likewise, meals heavy in dairy and vegetables can increase bloating.  Drink plenty of fluids but you should avoid alcoholic beverages for 24 hours.  ACTIVITY:  You should plan to take it easy for the rest of today and you should NOT DRIVE or use heavy machinery until tomorrow (because of the sedation medicines used during the test).    FOLLOW UP: Our staff will call the number listed on your records the next business day following your procedure to check on you and address any questions or concerns that you may have regarding the information given to you following your procedure. If we do not reach you, we will leave a message.  However, if you are feeling well and you are not experiencing any problems, there is no need to return our call.  We will assume that you have returned to your regular daily activities without incident.  If any biopsies were taken you will be contacted by phone or by letter within the next 1-3 weeks.  Please call us at [phone] if you have not heard about the biopsies in 3 weeks.    SIGNATURES/CONFIDENTIALITY: You and/or your care partner have signed paperwork which will be entered into your electronic medical record.  These signatures attest to the fact that that the  information above on your After Visit Summary has been reviewed and is understood.  Full responsibility of the confidentiality of this discharge information lies with you and/or your care-partner.

## 2015-08-04 NOTE — Progress Notes (Signed)
Stable to RR 

## 2015-08-04 NOTE — Op Note (Signed)
[facility] [address] [place], [id number]   COLONOSCOPY PROCEDURE REPORT  PATIENT: Robin Mueller, Robin Mueller  MR#: [id number] BIRTHDATE: 1961/01/17 , 54  yrs. old GENDER: female ENDOSCOPIST: Iva Booparl E Orhan Mayorga, MD, [facility] PROCEDURE DATE:  08/04/2015 PROCEDURE:   Colonoscopy, screening First Screening Colonoscopy - Avg.  risk and is 50 yrs.  old or older - No.  Prior Negative Screening - Now for repeat screening. N/Mueller  History of Adenoma - Now for follow-up colonoscopy & has been > or = to 3 yrs.  N/Mueller  Polyps removed today? No Recommend repeat exam, <10 yrs? No ASA CLASS:   Class II INDICATIONS:Screening for colonic neoplasia and Colorectal Neoplasm Risk Assessment for this procedure is average risk. MEDICATIONS: Propofol 200 mg IV, Monitored anesthesia care, and Lidocaine 40 mg  DESCRIPTION OF PROCEDURE:   After the risks benefits and alternatives of the procedure were thoroughly explained, informed consent was obtained.  The digital rectal exam revealed no abnormalities of the rectum.   The LB [id number]  endoscope was introduced through the anus and advanced to the cecum, which was identified by both the appendix and ileocecal valve. No adverse events experienced.   The quality of the prep was excellent. (MiraLax was used)  The instrument was then slowly withdrawn as the colon was fully examined. Estimated blood loss is zero unless otherwise noted in this procedure report.      COLON FINDINGS: Mueller normal appearing cecum, ileocecal valve, and appendiceal orifice were identified.  The ascending, transverse, descending, sigmoid colon, and rectum appeared unremarkable. Retroflexed views revealed no abnormalities. The time to cecum = 3.6 Withdrawal time = 6.3   The scope was withdrawn and the procedure completed. COMPLICATIONS: There were no immediate complications.  ENDOSCOPIC IMPRESSION: Normal colonoscopy - excellent prep  RECOMMENDATIONS: 1.  Repeat  colonoscopy 10 years. 2.  Reduce/stop flaxseed and/or fish oil to reduce stool frequency  eSigned:  Iva Booparl E Safiyya Stokes, MD, [facility] 08/04/2015 11:15 AM   cc: The Patient

## 2015-08-05 ENCOUNTER — Telehealth: Payer: Self-pay | Admitting: *Deleted

## 2015-08-05 NOTE — Telephone Encounter (Signed)
  Follow up Call-  Call back number 08/04/2015  Post procedure Call Back phone  # [phone]  Permission to leave phone message Yes     Patient questions:  Do you have a fever, pain , or abdominal swelling? No. Pain Score  0 *  Have you tolerated food without any problems? Yes.    Have you been able to return to your normal activities? Yes.    Do you have any questions about your discharge instructions: Diet   No. Medications  No. Follow up visit  No.  Do you have questions or concerns about your Care? No.  Actions: * If pain score is 4 or above: No action needed, pain <4.

## 2015-08-07 ENCOUNTER — Encounter: Payer: Self-pay | Admitting: Family Medicine

## 2015-08-07 ENCOUNTER — Ambulatory Visit (INDEPENDENT_AMBULATORY_CARE_PROVIDER_SITE_OTHER): Payer: BC Managed Care – PPO | Admitting: Family Medicine

## 2015-08-07 VITALS — BP 122/82 | HR 66 | Temp 98.1°F | Resp 16 | Ht 66.0 in | Wt 141.1 lb

## 2015-08-07 DIAGNOSIS — J209 Acute bronchitis, unspecified: Secondary | ICD-10-CM | POA: Insufficient documentation

## 2015-08-07 DIAGNOSIS — J40 Bronchitis, not specified as acute or chronic: Secondary | ICD-10-CM

## 2015-08-07 MED ORDER — ALBUTEROL SULFATE HFA 108 (90 BASE) MCG/ACT IN AERS: 2.0000 | INHALATION_SPRAY | Freq: Four times a day (QID) | RESPIRATORY_TRACT | Status: DC | PRN

## 2015-08-07 MED ORDER — AZITHROMYCIN 250 MG PO TABS: ORAL_TABLET | ORAL | Status: DC

## 2015-08-07 MED ORDER — DEXLANSOPRAZOLE 30 MG PO CPDR: DELAYED_RELEASE_CAPSULE | ORAL | Status: DC

## 2015-08-07 MED ORDER — PROMETHAZINE-DM 6.25-15 MG/5ML PO SYRP: 5.0000 mL | ORAL_SOLUTION | Freq: Four times a day (QID) | ORAL | Status: DC | PRN

## 2015-08-07 NOTE — Patient Instructions (Signed)
Follow up as needed Start the Zpack as directed Drink plenty of fluids REST! Use the cough syrup as needed- will cause drowsiness Mucinex DM for daytime cough Use the albuterol inhaler- 2 puffs every 4 hrs as needed for cough/wheezing Call with any questions or concerns Happy Holidays!!!

## 2015-08-07 NOTE — Progress Notes (Signed)
Pre visit review using our clinic review tool, if applicable. No additional management support is needed unless otherwise documented below in the visit note. 

## 2015-08-07 NOTE — Assessment & Plan Note (Signed)
New.  Pt has hx of childhood asthma.  Again having chest tightness, wheezing.  Start Zpack to cover for atypicals.  Cough meds prn.  Albuterol for chest tightness/wheezing.  Reviewed supportive care and red flags that should prompt return.  Pt expressed understanding and is in agreement w/ plan.

## 2015-08-07 NOTE — Progress Notes (Signed)
   Subjective:    Patient ID: Robin Mueller, female    DOB: 12/23/1960, 54 y.o.   MRN: [id number]  HPI URI- sxs started 11/20.  Was exposed to smoke 11/23 and thought that was the cause of her chest tightness but this is persisting.  + nasal congestion, PND, cough- initially dry and nonproductive.  Hx of asthma as a child.  + rattling in chest.  No SOB.  Subjective low grade temps.  + HA.  No sinus pain/pressure.  + sick contacts.   Review of Systems For ROS see HPI     Objective:   Physical Exam  Constitutional: She appears well-developed and well-nourished. No distress.  HENT:  Head: Normocephalic and atraumatic.  TMs normal bilaterally Mild nasal congestion Throat w/out erythema, edema, or exudate  Eyes: Conjunctivae and EOM are normal. Pupils are equal, round, and reactive to light.  Neck: Normal range of motion. Neck supple.  Cardiovascular: Normal rate, regular rhythm, normal heart sounds and intact distal pulses.   No murmur heard. Pulmonary/Chest: Effort normal and breath sounds normal. No respiratory distress. She has no wheezes.  + hacking cough  Lymphadenopathy:    She has no cervical adenopathy.  Vitals reviewed.         Assessment & Plan:

## 2015-08-18 ENCOUNTER — Encounter: Payer: Self-pay | Admitting: Family Medicine

## 2015-08-18 MED ORDER — PREDNISONE 10 MG PO TABS: ORAL_TABLET | ORAL | Status: DC

## 2015-09-29 ENCOUNTER — Encounter: Payer: Self-pay | Admitting: Family Medicine

## 2015-09-29 DIAGNOSIS — J329 Chronic sinusitis, unspecified: Secondary | ICD-10-CM

## 2015-09-30 NOTE — Telephone Encounter (Signed)
Referral placed.

## 2015-10-13 LAB — HM MAMMOGRAPHY

## 2015-10-17 ENCOUNTER — Encounter: Payer: Self-pay | Admitting: General Practice

## 2015-11-04 ENCOUNTER — Other Ambulatory Visit: Payer: Self-pay | Admitting: Family Medicine

## 2015-11-04 NOTE — Telephone Encounter (Signed)
Medication filled to pharmacy as requested.   

## 2016-01-02 ENCOUNTER — Other Ambulatory Visit: Payer: Self-pay | Admitting: General Practice

## 2016-01-02 MED ORDER — FLUTICASONE PROPIONATE 50 MCG/ACT NA SUSP: NASAL | Status: DC

## 2016-01-12 ENCOUNTER — Ambulatory Visit (INDEPENDENT_AMBULATORY_CARE_PROVIDER_SITE_OTHER): Payer: BC Managed Care – PPO | Admitting: Internal Medicine

## 2016-01-12 ENCOUNTER — Encounter: Payer: Self-pay | Admitting: Internal Medicine

## 2016-01-12 ENCOUNTER — Other Ambulatory Visit: Payer: Self-pay | Admitting: General Practice

## 2016-01-12 ENCOUNTER — Other Ambulatory Visit (INDEPENDENT_AMBULATORY_CARE_PROVIDER_SITE_OTHER): Payer: BC Managed Care – PPO

## 2016-01-12 VITALS — BP 112/76 | HR 51 | Ht 66.0 in | Wt 141.6 lb

## 2016-01-12 DIAGNOSIS — J302 Other seasonal allergic rhinitis: Secondary | ICD-10-CM

## 2016-01-12 DIAGNOSIS — J309 Allergic rhinitis, unspecified: Secondary | ICD-10-CM | POA: Diagnosis not present

## 2016-01-12 DIAGNOSIS — J3089 Other allergic rhinitis: Secondary | ICD-10-CM

## 2016-01-12 LAB — CBC WITH DIFFERENTIAL/PLATELET
Basophils Absolute: 0 10*3/uL (ref 0.0–0.1)
Basophils Relative: 0.7 % (ref 0.0–3.0)
EOS ABS: 0.4 10*3/uL (ref 0.0–0.7)
EOS PCT: 5 % (ref 0.0–5.0)
HCT: 38.7 % (ref 36.0–46.0)
HEMOGLOBIN: 13.1 g/dL (ref 12.0–15.0)
Lymphocytes Relative: 34.3 % (ref 12.0–46.0)
Lymphs Abs: 2.6 10*3/uL (ref 0.7–4.0)
MCHC: 33.9 g/dL (ref 30.0–36.0)
MCV: 95.9 fl (ref 78.0–100.0)
MONO ABS: 0.5 10*3/uL (ref 0.1–1.0)
Monocytes Relative: 6.5 % (ref 3.0–12.0)
Neutro Abs: 4 10*3/uL (ref 1.4–7.7)
Neutrophils Relative %: 53.5 % (ref 43.0–77.0)
Platelets: 297 10*3/uL (ref 150.0–400.0)
RBC: 4.04 Mil/uL (ref 3.87–5.11)
RDW: 12.8 % (ref 11.5–15.5)
WBC: 7.5 10*3/uL (ref 4.0–10.5)

## 2016-01-12 MED ORDER — AZELASTINE-FLUTICASONE 137-50 MCG/ACT NA SUSP: 1.0000 | Freq: Every day | NASAL | Status: DC

## 2016-01-12 MED ORDER — ALPRAZOLAM 0.25 MG PO TABS: 0.2500 mg | ORAL_TABLET | ORAL | Status: DC

## 2016-01-12 NOTE — Telephone Encounter (Signed)
Medication filled to pharmacy as requested.   

## 2016-01-12 NOTE — Progress Notes (Signed)
01/12/2016-55 year old female former smoker referred courtesy of Dr Beverely Low; year round allergies; keeps sinus infections. No allergy lab work on file. Since childhood has had mainly allergic rhinitis less eye irritation. Asthma only as a child. Originally seasonal-spring and fall but now more perennial symptoms. No difference between [place] where she lived before moving here 12 years ago. Allergy skin testing and allergy vaccine as a child remembering positives for dust and mold at that time. Family history of hayfever. ENT surgery for septoplasty and "cleaned out my sinuses", with positive history of nasal polyps in the past. She has an albuterol rescue inhaler almost every used, given when she had an acute bronchitis. 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 No significant problems with insects, foods, aspirin. She does avoid dairy products because of IBS symptoms. Uses Zyrtec when necessary, Flonase daily year round. Quit smoking 17 years ago.  Prior to Admission medications   Medication Sig Start Date End Date Taking? Authorizing Provider  albuterol (PROVENTIL HFA;VENTOLIN HFA) 108 (90 BASE) MCG/ACT inhaler Inhale 2 puffs into the lungs every 6 (six) hours as needed for wheezing or shortness of breath. 08/07/15  Yes Sheliah Hatch, MD  ALPRAZolam Prudy Feeler) 0.25 MG tablet Take 1 tablet (0.25 mg total) by mouth as directed. 1-2 tabs po q6 hours prn anxiety 01/12/16  Yes Sheliah Hatch, MD  Ascorbic Acid (VITAMIN C) 100 MG CHEW Chew by mouth daily.     Yes Historical Provider, MD  b complex vitamins tablet Take 1 tablet by mouth daily.   Yes Historical Provider, MD  BIOTIN PO Take by mouth daily.   Yes Historical Provider, MD  Calcium 600-200 MG-UNIT per tablet Take 1 tablet by mouth daily.     Yes Historical Provider, MD  cetirizine (ZYRTEC) 10 MG tablet Take 10 mg by mouth daily.     Yes Historical  Provider, MD  Dexlansoprazole (DEXILANT) 30 MG capsule TAKE 1 CAPSULE (30 MG TOTAL) BY MOUTH DAILY. 08/07/15  Yes Sheliah Hatch, MD  Fexofenadine HCl [facility] ALLERGY PO) Take by mouth as needed.   Yes Historical Provider, MD  fish oil-omega-3 fatty acids 1000 MG capsule Take 1 g by mouth daily.     Yes Historical Provider, MD  Flaxseed, Linseed, (FLAX SEED OIL) 1000 MG CAPS Take by mouth daily.     Yes Historical Provider, MD  fluticasone (FLONASE) 50 MCG/ACT nasal spray PLACE 2 SPRAYS INTO BOTH NOSTRILS DAILY. 01/02/16  Yes Sheliah Hatch, MD  ibuprofen (ADVIL,MOTRIN) 800 MG tablet TAKE ONE TABLET BY MOUTH EVERY EIGHT HOURS AS NEEDED 07/22/15  Yes Sheliah Hatch, MD  TURMERIC PO Take by mouth.   Yes Historical Provider, MD  Azelastine-Fluticasone (DYMISTA) 137-50 MCG/ACT SUSP Place 1-2 puffs into the nose daily. 01/12/16   Waymon Budge, MD   Past Medical History  Diagnosis Date  . Rosacea   . GERD (gastroesophageal reflux disease)   . Depression   . Allergic rhinitis   . PONV (postoperative nausea and vomiting)   . Dysrhythmia     hx pvc,pac  . Anxiety   . Colon polyp   . IBS (irritable bowel syndrome)     diet controlled  . Arthritis     hands, feet,back - otc med prn  . Allergy    Past Surgical History  Procedure Laterality Date  . Appendectomy    . Cholecystectomy    . Carpal tunnel release  Bilateral     bilateral  . Tumor excision Left     left lower leg skin  . Nasal septum surgery    . Right calcaneous surgery    . Vaginal hysterectomy  2000  . Ulnar nerve transposition Right 2009  . Ulna nerve release Right 2007  . Ulna nerve release Left 2007  . Foot neuroma surgery Right 2010  . Mass excision Right 12/05/2014    Procedure: EXCISION MASS, index finger;  Surgeon: Betha Loa, MD;  Location: [facility];  Service: Orthopedics;  Laterality: Right;  . Foot surgery Left     Debriement  . Cyst excision Left     left hand and tendon release   . Colonoscopy  2007    Gessner normal but hx polyps    Family History  Problem Relation Age of Onset  . Coronary artery disease Maternal Grandfather   . Heart disease Maternal Grandfather   . Hypertension Father   . Stroke Father   . Colon polyps Father   . Hypertension Mother   . Diabetes Mother   . Colon polyps Mother     precancerous  . Hypertension Brother   . Colon polyps Brother   . Colon cancer Neg Hx   . Stomach cancer Neg Hx   . Esophageal cancer Neg Hx   . Rectal cancer Neg Hx   . Diabetes Brother    Social History   Social History  . Marital Status: Married    Spouse Name: N/A  . Number of Children: 1  . Years of Education: N/A   Occupational History  . cafe mgr/ referee    Social History Main Topics  . Smoking status: Former Smoker -- 1.00 packs/day    Types: Cigarettes    Quit date: 09/06/1998  . Smokeless tobacco: Never Used  . Alcohol Use: 8.4 oz/week    14 Glasses of wine per week     Comment: 0-2 per day  . Drug Use: No  . Sexual Activity: Yes    Birth Control/ Protection: Post-menopausal     Comment: Hysterectomy   Other Topics Concern  . 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 HEENT:    +headaches, difficulty swallowing, tooth/dental problems,+ sore throat,       sneezing, itching, ear ache, +nasal congestion, post nasal drip, snoring CV:    chest pain, orthopnea, PND, swelling in lower extremities, anasarca,                                                         dizziness, palpitations Resp:   shortness of breath with exertion or at rest.                productive cough,   +non-productive cough, coughing up of blood.              change in color of mucus.  wheezing.   Skin:    rash or  lesions. GI:  + heartburn, indigestion, +abdominal pain, nausea, vomiting,  diarrhea,                 change in bowel habits, loss of appetite GU: dysuria, change in color of urine, no urgency or frequency.   flank pain. MS:   +joint pain, stiffness, decreased range of motion, back pain. Neuro-     nothing unusual Psych:  change in mood or affect.  depression or +anxiety.   memory loss.  OBJ- Physical Exam General- Alert, Oriented, Affect-appropriate, Distress- none acute Skin- rash-none, lesions- none, excoriation- none Lymphadenopathy- none Head- atraumatic            Eyes- Gross vision intact, PERRLA, conjunctivae and secretions clear            Ears- Hearing, canals-normal            Nose- + Turbinate edema, no-Septal dev, mucus, polyps, erosion, perforation             Throat- Mallampati II , mucosa clear , drainage- none, tonsils- atrophic Neck- flexible , trachea midline, no stridor , thyroid nl, carotid no bruit Chest - symmetrical excursion , unlabored           Heart/CV- RRR , no murmur , no gallop  , no rub, nl s1 s2                           - JVD- none , edema- none, stasis changes- none, varices- none           Lung- clear to P&A, wheeze- none, cough- none , dullness-none, rub- none           Chest wall-  Abd-  Br/ Gen/ Rectal- Not done, not indicated Extrem- cyanosis- none, clubbing, none, atrophy- none, strength- nl Neuro- grossly intact to observation

## 2016-01-12 NOTE — Telephone Encounter (Signed)
Last OV 08/07/15 Bronchitis Alprazolam last filled 01/20/15 #60 with 0

## 2016-01-12 NOTE — Patient Instructions (Signed)
Order- lab- Allergy profile, food allergy profile, CBC w diff  Sample Dymista nasal spray     1-2 puffs each nostril once daily   Please call as needed

## 2016-01-13 LAB — RESPIRATORY ALLERGY PROFILE REGION II ~~LOC~~
ALTERNARIA ALTERNATA: 3.44 kU/L — AB
Allergen, Cedar tree, t12: <0.1 kU/L
Allergen, Cottonwood, t14: <0.1 kU/L
Allergen, Mouse Urine Protein, e78: <0.1 kU/L
Aspergillus fumigatus, m3: 0.16 kU/L — ABNORMAL HIGH
Bermuda Grass: <0.1 kU/L
CLADOSPORIUM HERBARUM: 0.15 kU/L — AB
Cat Dander: <0.1 kU/L
Cockroach: <0.1 kU/L
Common Ragweed: <0.1 kU/L
Dog Dander: <0.1 kU/L
Elm IgE: <0.1 kU/L
IGE (IMMUNOGLOBULIN E), SERUM: 58 kU/L (ref <115)
Pecan/Hickory Tree IgE: <0.1 kU/L
Penicillium Notatum: 0.51 kU/L — ABNORMAL HIGH
Timothy Grass: <0.1 kU/L

## 2016-01-13 LAB — FOOD ALLERGY PROFILE
Allergen, Salmon, f41: <0.1 kU/L
Egg White IgE: <0.1 kU/L
Hazelnut: <0.1 kU/L
Milk IgE: <0.1 kU/L
Peanut IgE: <0.1 kU/L
SHRIMP IGE: 0.1 kU/L — AB
Scallop IgE: <0.1 kU/L
Sesame Seed f10: <0.1 kU/L
Wheat IgE: <0.1 kU/L

## 2016-01-13 NOTE — Assessment & Plan Note (Signed)
Likely allergic rhinitis. Discussed environmental exposures. Plan-in vitro testing-total IgE/IgE panels

## 2016-01-26 ENCOUNTER — Encounter: Payer: Self-pay | Admitting: Internal Medicine

## 2016-01-26 MED ORDER — AZELASTINE-FLUTICASONE 137-50 MCG/ACT NA SUSP: 1.0000 | Freq: Every day | NASAL | Status: DC

## 2016-02-04 ENCOUNTER — Other Ambulatory Visit (HOSPITAL_COMMUNITY): Payer: Self-pay | Admitting: General Surgery

## 2016-03-11 ENCOUNTER — Encounter: Payer: Self-pay | Admitting: Family Medicine

## 2016-03-17 ENCOUNTER — Encounter: Payer: Self-pay | Admitting: Internal Medicine

## 2016-03-17 ENCOUNTER — Ambulatory Visit (INDEPENDENT_AMBULATORY_CARE_PROVIDER_SITE_OTHER): Payer: BC Managed Care – PPO | Admitting: Internal Medicine

## 2016-03-17 VITALS — BP 110/70 | HR 60 | Ht 66.0 in | Wt 140.1 lb

## 2016-03-17 DIAGNOSIS — J309 Allergic rhinitis, unspecified: Secondary | ICD-10-CM | POA: Diagnosis not present

## 2016-03-17 DIAGNOSIS — J3089 Other allergic rhinitis: Principal | ICD-10-CM

## 2016-03-17 DIAGNOSIS — J302 Other seasonal allergic rhinitis: Secondary | ICD-10-CM

## 2016-03-17 MED ORDER — MONTELUKAST SODIUM 10 MG PO TABS: 10.0000 mg | ORAL_TABLET | Freq: Every day | ORAL | Status: DC

## 2016-03-17 NOTE — Patient Instructions (Addendum)
Suggest you consider shrimp and "molds" as being on your watch list. Just notice if exposures seem to cause you problems and avoid if they matter.    Suggest trying otc nasal spray Nasalcrom/ cromolyn/ cromol     Script for Singulair sent to try

## 2016-03-17 NOTE — Progress Notes (Signed)
01/12/2016-55 year old female former smoker referred courtesy of Dr Beverely Lowabori; year round allergies; keeps sinus infections. No allergy lab work on file. Since childhood has had mainly allergic rhinitis less eye irritation. Asthma only as a child. Originally seasonal-spring and fall but now more perennial symptoms. [place] difference between [place] here 12 years ago. Allergy skin testing and allergy vaccine as a child remembering positives for dust and mold at that time. Family history of hayfever. ENT surgery for septoplasty and "cleaned out my sinuses", with positive history of nasal polyps in the past. She has an albuterol rescue inhaler almost never used, given when she had an acute bronchitis. 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 No significant problems with insects, foods, aspirin. She does avoid dairy products because of IBS symptoms. Uses Zyrtec when necessary, Flonase daily year round. Quit smoking 17 years ago.  03/17/2016-55 year old female former smoker followed for allergic rhinitis, recurrent sinus infections, history nasal polyps CBC with differential done 01/12/2016-eosinophils 5.3 Food Allergy Profile-borderline elevation only for shrimp IgE Environmental Allergy Profile-elevations for molds Follows For:  allergies, pt stated that she is very congested at this time with PND x 4 days.  pt stated that she broke out with red bumps on her arms and 2 on her left breast.  these did not itch and have now somewhat resolved.   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 Some seasonal spring and fall predominance. Postnasal drip bothers her more than nasal congestion. Little sneezing. [place] motel room which seemed very clean with no obvious mold. Anti-histamines don't help much. Flonase can cause  nosebleeds.  ROS-see HPI   Negative unless "+" Constitutional:    weight loss, night sweats, fevers, chills, fatigue, lassitude. HEENT:    +headaches, difficulty swallowing, tooth/dental problems,+ sore throat,       sneezing, itching, ear ache, +nasal congestion, post nasal drip, snoring CV:    chest pain, orthopnea, PND, swelling in lower extremities, anasarca,                                                         dizziness, palpitations Resp:   shortness of breath with exertion or at rest.                productive cough,   +non-productive cough, coughing up of blood.              change in color of mucus.  wheezing.   Skin:    rash or lesions. GI:  + heartburn, indigestion, +abdominal pain, nausea, vomiting, diarrhea,                 change in bowel habits, loss of appetite GU: dysuria, change in color of urine, no urgency or frequency.   flank pain. MS:   +joint pain, stiffness, decreased range of motion, back pain. Neuro-     nothing unusual Psych:  change in mood or affect.  depression or +anxiety.   memory loss.  OBJ- Physical Exam General- Alert, Oriented, Affect-appropriate, Distress- none acute Skin- rash-none, lesions- none, excoriation- none Lymphadenopathy- none Head- atraumatic            Eyes-  Gross vision intact, PERRLA, conjunctivae and secretions clear            Ears- Hearing, canals-normal            Nose- + Turbinate edema, no-Septal dev, mucus, polyps, erosion, perforation             Throat- Mallampati II , mucosa clear , drainage- none, tonsils- atrophic Neck- flexible , trachea midline, no stridor , thyroid nl, carotid no bruit Chest - symmetrical excursion , unlabored           Heart/CV- RRR , no murmur , no gallop  , no rub, nl s1 s2                           - JVD- none , edema- none, stasis changes- none, varices- none           Lung- clear to P&A, wheeze- none, cough- none , dullness-none, rub- none           Chest wall-  Abd-  Br/ Gen/ Rectal- Not  done, not indicated Extrem- cyanosis- none, clubbing, none, atrophy- none, strength- nl Neuro- grossly intact to observation

## 2016-03-17 NOTE — Assessment & Plan Note (Signed)
In vitro IgE tests do not clearly correlate with variation in her symptoms. She is going to use the test results as a "watch list". Plan-pay particular attention with exposures to shrimp and moldy environments see whether that corresponds to symptoms. Avoid any triggers. Try Singulair Try Nasalcrom

## 2016-03-26 ENCOUNTER — Other Ambulatory Visit: Payer: Self-pay | Admitting: General Practice

## 2016-03-26 MED ORDER — IBUPROFEN 800 MG PO TABS: 800.0000 mg | ORAL_TABLET | Freq: Three times a day (TID) | ORAL | Status: DC | PRN

## 2016-04-09 ENCOUNTER — Telehealth: Payer: Self-pay | Admitting: Internal Medicine

## 2016-04-09 NOTE — Telephone Encounter (Signed)
Patient advised that

## 2016-04-09 NOTE — Telephone Encounter (Signed)
Discussed with patient would be her normal office co-pay.  She is scheduled for Hem banding #1 05/03/16

## 2016-04-16 ENCOUNTER — Encounter: Payer: Self-pay | Admitting: Family Medicine

## 2016-05-03 ENCOUNTER — Encounter: Payer: Self-pay | Admitting: Internal Medicine

## 2016-05-03 ENCOUNTER — Ambulatory Visit (INDEPENDENT_AMBULATORY_CARE_PROVIDER_SITE_OTHER): Payer: BC Managed Care – PPO | Admitting: Internal Medicine

## 2016-05-03 DIAGNOSIS — K648 Other hemorrhoids: Secondary | ICD-10-CM

## 2016-05-03 HISTORY — PX: HEMORRHOID BANDING: SHX5850

## 2016-05-03 NOTE — Assessment & Plan Note (Addendum)
Banding today RTC 2 weeks Benefiber 1 tbsp daily to try to even out stool patterns

## 2016-05-03 NOTE — Patient Instructions (Addendum)
  HEMORRHOID BANDING PROCEDURE    FOLLOW-UP CARE   1. The procedure you have had should have been relatively painless since the banding of the area involved does not have nerve endings and there is no pain sensation.  The rubber band cuts off the blood supply to the hemorrhoid and the band may fall off as soon as 48 hours after the banding (the band may occasionally be seen in the toilet bowl following a bowel movement). You may notice a temporary feeling of fullness in the rectum which should respond adequately to plain Tylenol or Motrin.  2. Following the banding, avoid strenuous exercise that evening and resume full activity the next day.  A sitz bath (soaking in a warm tub) or bidet is soothing, and can be useful for cleansing the area after bowel movements.     3. To avoid constipation, take two tablespoons of natural wheat bran, natural oat bran, flax, Benefiber or any over the counter fiber supplement and increase your water intake to 7-8 glasses daily.    4. Unless you have been prescribed anorectal medication, do not put anything inside your rectum for two weeks: No suppositories, enemas, fingers, etc.  5. Occasionally, you may have more bleeding than usual after the banding procedure.  This is often from the untreated hemorrhoids rather than the treated one.  Don't be concerned if there is a tablespoon or so of blood.  If there is more blood than this, lie flat with your bottom higher than your head and apply an ice pack to the area. If the bleeding does not stop within a half an hour or if you feel faint, call our office at [phone] or go to the emergency room.  6. Problems are not common; however, if there is a substantial amount of bleeding, severe pain, chills, fever or difficulty passing urine (very rare) or other problems, you should call us at [phone] or report to the nearest emergency room.  7. Do not stay seated continuously for more than 2-3 hours for a day or  two after the procedure.  Tighten your buttock muscles 10-15 times every two hours and take 10-15 deep breaths every 1-2 hours.  Do not spend more than a few minutes on the toilet if you cannot empty your bowel; instead re-visit the toilet at a later time.    We will see you back 05/31/16 for your next banding at 3;00pm  We are giving you a handout on benefiber to read and follow.    I appreciate the opportunity to care for you. Stan Headarl Gessner, MD, [facility]

## 2016-05-03 NOTE — Progress Notes (Signed)
  The patient presents for treatment of hemorrhoids. These were diagnosed last year at an office visit in the fall of 2016. Subsequent colonoscopy was negative for screening. She's had persistent problems with bleeding, heaviness in the rectal area and pressure sensation, some anal itching and burning. Stools are generally normal and without problems others occasional straining and occasional loose stools.  Patti SwazilandJordan, CMA present.   Rectal: There is a small anterior anal tag, nontender no mass anoderm otherwise normal as well. Normal resting sphincter tone.  Anoscopy was performed with the patient in the left lateral decubitus position while a chaperone was present and revealed grade 2 internal hemorrhoids all positions right anterior was the most prominent and most inflamed.   PROCEDURE NOTE: The patient presents with symptomatic grade 2  hemorrhoids, requesting rubber band ligation of his/her hemorrhoidal disease.  All risks, benefits and alternative forms of therapy were described and informed consent was obtained.   The anorectum was pre-medicated with 5% lidocaine and 0.125% NTG The decision was made to band the RA internal hemorrhoid, and [facility] was used to perform band ligation without complication.  Digital anorectal examination was then performed to assure proper positioning of the band, and to adjust the banded tissue as required.  The patient was discharged home without pain or other issues.  Dietary and behavioral recommendations were given and along with follow-up instructions.     The following adjunctive treatments were recommended:  Benefiber 1 tbsp qd  The patient will return in about 2 weeks for  follow-up and possible additional banding as required. No complications were encountered and the patient tolerated the procedure well.  Non-latex bands used I appreciate the opportunity to care for this patient. CC: Neena RhymesKatherine Tabori, MD

## 2016-05-14 ENCOUNTER — Encounter: Payer: Self-pay | Admitting: Family Medicine

## 2016-05-14 ENCOUNTER — Ambulatory Visit (INDEPENDENT_AMBULATORY_CARE_PROVIDER_SITE_OTHER): Payer: BC Managed Care – PPO | Admitting: Family Medicine

## 2016-05-14 VITALS — BP 100/70 | HR 69 | Temp 97.9°F | Ht 66.0 in | Wt 145.6 lb

## 2016-05-14 DIAGNOSIS — J0100 Acute maxillary sinusitis, unspecified: Secondary | ICD-10-CM

## 2016-05-14 MED ORDER — DOXYCYCLINE HYCLATE 100 MG PO CAPS: 100.0000 mg | ORAL_CAPSULE | Freq: Two times a day (BID) | ORAL | 0 refills | Status: DC

## 2016-05-14 NOTE — Progress Notes (Signed)
Pre visit review using our clinic review tool, if applicable. No additional management support is needed unless otherwise documented below in the visit note. 

## 2016-05-14 NOTE — Progress Notes (Signed)
HPI:  Sinus congestion: -started: 2- 3 weeks ago -symptoms:nasal congestion, sore throat, cough - now worsening the last few days with thick green nsal congestion and maxillary sinus pain -denies:fever, SOB, NVD, tooth pain -has tried: her allergy medication zyrtec and flonase -sick contacts/travel/risks: no reported flu, strep or tick exposure -Hx of: allergies, sinus infection  ROS: See pertinent positives and negatives per HPI.  Past Medical History:  Diagnosis Date  . Allergic rhinitis   . Allergy   . Anxiety   . Arthritis    hands, feet,back - otc med prn  . Colon polyp   . Depression   . Dysrhythmia    hx pvc,pac  . GERD (gastroesophageal reflux disease)   . IBS (irritable bowel syndrome)    diet controlled  . PONV (postoperative nausea and vomiting)   . Rosacea     Past Surgical History:  Procedure Laterality Date  . APPENDECTOMY    . CARPAL TUNNEL RELEASE Bilateral    bilateral  . CHOLECYSTECTOMY    . COLONOSCOPY  2007   Gessner normal but hx polyps   . CYST EXCISION Left    left hand and tendon release  . FOOT NEUROMA SURGERY Right 2010  . FOOT SURGERY Left    Debriement  . MASS EXCISION Right 12/05/2014   Procedure: EXCISION MASS, index finger;  Surgeon: Betha LoaKevin Kuzma, MD;  Location: [facility];  Service: Orthopedics;  Laterality: Right;  . NASAL SEPTUM SURGERY    . right calcaneous surgery    . TUMOR EXCISION Left    left lower leg skin  . ulna nerve release Right 2007  . ulna nerve release Left 2007  . ULNAR NERVE TRANSPOSITION Right 2009  . VAGINAL HYSTERECTOMY  2000    Family History  Problem Relation Age of Onset  . Coronary artery disease Maternal Grandfather   . Heart disease Maternal Grandfather   . Hypertension Father   . Stroke Father   . Colon polyps Father   . Hypertension Mother   . Diabetes Mother   . Colon polyps Mother     precancerous  . Hypertension Brother   . Colon polyps Brother   . Diabetes Brother    . Colon cancer Neg Hx   . Stomach cancer Neg Hx   . Esophageal cancer Neg Hx   . Rectal cancer Neg Hx     Social History   Social History  . Marital status: Married    Spouse name: N/A  . Number of children: 1  . Years of education: N/A   Occupational History  . cafe mgr/ referee    Social History Main Topics  . Smoking status: Former Smoker    Packs/day: 1.00    Types: Cigarettes    Quit date: 09/06/1998  . Smokeless tobacco: Never Used  . Alcohol use 8.4 oz/week    14 Glasses of wine per week     Comment: 0-2 per day  . Drug use: No  . Sexual activity: Yes    Birth control/ protection: Post-menopausal     Comment: Hysterectomy   Other Topics Concern  . 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  albuterol (PROVENTIL HFA;VENTOLIN HFA) 108 (90 BASE) MCG/ACT inhaler, Inhale 2 puffs into the lungs every 6 (  six) hours as needed for wheezing or shortness of breath., Disp: 1 Inhaler, Rfl: 2 .  ALPRAZolam (XANAX) 0.25 MG tablet, Take 1 tablet (0.25 mg total) by mouth as directed. 1-2 tabs po q6 hours prn anxiety, Disp: 60 tablet, Rfl: 0 .  Ascorbic Acid (VITAMIN C) 100 MG CHEW, Chew by mouth daily.  , Disp: , Rfl:  .  Azelastine-Fluticasone (DYMISTA) 137-50 MCG/ACT SUSP, Place 1-2 puffs into the nose daily., Disp: 1 Bottle, Rfl: 5 .  b complex vitamins tablet, Take 1 tablet by mouth daily., Disp: , Rfl:  .  BIOTIN PO, Take by mouth daily., Disp: , Rfl:  .  Calcium 600-200 MG-UNIT per tablet, Take 1 tablet by mouth daily.  , Disp: , Rfl:  .  cetirizine (ZYRTEC) 10 MG tablet, Take 10 mg by mouth daily.  , Disp: , Rfl:  .  Dexlansoprazole (DEXILANT) 30 MG capsule, TAKE 1 CAPSULE (30 MG TOTAL) BY MOUTH DAILY., Disp: 90 capsule, Rfl: 3 .  Fexofenadine HCl (MUCINEX ALLERGY PO), Take by mouth as needed., Disp: , Rfl:  .   fish oil-omega-3 fatty acids 1000 MG capsule, Take 1 g by mouth daily.  , Disp: , Rfl:  .  Flaxseed, Linseed, (FLAX SEED OIL) 1000 MG CAPS, Take by mouth daily.  , Disp: , Rfl:  .  fluticasone (FLONASE) 50 MCG/ACT nasal spray, PLACE 2 SPRAYS INTO BOTH NOSTRILS DAILY., Disp: 16 g, Rfl: 1 .  hydroquinone 4 % cream, Apply topically 2 (two) times daily., Disp: , Rfl: 2 .  ibuprofen (ADVIL,MOTRIN) 800 MG tablet, Take 1 tablet (800 mg total) by mouth every 8 (eight) hours as needed., Disp: 60 tablet, Rfl: 2 .  TURMERIC PO, Take by mouth., Disp: , Rfl:  .  doxycycline (VIBRAMYCIN) 100 MG capsule, Take 1 capsule (100 mg total) by mouth 2 (two) times daily., Disp: 20 capsule, Rfl: 0  EXAM:  Vitals:   05/14/16 1435  BP: 100/70  Pulse: 69  Temp: 97.9 F (36.6 C)    Body mass index is 23.5 kg/m.  GENERAL: vitals reviewed and listed above, alert, oriented, appears well hydrated and in no acute distress  HEENT: atraumatic, conjunttiva clear, no obvious abnormalities on inspection of external nose and ears, normal appearance of ear canals and TMs, thick nasal congestion, mild post oropharyngeal erythema with PND, no tonsillar edema or exudate, no sinus TTP  NECK: no obvious masses on inspection  LUNGS: clear to auscultation bilaterally, no wheezes, rales or rhonchi, good air movement  CV: HRRR, no peripheral edema  MS: moves all extremities without noticeable abnormality  PSYCH: pleasant and cooperative, no obvious depression or anxiety  ASSESSMENT AND PLAN:  Discussed the following assessment and plan:  Acute maxillary sinusitis, recurrence not specified  -We discussed potential etiologies, with sinusitis being most likely. Opted to treat with doxy. We discussed treatment side effects, likely course, and signs of developing a serious illness. -of course, we advised to return or notify a doctor immediately if symptoms worsen or persist or new concerns arise.  She declined avs.  There  are no Patient Instructions on file for this visit.  Kriste Basque R., DO

## 2016-05-26 ENCOUNTER — Other Ambulatory Visit: Payer: Self-pay | Admitting: Family Medicine

## 2016-05-31 ENCOUNTER — Encounter: Payer: Self-pay | Admitting: Internal Medicine

## 2016-05-31 ENCOUNTER — Ambulatory Visit (INDEPENDENT_AMBULATORY_CARE_PROVIDER_SITE_OTHER): Payer: BC Managed Care – PPO | Admitting: Internal Medicine

## 2016-05-31 DIAGNOSIS — K648 Other hemorrhoids: Secondary | ICD-10-CM

## 2016-05-31 DIAGNOSIS — Z91040 Latex allergy status: Secondary | ICD-10-CM

## 2016-05-31 NOTE — Assessment & Plan Note (Signed)
RP and LL banded non-latex RTC prn benefiber

## 2016-05-31 NOTE — Progress Notes (Signed)
  She did not have any problems after the first banding, she is here for follow-up banding. Rectal bleeding and anal itching and burning and pressure are improved but not gone.  PROCEDURE NOTE: The patient presents with symptomatic grade 2  hemorrhoids, requesting rubber band ligation of his/her hemorrhoidal disease.  All risks, benefits and alternative forms of therapy were described and informed consent was obtained.   The anorectum was pre-medicated with 0.125% NTG and 5% lidocaine The decision was made to band the RP and LL internal hemorrhoid, and the [facility] System was used to perform band ligation without complication.  Digital anorectal examination was then performed to assure proper positioning of the band, and to adjust the banded tissue as required.  The patient was discharged home without pain or other issues.  Dietary and behavioral recommendations were given and along with follow-up instructions.     The following adjunctive treatments were recommended:  Continue Benefiber  The patient will return as needed for follow-up and possible additional banding as required. No complications were encountered and the patient tolerated the procedure well.  I appreciate the opportunity to care for this patient. CC: Neena RhymesKatherine Tabori, MD

## 2016-05-31 NOTE — Patient Instructions (Signed)
Continue Benefiber.  Follow up as needed.     HEMORRHOID BANDING PROCEDURE    FOLLOW-UP CARE   1. The procedure you have had should have been relatively painless since the banding of the area involved does not have nerve endings and there is no pain sensation.  The rubber band cuts off the blood supply to the hemorrhoid and the band may fall off as soon as 48 hours after the banding (the band may occasionally be seen in the toilet bowl following a bowel movement). You may notice a temporary feeling of fullness in the rectum which should respond adequately to plain Tylenol or Motrin.  2. Following the banding, avoid strenuous exercise that evening and resume full activity the next day.  A sitz bath (soaking in a warm tub) or bidet is soothing, and can be useful for cleansing the area after bowel movements.     3. To avoid constipation, take two tablespoons of natural wheat bran, natural oat bran, flax, Benefiber or any over the counter fiber supplement and increase your water intake to 7-8 glasses daily.    4. Unless you have been prescribed anorectal medication, do not put anything inside your rectum for two weeks: No suppositories, enemas, fingers, etc.  5. Occasionally, you may have more bleeding than usual after the banding procedure.  This is often from the untreated hemorrhoids rather than the treated one.  Don't be concerned if there is a tablespoon or so of blood.  If there is more blood than this, lie flat with your bottom higher than your head and apply an ice pack to the area. If the bleeding does not stop within a half an hour or if you feel faint, call our office at [phone] or go to the emergency room.  6. Problems are not common; however, if there is a substantial amount of bleeding, severe pain, chills, fever or difficulty passing urine (very rare) or other problems, you should call us at [phone] or report to the nearest emergency room.  7. Do not stay seated  continuously for more than 2-3 hours for a day or two after the procedure.  Tighten your buttock muscles 10-15 times every two hours and take 10-15 deep breaths every 1-2 hours.  Do not spend more than a few minutes on the toilet if you cannot empty your bowel; instead re-visit the toilet at a later time.

## 2016-06-01 ENCOUNTER — Encounter: Payer: Self-pay | Admitting: Internal Medicine

## 2016-06-09 ENCOUNTER — Encounter: Payer: Self-pay | Admitting: Family Medicine

## 2016-06-09 ENCOUNTER — Ambulatory Visit (INDEPENDENT_AMBULATORY_CARE_PROVIDER_SITE_OTHER): Payer: BC Managed Care – PPO | Admitting: Family Medicine

## 2016-06-09 VITALS — BP 118/78 | HR 83 | Temp 98.0°F | Resp 17 | Ht 66.0 in | Wt 143.0 lb

## 2016-06-09 DIAGNOSIS — J0101 Acute recurrent maxillary sinusitis: Secondary | ICD-10-CM

## 2016-06-09 MED ORDER — SULFAMETHOXAZOLE-TRIMETHOPRIM 800-160 MG PO TABS: 1.0000 | ORAL_TABLET | Freq: Two times a day (BID) | ORAL | 0 refills | Status: DC

## 2016-06-09 NOTE — Progress Notes (Signed)
   Subjective:    Patient ID: Robin Mueller, female    DOB: 12-10-1960, 55 y.o.   MRN: [id number]  HPI URI- saw Dr Selena BattenKim 9/8 and sxs returned day after finishing abx (doxy).  + facial pain/pressure, HA.  Visible swelling of L maxillary sinus.  No fevers.  No tooth pain.  + nausea, no vomiting.  + foul taste in mouth.  Bilateral ear pain.   Review of Systems For ROS see HPI     Objective:   Physical Exam  Constitutional: She appears well-developed and well-nourished. No distress.  HENT:  Head: Normocephalic and atraumatic.  Right Ear: Tympanic membrane normal.  Left Ear: Tympanic membrane normal.  Nose: Mucosal edema and rhinorrhea present. Right sinus exhibits no maxillary sinus tenderness and no frontal sinus tenderness. Left sinus exhibits maxillary sinus tenderness and frontal sinus tenderness.  Mouth/Throat: Uvula is midline and mucous membranes are normal. Posterior oropharyngeal erythema present. No oropharyngeal exudate.  Eyes: Conjunctivae and EOM are normal. Pupils are equal, round, and reactive to light.  Neck: Normal range of motion. Neck supple.  Cardiovascular: Normal rate, regular rhythm and normal heart sounds.   Pulmonary/Chest: Effort normal and breath sounds normal. No respiratory distress. She has no wheezes.  Lymphadenopathy:    She has no cervical adenopathy.  Vitals reviewed.         Assessment & Plan:

## 2016-06-09 NOTE — Assessment & Plan Note (Signed)
Recurring problem for pt.  sxs and PE consistent w/ infxn.  Start abx.  Reviewed supportive care and red flags that should prompt return.  Pt expressed understanding and is in agreement w/ plan.  

## 2016-06-09 NOTE — Patient Instructions (Signed)
Follow up as needed Start the Bactrim as directed- take w/ food Drink plenty of fluids REST! Call with any questions or concerns Hang in there!!!

## 2016-06-09 NOTE — Progress Notes (Signed)
Pre visit review using our clinic review tool, if applicable. No additional management support is needed unless otherwise documented below in the visit note. 

## 2016-07-15 ENCOUNTER — Ambulatory Visit (INDEPENDENT_AMBULATORY_CARE_PROVIDER_SITE_OTHER): Payer: BC Managed Care – PPO | Admitting: Family Medicine

## 2016-07-15 ENCOUNTER — Encounter: Payer: Self-pay | Admitting: Family Medicine

## 2016-07-15 VITALS — BP 118/80 | HR 51 | Temp 98.0°F | Resp 16 | Ht 66.0 in | Wt 144.2 lb

## 2016-07-15 DIAGNOSIS — Z0000 Encounter for general adult medical examination without abnormal findings: Secondary | ICD-10-CM | POA: Diagnosis not present

## 2016-07-15 DIAGNOSIS — M79671 Pain in right foot: Secondary | ICD-10-CM

## 2016-07-15 LAB — CBC WITH DIFFERENTIAL/PLATELET
BASOS ABS: 65 {cells}/uL (ref 0–200)
BASOS PCT: 1 %
EOS ABS: 390 {cells}/uL (ref 15–500)
Eosinophils Relative: 6 %
HEMATOCRIT: 39.2 % (ref 35.0–45.0)
Hemoglobin: 12.9 g/dL (ref 11.7–15.5)
LYMPHS PCT: 47 %
Lymphs Abs: 3055 cells/uL (ref 850–3900)
MCH: 32.3 pg (ref 27.0–33.0)
MCHC: 32.9 g/dL (ref 32.0–36.0)
MCV: 98 fL (ref 80.0–100.0)
MONO ABS: 585 {cells}/uL (ref 200–950)
MONOS PCT: 9 %
MPV: 9.9 fL (ref 7.5–12.5)
NEUTROS ABS: 2405 {cells}/uL (ref 1500–7800)
Neutrophils Relative %: 37 %
PLATELETS: 282 10*3/uL (ref 140–400)
RBC: 4 MIL/uL (ref 3.80–5.10)
RDW: 13 % (ref 11.0–15.0)
WBC: 6.5 10*3/uL (ref 3.8–10.8)

## 2016-07-15 LAB — HEPATIC FUNCTION PANEL
ALK PHOS: 41 U/L (ref 33–130)
ALT: 19 U/L (ref 6–29)
AST: 29 U/L (ref 10–35)
Albumin: 4.5 g/dL (ref 3.6–5.1)
BILIRUBIN DIRECT: 0.1 mg/dL (ref <=0.2)
BILIRUBIN INDIRECT: 0.5 mg/dL (ref 0.2–1.2)
TOTAL PROTEIN: 7 g/dL (ref 6.1–8.1)
Total Bilirubin: 0.6 mg/dL (ref 0.2–1.2)

## 2016-07-15 LAB — BASIC METABOLIC PANEL WITH GFR
BUN: 19 mg/dL (ref 7–25)
CO2: 23 mmol/L (ref 20–31)
Calcium: 9.4 mg/dL (ref 8.6–10.4)
Chloride: 102 mmol/L (ref 98–110)
Creat: 0.83 mg/dL (ref 0.50–1.05)
Glucose, Bld: 86 mg/dL (ref 65–99)
Potassium: 3.8 mmol/L (ref 3.5–5.3)
Sodium: 137 mmol/L (ref 135–146)

## 2016-07-15 LAB — LIPID PANEL
Cholesterol: 245 mg/dL — ABNORMAL HIGH
HDL: 68 mg/dL
LDL Cholesterol: 161 mg/dL — ABNORMAL HIGH
Total CHOL/HDL Ratio: 3.6 ratio
Triglycerides: 82 mg/dL
VLDL: 16 mg/dL

## 2016-07-15 LAB — TSH: TSH: 2.63 m[IU]/L

## 2016-07-15 NOTE — Progress Notes (Signed)
Pre visit review using our clinic review tool, if applicable. No additional management support is needed unless otherwise documented below in the visit note. 

## 2016-07-15 NOTE — Patient Instructions (Signed)
Follow up in 1 year or as needed We'll notify you of your lab results and make any changes if needed Continue to work on healthy diet and regular exercise- you look great! Start the OTC FungiNail for the nail fungus We'll call you with your Sports Med appt for the foot pain Call with any questions or concerns Happy Holidays!!!

## 2016-07-15 NOTE — Progress Notes (Signed)
   Subjective:    Patient ID: Robin Mueller, female    DOB: Dec 14, 1960, 55 y.o.   MRN: [id number]  HPI CPE- UTD on colonoscopy, mammo.  No need for pap due to hysterectomy.   Review of Systems Patient reports no vision/ hearing changes, adenopathy,fever, weight change,  persistant/recurrent hoarseness , swallowing issues, chest pain, palpitations, edema, persistant/recurrent cough, hemoptysis, dyspnea (rest/exertional/paroxysmal nocturnal), gastrointestinal bleeding (melena, rectal bleeding), abdominal pain, significant heartburn, bowel changes, GU symptoms (dysuria, hematuria, incontinence), Gyn symptoms (abnormal  bleeding, pain),  syncope, focal weakness, memory loss, numbness & tingling, skin/hair/nail changes, abnormal bruising or bleeding, anxiety, or depression.     Objective:   Physical Exam General Appearance:    Alert, cooperative, no distress, appears stated age  Head:    Normocephalic, without obvious abnormality, atraumatic  Eyes:    PERRL, conjunctiva/corneas clear, EOM's intact, fundi    benign, both eyes  Ears:    Normal TM's and external ear canals, both ears  Nose:   Nares normal, septum midline, mucosa normal, no drainage    or sinus tenderness  Throat:   Lips, mucosa, and tongue normal; teeth and gums normal  Neck:   Supple, symmetrical, trachea midline, no adenopathy;    Thyroid: no enlargement/tenderness/nodules  Back:     Symmetric, no curvature, ROM normal, no CVA tenderness  Lungs:     Clear to auscultation bilaterally, respirations unlabored  Chest Wall:    No tenderness or deformity   Heart:    Regular rate and rhythm, S1 and S2 normal, no murmur, rub   or gallop  Breast Exam:    Deferred to mammo  Abdomen:     Soft, non-tender, bowel sounds active all four quadrants,    no masses, no organomegaly  Genitalia:    Deferred to GYN  Rectal:    Extremities:   Extremities normal, atraumatic, no cyanosis or edema  Pulses:   2+ and symmetric all extremities    Skin:   Skin color, texture, turgor normal, no rashes or lesions.  Nail fungus bilaterally  Lymph nodes:   Cervical, supraclavicular, and axillary nodes normal  Neurologic:   CNII-XII intact, normal strength, sensation and reflexes    throughout          Assessment & Plan:

## 2016-07-16 LAB — VITAMIN D 25 HYDROXY (VIT D DEFICIENCY, FRACTURES): Vit D, 25-Hydroxy: 48 ng/mL (ref 30–100)

## 2016-07-16 NOTE — Assessment & Plan Note (Signed)
Pt's PE WNL w/ exception of being mildly overweight.  UTD on colonoscopy, mammo.  Has already had flu shot.  Check labs.  Anticipatory guidance provided.

## 2016-07-19 ENCOUNTER — Ambulatory Visit (INDEPENDENT_AMBULATORY_CARE_PROVIDER_SITE_OTHER): Payer: BC Managed Care – PPO | Admitting: Family Medicine

## 2016-07-19 ENCOUNTER — Ambulatory Visit (HOSPITAL_BASED_OUTPATIENT_CLINIC_OR_DEPARTMENT_OTHER)
Admission: RE | Admit: 2016-07-19 | Discharge: 2016-07-19 | Disposition: A | Discharge status: Home / Self Care | Payer: BC Managed Care – PPO | Source: Ambulatory Visit | Attending: Family Medicine | Admitting: Family Medicine

## 2016-07-19 ENCOUNTER — Encounter: Payer: Self-pay | Admitting: Family Medicine

## 2016-07-19 VITALS — BP 123/80 | HR 59 | Ht 66.0 in | Wt 139.0 lb

## 2016-07-19 DIAGNOSIS — M79671 Pain in right foot: Secondary | ICD-10-CM

## 2016-07-19 DIAGNOSIS — S99921A Unspecified injury of right foot, initial encounter: Secondary | ICD-10-CM

## 2016-07-19 DIAGNOSIS — S6991XA Unspecified injury of right wrist, hand and finger(s), initial encounter: Secondary | ICD-10-CM | POA: Diagnosis not present

## 2016-07-19 IMAGING — DX DG FOOT COMPLETE 3+V*R*
3 series · 3 of 3 positions shown · non-contrast
Comparison: None.

CLINICAL DATA: Right foot pain around first metatarsal. Injury 4
months ago.

EXAM:
RIGHT FOOT COMPLETE - 3+ VIEW

[foot ap]
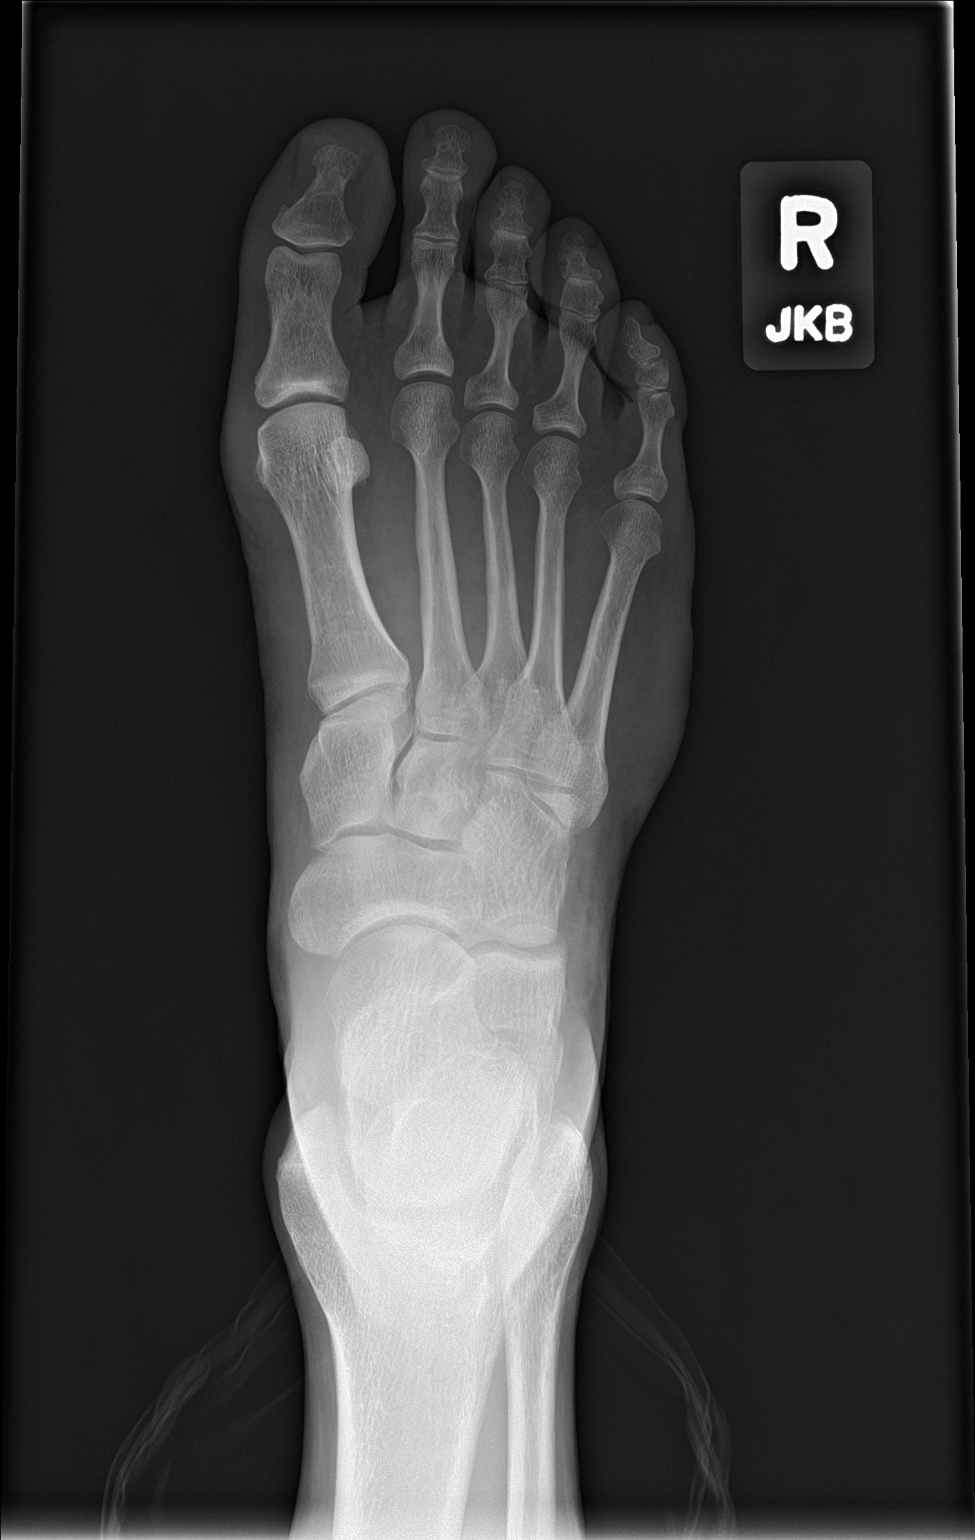

[foot obl]
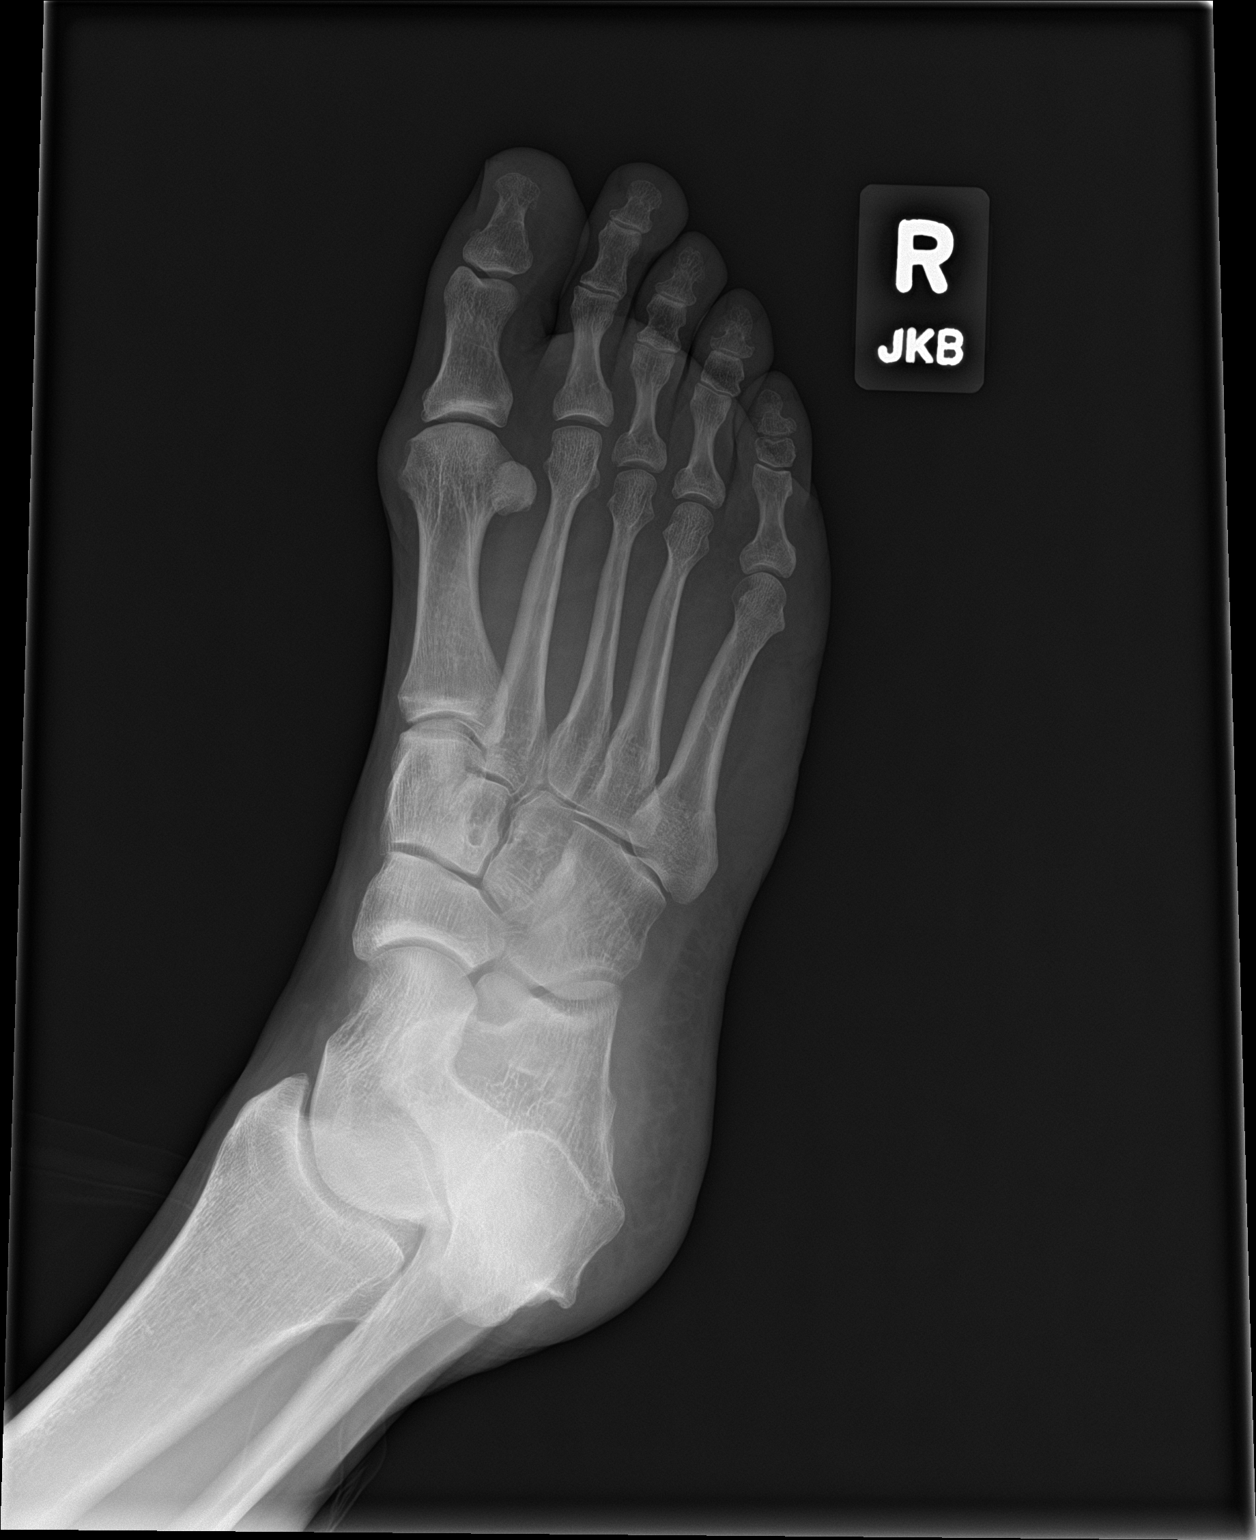

[foot lat]
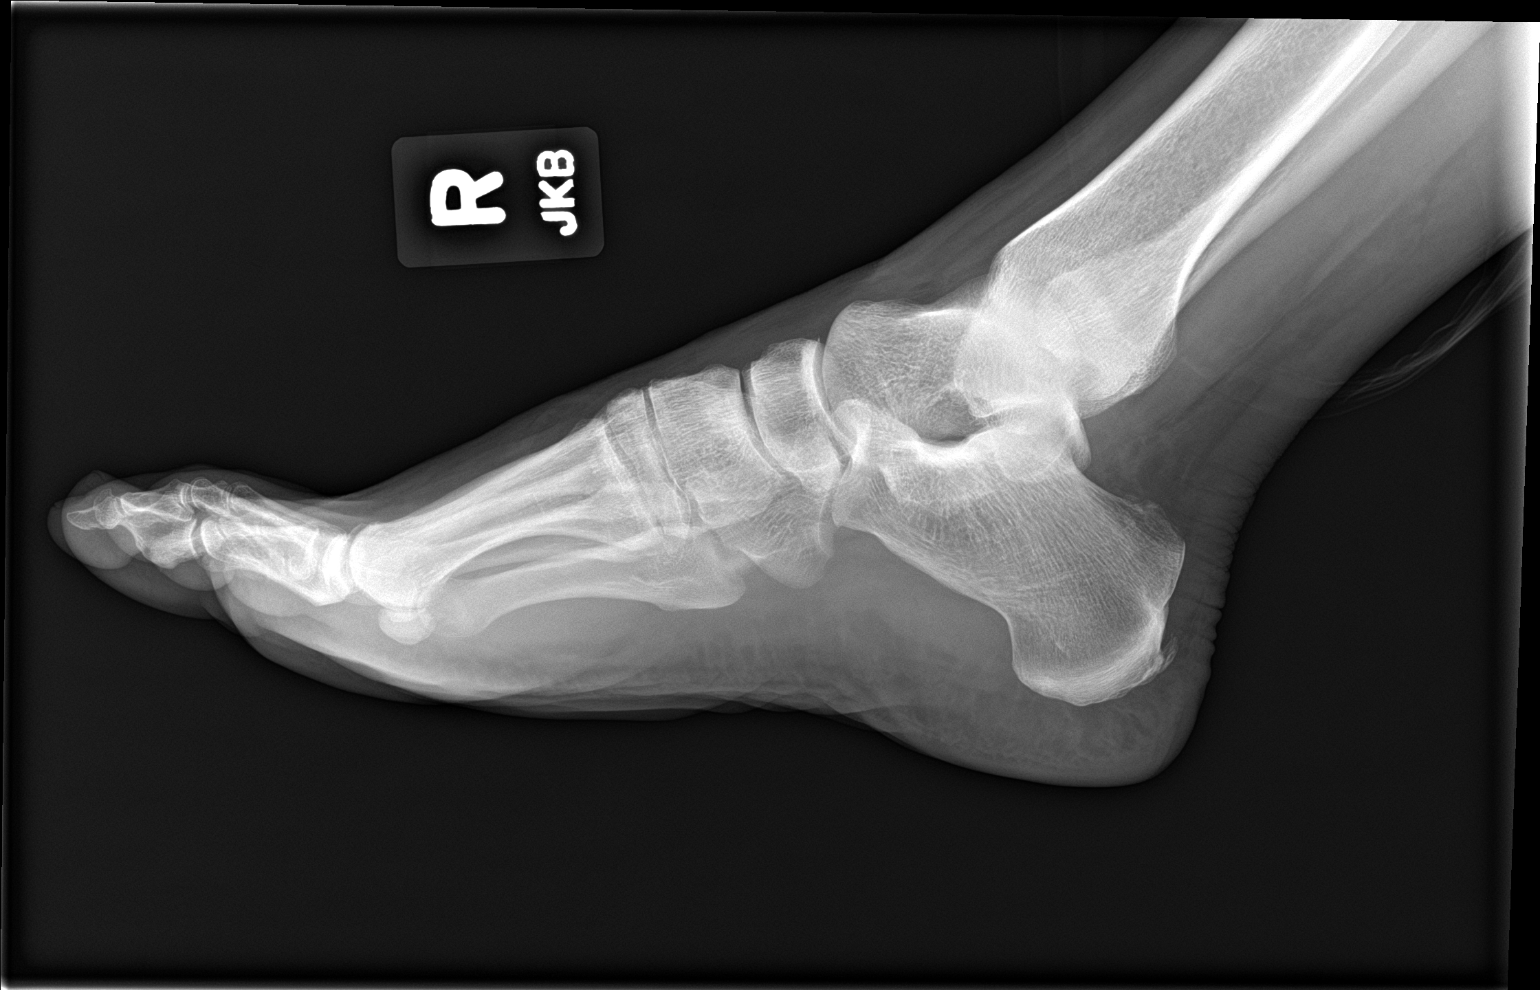

[3 of 3 positions shown; findings below may reference images not displayed]

FINDINGS: There is no evidence of fracture or dislocation. There is no
evidence of arthropathy or other focal bone abnormality. Soft
tissues are unremarkable.
IMPRESSION: Negative.

## 2016-07-22 DIAGNOSIS — S99921A Unspecified injury of right foot, initial encounter: Secondary | ICD-10-CM | POA: Insufficient documentation

## 2016-07-22 DIAGNOSIS — S6991XA Unspecified injury of right wrist, hand and finger(s), initial encounter: Secondary | ICD-10-CM | POA: Insufficient documentation

## 2016-07-22 NOTE — Assessment & Plan Note (Signed)
Right 3rd digit injury - exam reassuring along with ultrasound.  No evidence fracture, tendon or collateral ligament injury.  Reassured patient.  Consistent with mild sprain.  Consider buddy taping as needed for a couple weeks.  F/u prn.

## 2016-07-22 NOTE — Progress Notes (Signed)
PCP and consultation requested by: Neena RhymesKatherine Tabori, MD  Subjective:   HPI: Patient is a 55 y.o. female here for right foot, middle finger pain.  Patient reports for about 4 months she's had right foot pain. 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 Some swelling then but none now. Still getting off and on pain in medial aspect of this foot. Worse with driving. No skin changes, numbness. Also reports on 11/11 she was hit by a lacrosse ball on tip of right middle finger.   Some soreness to 3/10 level about PIP joint. No bruising, skin changes.  Some mild swelling.  Past Medical History:  Diagnosis Date  . Allergic rhinitis   . Allergy   . Anxiety   . Arthritis    hands, feet,back - otc med prn  . Colon polyp   . Depression   . Dysrhythmia    hx pvc,pac  . GERD (gastroesophageal reflux disease)   . IBS (irritable bowel syndrome)    diet controlled  . Internal hemorrhoid   . PONV (postoperative nausea and vomiting)   . Rosacea     Current Outpatient Prescriptions on File Prior to Visit  Medication Sig Dispense Refill  . ALPRAZolam (XANAX) 0.25 MG tablet Take 1 tablet (0.25 mg total) by mouth as directed. 1-2 tabs po q6 hours prn anxiety 60 tablet 0  . Ascorbic Acid (VITAMIN C) 100 MG CHEW Chew by mouth daily.      . Azelastine-Fluticasone (DYMISTA) 137-50 MCG/ACT SUSP Place 1-2 puffs into the nose daily. 1 Bottle 5  . b complex vitamins tablet Take 1 tablet by mouth daily.    Marland Kitchen. BIOTIN PO Take by mouth daily.    . Calcium 600-200 MG-UNIT per tablet Take 1 tablet by mouth daily.      Marland Kitchen. Fexofenadine HCl (MUCINEX ALLERGY PO) Take by mouth as needed.    . fish oil-omega-3 fatty acids 1000 MG capsule Take 1 g by mouth daily.      . Flaxseed, Linseed, (FLAX SEED OIL) 1000 MG CAPS Take by mouth daily.      . hydroquinone 4 % cream Apply topically 2 (two) times daily.  2  . ibuprofen (ADVIL,MOTRIN) 800 MG tablet TAKE 1 TABLET BY MOUTH EVERY  8 HOURS AS NEEDED 60 tablet 1  . TURMERIC PO Take by mouth.     No current facility-administered medications on file prior to visit.     Past Surgical History:  Procedure Laterality Date  . APPENDECTOMY    . CARPAL TUNNEL RELEASE Bilateral    bilateral  . CHOLECYSTECTOMY    . COLONOSCOPY  2007   Gessner normal but hx polyps   . CYST EXCISION Left    left hand and tendon release  . FOOT NEUROMA SURGERY Right 2010  . FOOT SURGERY Left    Debriement  . HEMORRHOID BANDING  05/03/2016  . MASS EXCISION Right 12/05/2014   Procedure: EXCISION MASS, index finger;  Surgeon: Betha LoaKevin Kuzma, MD;  Location: [facility];  Service: Orthopedics;  Laterality: Right;  . NASAL SEPTUM SURGERY    . right calcaneous surgery    . TUMOR EXCISION Left    left lower leg skin  . ulna nerve release Right 2007  . ulna nerve release Left 2007  . ULNAR NERVE TRANSPOSITION Right 2009  . VAGINAL HYSTERECTOMY  2000    Allergies  Allergen Reactions  . Amoxicillin     Diarrhea   .  Adhesive [Tape] Rash  . Latex Rash    She says she has a mild reaction to this    Social History   Social History  . Marital status: Married    Spouse name: N/A  . Number of children: 1  . Years of education: N/A   Occupational History  . cafe mgr/ referee    Social History Main Topics  . Smoking status: Former Smoker    Packs/day: 1.00    Types: Cigarettes    Quit date: 09/06/1998  . Smokeless tobacco: Never Used  . Alcohol use 8.4 oz/week    14 Glasses of wine per week     Comment: 0-2 per day  . Drug use: No  . Sexual activity: Yes    Birth control/ protection: Post-menopausal     Comment: Hysterectomy   Other Topics Concern  . 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 Coronary artery disease Maternal  Grandfather   . Heart disease Maternal Grandfather   . Hypertension Father   . Stroke Father   . Colon polyps Father   . Hypertension Mother   . Diabetes Mother   . Colon polyps Mother     precancerous  . Heart attack Mother   . Hypertension Brother   . Colon polyps Brother   . Diabetes Brother   . Colon cancer Neg Hx   . Stomach cancer Neg Hx   . Esophageal cancer Neg Hx   . Rectal cancer Neg Hx     BP 123/80   Pulse (!) 59   Ht 5\' 6"  (1.676 m)   Wt 139 lb (63 kg)   BMI 22.44 kg/m   Review of Systems: See HPI above.     Objective:  Physical Exam:  Gen: NAD, comfortable in exam room  Right foot/ankle: No gross deformity, swelling, ecchymoses FROM with 5/5 strength. TTP 1st metatarsal. Negative ant drawer and talar tilt.   Negative syndesmotic compression. Thompsons test negative. NV intact distally.  Right 3rd digit: Mild swelling throughout digit.  No malrotation, angulation. No TTP. FROM with 5/5 strength MCP, PIP, DIP joints. Collateral ligaments intact. NVI distally.  MSK u/s:  No abnormalities of 3rd digit.  1st metatarsal of foot also appears normal.  Assessment & Plan:  1. Right foot injury - independently reviewed radiographs and ultrasound - no evidence fracture or other abnormalities.  Consistent with 1st metatarsal contusion.  Reassured.  Icing, tylenol or motrin if needed.  2. Right 3rd digit injury - exam reassuring along with ultrasound.  No evidence fracture, tendon or collateral ligament injury.  Reassured patient.  Consistent with mild sprain.  Consider buddy taping as needed for a couple weeks.  F/u prn.

## 2016-07-22 NOTE — Assessment & Plan Note (Signed)
independently reviewed radiographs and ultrasound - no evidence fracture or other abnormalities.  Consistent with 1st metatarsal contusion.  Reassured.  Icing, tylenol or motrin if needed.

## 2016-08-04 ENCOUNTER — Other Ambulatory Visit: Payer: Self-pay | Admitting: Family Medicine

## 2016-08-24 ENCOUNTER — Encounter: Payer: Self-pay | Admitting: Family Medicine

## 2016-08-24 ENCOUNTER — Ambulatory Visit (INDEPENDENT_AMBULATORY_CARE_PROVIDER_SITE_OTHER): Payer: BC Managed Care – PPO | Admitting: Family Medicine

## 2016-08-24 DIAGNOSIS — M25552 Pain in left hip: Secondary | ICD-10-CM | POA: Diagnosis not present

## 2016-08-24 NOTE — Patient Instructions (Signed)
You have IT band syndrome Ice over area of pain 3-4 times a day for 15 minutes at a time Standing hip rotations and Hip side raise exercise 3 sets of 10-15 once a day - add weights if this becomes too easy. Stretches - pick 2 and hold for 20-30 seconds x 3 - do once or twice a day. Tylenol and/or aleve as needed for pain. If not improving, can consider physical therapy and/or steroid injections. Follow up with me in 6 weeks.

## 2016-09-05 DIAGNOSIS — M25552 Pain in left hip: Secondary | ICD-10-CM | POA: Insufficient documentation

## 2016-09-05 NOTE — Progress Notes (Signed)
PCP: Neena RhymesKatherine Tabori, MD  Subjective:   HPI: Patient is a 55 y.o. female here for left leg pain.  Patient reports she's had about 5 days of lateral left hip pain. Pain radiates down to mid-thigh. Maybe some mild swelling. Has been seeing massage therapist with mild benefit. Pain level 4/10, sharp. Got a sharp pain when walking in [employer]. Could barely ambulate on Friday. Was able to do a 3 mile run yesterday with minimal issues. No skin changes, numbness. No back pain.  Past Medical History:  Diagnosis Date  . Allergic rhinitis   . Allergy   . Anxiety   . Arthritis    hands, feet,back - otc med prn  . Colon polyp   . Depression   . Dysrhythmia    hx pvc,pac  . GERD (gastroesophageal reflux disease)   . IBS (irritable bowel syndrome)    diet controlled  . Internal hemorrhoid   . PONV (postoperative nausea and vomiting)   . Rosacea     Current Outpatient Prescriptions on File Prior to Visit  Medication Sig Dispense Refill  . ALPRAZolam (XANAX) 0.25 MG tablet Take 1 tablet (0.25 mg total) by mouth as directed. 1-2 tabs po q6 hours prn anxiety 60 tablet 0  . Ascorbic Acid (VITAMIN C) 100 MG CHEW Chew by mouth daily.      . Azelastine-Fluticasone (DYMISTA) 137-50 MCG/ACT SUSP Place 1-2 puffs into the nose daily. 1 Bottle 5  . b complex vitamins tablet Take 1 tablet by mouth daily.    Marland Kitchen. BIOTIN PO Take by mouth daily.    . Calcium 600-200 MG-UNIT per tablet Take 1 tablet by mouth daily.      Marland Kitchen. DEXILANT 30 MG capsule TAKE 1 CAPSULE (30 MG TOTAL) BY MOUTH DAILY. 90 capsule 3  . Fexofenadine HCl (MUCINEX ALLERGY PO) Take by mouth as needed.    . fish oil-omega-3 fatty acids 1000 MG capsule Take 1 g by mouth daily.      . Flaxseed, Linseed, (FLAX SEED OIL) 1000 MG CAPS Take by mouth daily.      . hydroquinone 4 % cream Apply topically 2 (two) times daily.  2  . ibuprofen (ADVIL,MOTRIN) 800 MG tablet TAKE 1 TABLET BY MOUTH EVERY 8 HOURS AS NEEDED 60 tablet 1  . TURMERIC  PO Take by mouth.     No current facility-administered medications on file prior to visit.     Past Surgical History:  Procedure Laterality Date  . APPENDECTOMY    . CARPAL TUNNEL RELEASE Bilateral    bilateral  . CHOLECYSTECTOMY    . COLONOSCOPY  2007   Gessner normal but hx polyps   . CYST EXCISION Left    left hand and tendon release  . FOOT NEUROMA SURGERY Right 2010  . FOOT SURGERY Left    Debriement  . HEMORRHOID BANDING  05/03/2016  . MASS EXCISION Right 12/05/2014   Procedure: EXCISION MASS, index finger;  Surgeon: Betha LoaKevin Kuzma, MD;  Location: [facility];  Service: Orthopedics;  Laterality: Right;  . NASAL SEPTUM SURGERY    . right calcaneous surgery    . TUMOR EXCISION Left    left lower leg skin  . ulna nerve release Right 2007  . ulna nerve release Left 2007  . ULNAR NERVE TRANSPOSITION Right 2009  . VAGINAL HYSTERECTOMY  2000    Allergies  Allergen Reactions  . Amoxicillin     Diarrhea   . Adhesive [Tape] Rash  . Latex Rash  She says she has a mild reaction to this    Social History   Social History  . Marital status: Married    Spouse name: N/A  . Number of children: 1  . Years of education: N/A   Occupational History  . cafe mgr/ referee    Social History Main Topics  . Smoking status: Former Smoker    Packs/day: 1.00    Types: Cigarettes    Quit date: 09/06/1998  . Smokeless tobacco: Never Used  . Alcohol use 8.4 oz/week    14 Glasses of wine per week     Comment: 0-2 per day  . Drug use: No  . Sexual activity: Yes    Birth control/ protection: Post-menopausal     Comment: Hysterectomy   Other Topics Concern  . 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 Coronary artery disease Maternal Grandfather   . Heart disease Maternal Grandfather    . Hypertension Father   . Stroke Father   . Colon polyps Father   . Hypertension Mother   . Diabetes Mother   . Colon polyps Mother     precancerous  . Heart attack Mother   . Hypertension Brother   . Colon polyps Brother   . Diabetes Brother   . Colon cancer Neg Hx   . Stomach cancer Neg Hx   . Esophageal cancer Neg Hx   . Rectal cancer Neg Hx     BP 132/84   Pulse (!) 56   Ht 5\' 6"  (1.676 m)   Wt 140 lb (63.5 kg)   BMI 22.60 kg/m   Review of Systems: See HPI above.     Objective:  Physical Exam:  Gen: NAD, comfortable in exam room  Left leg/hip: No gross deformity, swelling. TTP proximal IT band including trochanter.  No other bony TTP. FROM with 5/5 strength except 5-/5 with hip abduction. Negative SLRs. Sensation intact to light touch bilaterally. Negative logroll bilateral hips Negative fabers and piriformis stretches. Positive ober's. Negative hop and fulcrum tests.  Assessment & Plan:  1. Left hip pain - consistent with proximal IT band syndrome.  Shown home exercises and stretches to do daily.  Tylenol or aleve if needed.  Consider physical therapy, bursa injection if not improving.  F/u in 6 weeks.  Activities as tolerated.

## 2016-09-05 NOTE — Assessment & Plan Note (Signed)
consistent with proximal IT band syndrome.  Shown home exercises and stretches to do daily.  Tylenol or aleve if needed.  Consider physical therapy, bursa injection if not improving.  F/u in 6 weeks.  Activities as tolerated.

## 2016-09-29 ENCOUNTER — Telehealth: Payer: Self-pay | Admitting: Internal Medicine

## 2016-09-29 ENCOUNTER — Ambulatory Visit: Payer: BC Managed Care – PPO | Admitting: Family Medicine

## 2016-09-29 NOTE — Telephone Encounter (Signed)
Patient has been scheduled for 10/18/16

## 2016-10-05 ENCOUNTER — Other Ambulatory Visit: Payer: Self-pay | Admitting: Family Medicine

## 2016-10-13 LAB — HM MAMMOGRAPHY

## 2016-10-18 ENCOUNTER — Ambulatory Visit (INDEPENDENT_AMBULATORY_CARE_PROVIDER_SITE_OTHER): Payer: BC Managed Care – PPO | Admitting: Internal Medicine

## 2016-10-18 ENCOUNTER — Encounter: Payer: Self-pay | Admitting: Internal Medicine

## 2016-10-18 DIAGNOSIS — K648 Other hemorrhoids: Secondary | ICD-10-CM

## 2016-10-18 NOTE — Patient Instructions (Signed)
HEMORRHOID BANDING PROCEDURE    FOLLOW-UP CARE   1. The procedure you have had should have been relatively painless since the banding of the area involved does not have nerve endings and there is no pain sensation.  The rubber band cuts off the blood supply to the hemorrhoid and the band may fall off as soon as 48 hours after the banding (the band may occasionally be seen in the toilet bowl following a bowel movement). You may notice a temporary feeling of fullness in the rectum which should respond adequately to plain Tylenol or Motrin.  2. Following the banding, avoid strenuous exercise that evening and resume full activity the next day.  A sitz bath (soaking in a warm tub) or bidet is soothing, and can be useful for cleansing the area after bowel movements.     3. To avoid constipation, take two tablespoons of natural wheat bran, natural oat bran, flax, Benefiber or any over the counter fiber supplement and increase your water intake to 7-8 glasses daily.    4. Unless you have been prescribed anorectal medication, do not put anything inside your rectum for two weeks: No suppositories, enemas, fingers, etc.  5. Occasionally, you may have more bleeding than usual after the banding procedure.  This is often from the untreated hemorrhoids rather than the treated one.  Don't be concerned if there is a tablespoon or so of blood.  If there is more blood than this, lie flat with your bottom higher than your head and apply an ice pack to the area. If the bleeding does not stop within a half an hour or if you feel faint, call our office at [phone] or go to the emergency room.  6. Problems are not common; however, if there is a substantial amount of bleeding, severe pain, chills, fever or difficulty passing urine (very rare) or other problems, you should call us at [phone] or report to the nearest emergency room.  7. Do not stay seated continuously for more than 2-3 hours for a day or two  after the procedure.  Tighten your buttock muscles 10-15 times every two hours and take 10-15 deep breaths every 1-2 hours.  Do not spend more than a few minutes on the toilet if you cannot empty your bowel; instead re-visit the toilet at a later time.    Follow up with Dr Gessner as needed.    I appreciate the opportunity to care for you. Carl Gessner, MD, FACG 

## 2016-10-18 NOTE — Progress Notes (Signed)
    Recurrent rectal bleeding after banding of internal hemorrhoids last fall  Patti SwazilandJordan, CMA present.  Rectal: Small Ra fleshy tag  Anoscopy was performed with the patient in the left lateral decubitus position while a chaperone was present and revealed Gr 1 mildly inflamed RA and LL internal hemorrhoids  PROCEDURE NOTE: The patient presents with symptomatic grade 1 hemorrhoids, requesting rubber band ligation of his/her hemorrhoidal disease.  All risks, benefits and alternative forms of therapy were described and informed consent was obtained.   The anorectum was pre-medicated with 0.125% NTG/5% lidocaine The decision was made to band the RA and LL internal hemorrhoid, and the [facility] O'Regan System was used to perform band ligation without complication. Non-latex bands used Digital anorectal examination was then performed to assure proper positioning of the band, and to adjust the banded tissue as required.  The patient was discharged home without pain or other issues.  Dietary and behavioral recommendations were given and along with follow-up instructions.       The patient will return in as needed for  follow-up and possible additional banding as required. No complications were encountered and the patient tolerated the procedure well.

## 2016-10-18 NOTE — Assessment & Plan Note (Signed)
RA and LL banded again. Explained that nonlatex bands are less effective than the latex bands. Hopefully this will permanently or at least long-term relief for her symptoms. See me as needed.

## 2016-10-20 ENCOUNTER — Other Ambulatory Visit: Payer: Self-pay | Admitting: Family Medicine

## 2016-10-20 ENCOUNTER — Encounter: Payer: Self-pay | Admitting: Family Medicine

## 2016-10-25 ENCOUNTER — Encounter: Payer: Self-pay | Admitting: Podiatry

## 2016-10-25 ENCOUNTER — Ambulatory Visit (INDEPENDENT_AMBULATORY_CARE_PROVIDER_SITE_OTHER): Payer: BC Managed Care – PPO | Admitting: Podiatry

## 2016-10-25 ENCOUNTER — Ambulatory Visit (INDEPENDENT_AMBULATORY_CARE_PROVIDER_SITE_OTHER): Payer: BC Managed Care – PPO

## 2016-10-25 VITALS — BP 124/72 | HR 52 | Resp 16 | Ht 66.0 in | Wt 137.0 lb

## 2016-10-25 DIAGNOSIS — M79609 Pain in unspecified limb: Secondary | ICD-10-CM

## 2016-10-25 DIAGNOSIS — B351 Tinea unguium: Secondary | ICD-10-CM | POA: Diagnosis not present

## 2016-10-25 DIAGNOSIS — M79672 Pain in left foot: Secondary | ICD-10-CM

## 2016-10-25 DIAGNOSIS — M2011 Hallux valgus (acquired), right foot: Secondary | ICD-10-CM

## 2016-10-25 DIAGNOSIS — L603 Nail dystrophy: Secondary | ICD-10-CM

## 2016-10-25 DIAGNOSIS — M79671 Pain in right foot: Secondary | ICD-10-CM

## 2016-10-25 DIAGNOSIS — M722 Plantar fascial fibromatosis: Secondary | ICD-10-CM

## 2016-10-25 DIAGNOSIS — Z79899 Other long term (current) drug therapy: Secondary | ICD-10-CM | POA: Diagnosis not present

## 2016-10-25 DIAGNOSIS — M21611 Bunion of right foot: Secondary | ICD-10-CM

## 2016-10-25 DIAGNOSIS — L608 Other nail disorders: Secondary | ICD-10-CM

## 2016-10-25 NOTE — Progress Notes (Signed)
   Subjective:    Patient ID: Robin Mueller, female    DOB: 05-28-1961, 56 y.o.   MRN: [id number]  HPI  Review of Systems  HENT: Positive for ear pain, sinus pressure and tinnitus.   Musculoskeletal: Positive for arthralgias.  All other systems reviewed and are negative.      Objective:   Physical Exam        Assessment & Plan:

## 2016-11-02 MED ORDER — BETAMETHASONE SOD PHOS & ACET 6 (3-3) MG/ML IJ SUSP: 3.0000 mg | Freq: Once | INTRAMUSCULAR | Status: DC

## 2016-11-02 NOTE — Progress Notes (Signed)
Patient ID: Robin Mueller, female   DOB: 01/08/1961, 56 y.o.   MRN: [id number]   Subjective: Patient presents today as a new patient for evaluation of multiple complaints. Patient complains of painful symptomatic bunion deformity more symptomatic on the right lower extremity. Patient also has a right mid arch pain that is been going on for several weeks now. There are no alleviation of symptoms. Patient denies trauma. Patient also complains of possible fungal nails to the bilateral great toes. She states that they have been discolored and thickened for several months now. She denies trauma. Patient is used over-the-counter antifungal medication which has not helped.  Objective: Physical Exam General: The patient is alert and oriented x3 in no acute distress.  Dermatology: Hyperkeratotic, discolored toenails noted to the bilateral great toes. Skin is warm, dry and supple bilateral lower extremities. Negative for open lesions or macerations bilateral.   Vascular: Dorsalis Pedis and Posterior Tibial pulses palpable bilateral.  Capillary fill time is immediate to all digits.  Neurological: Epicritic and protective threshold intact bilateral.   Musculoskeletal: Tenderness to palpation at the mid substance of the medial longitudinal arch of the plantar fascia right foot. All other joints range of motion within normal limits bilateral. Strength 5/5 in all groups bilateral. Clinical evaluation indicates hallux abductovalgus with bunion deformity noted to the right lower extremity.   Radiographic exam: Increased intermetatarsal angle noted to the first metatarsal greater than 15. Hallux abductus angle noted greater than 30.  Assessment: 1. Plantar fasciitis right 2. Pain in right foot  3. Hallux abductovalgus with bunion right foot 4. Possible onychomycosis bilateral great toenails  Plan of Care:  1. Patient evaluated. Xrays reviewed.   2. Injection of 0.5cc Celestone soluspan injected  into the mid substance of the plantar fascia just proximal to the sesamoidal apparatus. 3. Instructed patient regarding therapies and modalities at home to alleviate symptoms.  4. Liver function tests ordered today. 5. Nail biopsy was performed of the bilateral great toes 6. Continue topical antifungal nail lacquer. 7. Today the patient was scanned for custom molded orthotics 8. Our office will let the patient know regarding fungal nail biopsy results and liver function tests. If the patient is positive for onychomycosis we will prescribe oral terbinafine 250 mg #90. 9. Return to clinic in 4 months  Felecia ShellingBrent M. Taleyah Hillman, DPM [facility]  Dr. Felecia ShellingBrent M. Tomaz Janis, DPM    [address]                                        [place], [place] [address]                Office [phone]  Fax [phone]

## 2016-11-22 ENCOUNTER — Telehealth: Payer: Self-pay | Admitting: *Deleted

## 2016-11-22 NOTE — Telephone Encounter (Addendum)
Left message reminding pt of the blood work needed prior to prescribing Lamisil, at Dr. Logan BoresEvans request. Pt returned call left name and phone. Left message encouraging pt to have blood work drawn.

## 2016-11-30 ENCOUNTER — Telehealth: Payer: Self-pay | Admitting: Podiatry

## 2016-11-30 NOTE — Telephone Encounter (Signed)
Pt called and is interested in the laser treatment. Is this an option for this pt? Has it been discussed? Please advise

## 2016-11-30 NOTE — Telephone Encounter (Signed)
Yes, patient is positive for onychomycosis. Please schedule appointment with Shanda BumpsJessica for laser.  Thanks, Dr. Logan BoresEvans

## 2016-11-30 NOTE — Telephone Encounter (Signed)
Routed scheduling orders for laser to D. Miner - Scheduler.

## 2016-12-07 ENCOUNTER — Ambulatory Visit: Payer: BC Managed Care – PPO

## 2016-12-07 DIAGNOSIS — B351 Tinea unguium: Secondary | ICD-10-CM

## 2016-12-07 DIAGNOSIS — M722 Plantar fascial fibromatosis: Secondary | ICD-10-CM

## 2016-12-07 NOTE — Patient Instructions (Signed)

## 2016-12-09 ENCOUNTER — Other Ambulatory Visit: Payer: Self-pay | Admitting: Family Medicine

## 2016-12-09 NOTE — Telephone Encounter (Signed)
Last OV 07/15/16 (CPE) Ibuprofen last filled 10/20/16 #60 with 1

## 2016-12-09 NOTE — Progress Notes (Signed)
Pt presents with mycotic infection of nails. 1st and 5th bilateral  All other systems are negative  Laser therapy administered to affected nails and tolerated well. All safety precautions were in place. Re-appointed in 4 weeks for 2nd treatment   Patient presents for orthotic pick up.  Verbal and written break in and wear instructions given.  Patient will follow up in 4 weeks if symptoms worsen or fail to improve.

## 2016-12-24 ENCOUNTER — Other Ambulatory Visit: Payer: Self-pay | Admitting: Family Medicine

## 2017-01-04 ENCOUNTER — Other Ambulatory Visit (INDEPENDENT_AMBULATORY_CARE_PROVIDER_SITE_OTHER): Payer: BC Managed Care – PPO

## 2017-01-04 DIAGNOSIS — E7800 Pure hypercholesterolemia, unspecified: Secondary | ICD-10-CM | POA: Diagnosis not present

## 2017-01-04 LAB — LIPID PANEL
CHOLESTEROL: 203 mg/dL — AB (ref 0–200)
HDL: 68.5 mg/dL (ref >39.00)
LDL CALC: 115 mg/dL — AB (ref 0–99)
NonHDL: 134.18
TRIGLYCERIDES: 96 mg/dL (ref 0.0–149.0)
Total CHOL/HDL Ratio: 3
VLDL: 19.2 mg/dL (ref 0.0–40.0)

## 2017-01-04 NOTE — Addendum Note (Signed)
Addended by: Lenis Dickinson on: 01/04/2017 08:07 AM   Modules accepted: Orders

## 2017-01-06 ENCOUNTER — Ambulatory Visit (INDEPENDENT_AMBULATORY_CARE_PROVIDER_SITE_OTHER): Payer: Self-pay | Admitting: Podiatry

## 2017-01-06 DIAGNOSIS — B351 Tinea unguium: Secondary | ICD-10-CM

## 2017-01-10 NOTE — Progress Notes (Signed)
Pt presents with mycotic infection of nails. 1st and 5th bilateral  All other systems are negative  Laser therapy administered to affected nails and tolerated well. All safety precautions were in place. Re-appointed in 4 weeks for 3rd treatment

## 2017-01-20 ENCOUNTER — Encounter: Payer: Self-pay | Admitting: Family Medicine

## 2017-02-17 ENCOUNTER — Other Ambulatory Visit: Payer: Self-pay | Admitting: Internal Medicine

## 2017-03-01 ENCOUNTER — Encounter: Payer: Self-pay | Admitting: Physician Assistant

## 2017-03-01 ENCOUNTER — Encounter: Payer: Self-pay | Admitting: Family Medicine

## 2017-03-01 ENCOUNTER — Ambulatory Visit (INDEPENDENT_AMBULATORY_CARE_PROVIDER_SITE_OTHER): Payer: BC Managed Care – PPO | Admitting: Physician Assistant

## 2017-03-01 VITALS — BP 110/80 | HR 69 | Temp 97.9°F | Resp 17 | Ht 66.0 in | Wt 141.2 lb

## 2017-03-01 DIAGNOSIS — M654 Radial styloid tenosynovitis [de Quervain]: Secondary | ICD-10-CM | POA: Diagnosis not present

## 2017-03-01 DIAGNOSIS — J0100 Acute maxillary sinusitis, unspecified: Secondary | ICD-10-CM | POA: Diagnosis not present

## 2017-03-01 MED ORDER — SULFAMETHOXAZOLE-TRIMETHOPRIM 800-160 MG PO TABS: 1.0000 | ORAL_TABLET | Freq: Two times a day (BID) | ORAL | 0 refills | Status: DC

## 2017-03-01 NOTE — Patient Instructions (Signed)
For Wrist: Wear the brace as directed during the day over the next 1-2 weeks. Avoid heavy lifting. Apply topical anti-inflammatory like Icy Hot or Aspercreme to the area. Alterate tylenol and Ibuprofen as needed for pain. Follow-up if not resolving.  For Sinuses: Please take antibiotic as directed.  Increase fluid intake.  Use Saline nasal spray.  Take a daily multivitamin. Continue allergy medication.  Place a humidifier in the bedroom.  Please call or return clinic if symptoms are not improving.  De Quervain Tenosynovitis Tendons attach muscles to bones. They also help with joint movements. When tendons become irritated or swollen, it is called tendinitis. The extensor pollicis brevis (EPB) tendon connects the EPB muscle to a bone that is near the base of the thumb. The EPB muscle helps to straighten and extend the thumb. De Quervain tenosynovitis is a condition in which the EPB tendon lining (sheath) becomes irritated, thickened, and swollen. This condition is sometimes called stenosing tenosynovitis. This condition causes pain on the thumb side of the back of the wrist. What are the causes? Causes of this condition include:  Activities that repeatedly cause your thumb and wrist to extend.  A sudden increase in activity or change in activity that affects your wrist.  What increases the risk? This condition is more likely to develop in:  Females.  People who have diabetes.  Women who have recently given birth.  People who are over 54 years of age.  People who do activities that involve repeated hand and wrist motions, such as tennis, racquetball, volleyball, gardening, and taking care of children.  People who do heavy labor.  People who have poor wrist strength and flexibility.  People who do not warm up properly before activities.  What are the signs or symptoms? Symptoms of this condition include:  Pain or tenderness over the thumb side of the back of the wrist when your  thumb and wrist are not moving.  Pain that gets worse when you straighten your thumb or extend your thumb or wrist.  Pain when the injured area is touched.  Locking or catching of the thumb joint while you bend and straighten your thumb.  Decreased thumb motion due to pain.  Swelling over the affected area.  How is this diagnosed? This condition is diagnosed with a medical history and physical exam. Your health care provider will ask for details about your injury and ask about your symptoms. How is this treated? Treatment may include the use of icing and medicines to reduce pain and swelling. You may also be advised to wear a splint or brace to limit your thumb and wrist motion. In less severe cases, treatment may also include working with a physical therapist to strengthen your wrist and calm the irritation around your EPB tendon sheath. In severe cases, surgery may be needed. Follow these instructions at home: If you have a splint or brace:  Wear it as told by your health care provider. Remove it only as told by your health care provider.  Loosen the splint or brace if your fingers become numb and tingle, or if they turn cold and blue.  Keep the splint or brace clean and dry. Managing pain, stiffness, and swelling  If directed, apply ice to the injured area. ? Put ice in a plastic bag. ? Place a towel between your skin and the bag. ? Leave the ice on for 20 minutes, 2-3 times per day.  Move your fingers often to avoid stiffness and to lessen  swelling.  Raise (elevate) the injured area above the level of your heart while you are sitting or lying down. General instructions  Return to your normal activities as told by your health care provider. Ask your health care provider what activities are safe for you.  Take over-the-counter and prescription medicines only as told by your health care provider.  Keep all follow-up visits as told by your health care provider. This is  important.  Do not drive or operate heavy machinery while taking prescription pain medicine. Contact a health care provider if:  Your pain, tenderness, or swelling gets worse, even if you have had treatment.  You have numbness or tingling in your wrist, hand, or fingers on the injured side. This information is not intended to replace advice given to you by your health care provider. Make sure you discuss any questions you have with your health care provider. Document Released: 08/23/2005 Document Revised: 01/29/2016 Document Reviewed: 10/29/2014 Elsevier Interactive Patient Education  2018 [employer].   Sinusitis Sinusitis is redness, soreness, and swelling (inflammation) of the paranasal sinuses. Paranasal sinuses are air pockets within the bones of your face (beneath the eyes, the middle of the forehead, or above the eyes). In healthy paranasal sinuses, mucus is able to drain out, and air is able to circulate through them by way of your nose. However, when your paranasal sinuses are inflamed, mucus and air can become trapped. This can allow bacteria and other germs to grow and cause infection. Sinusitis can develop quickly and last only a short time (acute) or continue over a long period (chronic). Sinusitis that lasts for more than 12 weeks is considered chronic.  CAUSES  Causes of sinusitis include:  Allergies.  Structural abnormalities, such as displacement of the cartilage that separates your nostrils (deviated septum), which can decrease the air flow through your nose and sinuses and affect sinus drainage.  Functional abnormalities, such as when the small hairs (cilia) that line your sinuses and help remove mucus do not work properly or are not present. SYMPTOMS  Symptoms of acute and chronic sinusitis are the same. The primary symptoms are pain and pressure around the affected sinuses. Other symptoms include:  Upper toothache.  Earache.  Headache.  Bad breath.  Decreased  sense of smell and taste.  A cough, which worsens when you are lying flat.  Fatigue.  Fever.  Thick drainage from your nose, which often is green and may contain pus (purulent).  Swelling and warmth over the affected sinuses. DIAGNOSIS  Your caregiver will perform a physical exam. During the exam, your caregiver may:  Look in your nose for signs of abnormal growths in your nostrils (nasal polyps).  Tap over the affected sinus to check for signs of infection.  View the inside of your sinuses (endoscopy) with a special imaging device with a light attached (endoscope), which is inserted into your sinuses. If your caregiver suspects that you have chronic sinusitis, one or more of the following tests may be recommended:  Allergy tests.  Nasal culture A sample of mucus is taken from your nose and sent to a lab and screened for bacteria.  Nasal cytology A sample of mucus is taken from your nose and examined by your caregiver to determine if your sinusitis is related to an allergy. TREATMENT  Most cases of acute sinusitis are related to a viral infection and will resolve on their own within 10 days. Sometimes medicines are prescribed to help relieve symptoms (pain medicine, decongestants, nasal  steroid sprays, or saline sprays).  However, for sinusitis related to a bacterial infection, your caregiver will prescribe antibiotic medicines. These are medicines that will help kill the bacteria causing the infection.  Rarely, sinusitis is caused by a fungal infection. In theses cases, your caregiver will prescribe antifungal medicine. For some cases of chronic sinusitis, surgery is needed. Generally, these are cases in which sinusitis recurs more than 3 times per year, despite other treatments. HOME CARE INSTRUCTIONS   Drink plenty of water. Water helps thin the mucus so your sinuses can drain more easily.  Use a humidifier.  Inhale steam 3 to 4 times a day (for example, sit in the bathroom  with the shower running).  Apply a warm, moist washcloth to your face 3 to 4 times a day, or as directed by your caregiver.  Use saline nasal sprays to help moisten and clean your sinuses.  Take over-the-counter or prescription medicines for pain, discomfort, or fever only as directed by your caregiver. SEEK IMMEDIATE MEDICAL CARE IF:  You have increasing pain or severe headaches.  You have nausea, vomiting, or drowsiness.  You have swelling around your face.  You have vision problems.  You have a stiff neck.  You have difficulty breathing. MAKE SURE YOU:   Understand these instructions.  Will watch your condition.  Will get help right away if you are not doing well or get worse. Document Released: 08/23/2005 Document Revised: 11/15/2011 Document Reviewed: 09/07/2011 [facility] Patient Information 2014 [place], [place].

## 2017-03-01 NOTE — Progress Notes (Signed)
Pre visit review using our clinic review tool, if applicable. No additional management support is needed unless otherwise documented below in the visit note. 

## 2017-03-01 NOTE — Progress Notes (Signed)
Patient presents to clinic today c/o 2 weeks of sinus pressure, sins pressure, maxillary sinus pain, L ear pain. Denies fever, chills. Endorses dry cough. Denies chest pain or shortness of breath. Denies recent travel. Denies sick contact. Patient with history significant for maxillary sinusitis. Has thankfully not had an infection since October.  Has taken Mucinex and chronic allergy medications with little improvement.   Patient also endorses pain is R wrist. Patient endorses pain in area after falling onto outstretched hand 3 weeks ago. Noted pain for a couple of days after before resolving. Notes pain recurring over the past couple of days. Has been very active this past weekend and has noted pain since then. Pain is in medial wrist and described an aching with motion.    Past Medical History:  Diagnosis Date  . Allergic rhinitis   . Allergy   . Anxiety   . Arthritis    hands, feet,back - otc med prn  . Colon polyp   . Depression   . Dysrhythmia    hx pvc,pac  . GERD (gastroesophageal reflux disease)   . IBS (irritable bowel syndrome)    diet controlled  . Internal hemorrhoid   . PONV (postoperative nausea and vomiting)   . Rosacea     Current Outpatient Prescriptions on File Prior to Visit  Medication Sig Dispense Refill  . Ascorbic Acid (VITAMIN C) 100 MG CHEW Chew by mouth daily.      Marland Kitchen. b complex vitamins tablet Take 1 tablet by mouth daily.    Marland Kitchen. BIOTIN PO Take by mouth daily.    . Calcium 600-200 MG-UNIT per tablet Take 1 tablet by mouth daily.      Marland Kitchen. DEXILANT 30 MG capsule TAKE 1 CAPSULE (30 MG TOTAL) BY MOUTH DAILY. 90 capsule 3  . DYMISTA 137-50 MCG/ACT SUSP USE 1 TO 2 SPRAYS IN EACH NOSTRIL ONCE DAILY 23 g 0  . Fexofenadine HCl (MUCINEX ALLERGY PO) Take by mouth as needed.    . fish oil-omega-3 fatty acids 1000 MG capsule Take 1 g by mouth daily.      . Flaxseed, Linseed, (FLAX SEED OIL) 1000 MG CAPS Take by mouth daily.      . hydroquinone 4 % cream Apply topically  2 (two) times daily.  2  . ibuprofen (ADVIL,MOTRIN) 800 MG tablet TAKE 1 TABLET BY MOUTH EVERY 8 HOURS AS NEEDED 60 tablet 1  . TURMERIC PO Take by mouth.    . ALPRAZolam (XANAX) 0.25 MG tablet Take 1 tablet (0.25 mg total) by mouth as directed. 1-2 tabs po q6 hours prn anxiety (Patient not taking: Reported on 03/01/2017) 60 tablet 0   No current facility-administered medications on file prior to visit.     Allergies  Allergen Reactions  . Amoxicillin     Diarrhea   . Adhesive [Tape] Rash  . Latex Rash    She says she has a mild reaction to this    Family History  Problem Relation Age of Onset  . Coronary artery disease Maternal Grandfather   . Heart disease Maternal Grandfather   . Hypertension Father   . Stroke Father   . Colon polyps Father   . Hypertension Mother   . Diabetes Mother   . Colon polyps Mother        precancerous  . Heart attack Mother   . Hypertension Brother   . Colon polyps Brother   . Diabetes Brother   . Colon cancer Neg Hx   .  Stomach cancer Neg Hx   . Esophageal cancer Neg Hx   . Rectal cancer Neg Hx     Social History   Social History  . Marital status: Married    Spouse name: N/A  . Number of children: 1  . Years of education: N/A   Occupational History  . cafe mgr/ referee    Social History Main Topics  . Smoking status: Former Smoker    Packs/day: 1.00    Types: Cigarettes    Quit date: 09/06/1998  . Smokeless tobacco: Never Used  . Alcohol use 8.4 oz/week    14 Glasses of wine per week     Comment: 0-2 per day  . Drug use: No  . Sexual activity: Yes    Birth control/ protection: Post-menopausal     Comment: Hysterectomy   Other Topics Concern  . 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  All other ROS are negative.  BP 110/80   Pulse 69   Temp 97.9 F (36.6 C)  (Oral)   Resp 17   Ht 5\' 6"  (1.676 m)   Wt 141 lb 4 oz (64.1 kg)   SpO2 98%   BMI 22.80 kg/m   Physical Exam  Constitutional: She is oriented to person, place, and time and well-developed, well-nourished, and in no distress.  HENT:  Head: Normocephalic and atraumatic.  Right Ear: Tympanic membrane normal.  Left Ear: Tympanic membrane is not erythematous and not bulging. A middle ear effusion is present.  Nose: Mucosal edema and rhinorrhea present. Right sinus exhibits maxillary sinus tenderness. Left sinus exhibits maxillary sinus tenderness.  Mouth/Throat: Uvula is midline, oropharynx is clear and moist and mucous membranes are normal.  Eyes: Conjunctivae are normal.  Neck: Neck supple.  Cardiovascular: Normal rate, regular rhythm, normal heart sounds and intact distal pulses.   Pulmonary/Chest: Effort normal and breath sounds normal. No respiratory distress. She has no wheezes. She has no rales. She exhibits no tenderness.  Musculoskeletal:       Right wrist: She exhibits tenderness. She exhibits normal range of motion, no bony tenderness, no swelling and no deformity.  + finkelstein test R hand/wrist  Neurological: She is alert and oriented to person, place, and time.  Skin: Skin is warm. No rash noted.  Psychiatric: Affect normal.    Recent Results (from the past 2160 hour(s))  Lipid panel     Status: Abnormal   Collection Time: 01/04/17  8:07 AM  Result Value Ref Range   Cholesterol 203 (H) 0 - 200 mg/dL    Comment: ATP III Classification       Desirable:  < 200 mg/dL               Borderline High:  200 - 239 mg/dL          High:  > = 161 mg/dL   Triglycerides 09.6 0.0 - 149.0 mg/dL    Comment: Normal:  <045 mg/dLBorderline High:  150 - 199 mg/dL   HDL 40.98 >11.91 mg/dL   VLDL 47.8 0.0 - 29.5 mg/dL   LDL Cholesterol 621 (H) 0 - 99 mg/dL   Total CHOL/HDL Ratio 3     Comment:                Men  Women1/2 Average Risk     3.4          3.3Average Risk          5.0           4.42X Average Risk          9.6          7.13X Average Risk          15.0          11.0                       NonHDL 134.18     Comment: NOTE:  Non-HDL goal should be 30 mg/dL higher than patient's LDL goal (i.e. LDL goal of < 70 mg/dL, would have non-HDL goal of < 100 mg/dL)   Assessment/Plan: 1. Acute non-recurrent maxillary sinusitis Rx Bactrim.  Increase fluids.  Rest.  Saline nasal spray.  Probiotic.  Mucinex as directed.  Humidifier in bedroom. Continue allergy medications.  Call or return to clinic if symptoms are not improving.  - sulfamethoxazole-trimethoprim (BACTRIM DS,SEPTRA DS) 800-160 MG tablet; Take 1 tablet by mouth 2 (two) times daily.  Dispense: 14 tablet; Refill: 0  2. De Quervain's tenosynovitis, right Brace not available in clinic -- she is to get OTC thumb spica splint. Supportive measures and OTC medications reviewed. Follow-up if not resolving.   Piedad Climes, PA-C

## 2017-03-10 ENCOUNTER — Encounter: Payer: Self-pay | Admitting: Family Medicine

## 2017-03-11 ENCOUNTER — Encounter: Payer: Self-pay | Admitting: Physician Assistant

## 2017-03-11 ENCOUNTER — Ambulatory Visit (INDEPENDENT_AMBULATORY_CARE_PROVIDER_SITE_OTHER): Payer: BC Managed Care – PPO | Admitting: Physician Assistant

## 2017-03-11 VITALS — BP 108/78 | HR 63 | Temp 97.7°F | Resp 14 | Ht 66.0 in | Wt 140.0 lb

## 2017-03-11 DIAGNOSIS — J0100 Acute maxillary sinusitis, unspecified: Secondary | ICD-10-CM | POA: Diagnosis not present

## 2017-03-11 MED ORDER — METHYLPREDNISOLONE 4 MG PO TBPK: ORAL_TABLET | ORAL | 0 refills | Status: DC

## 2017-03-11 MED ORDER — CEFDINIR 300 MG PO CAPS: 300.0000 mg | ORAL_CAPSULE | Freq: Two times a day (BID) | ORAL | 0 refills | Status: DC

## 2017-03-11 NOTE — Patient Instructions (Signed)
Please take antibiotic as directed.  Increase fluid intake.  Use Saline nasal spray.  Take a daily multivitamin. Take steroid pack as directed.  Place a humidifier in the bedroom.  Please call or return clinic if symptoms are not improving.  Sinusitis Sinusitis is redness, soreness, and swelling (inflammation) of the paranasal sinuses. Paranasal sinuses are air pockets within the bones of your face (beneath the eyes, the middle of the forehead, or above the eyes). In healthy paranasal sinuses, mucus is able to drain out, and air is able to circulate through them by way of your nose. However, when your paranasal sinuses are inflamed, mucus and air can become trapped. This can allow bacteria and other germs to grow and cause infection. Sinusitis can develop quickly and last only a short time (acute) or continue over a long period (chronic). Sinusitis that lasts for more than 12 weeks is considered chronic.  CAUSES  Causes of sinusitis include:  Allergies.  Structural abnormalities, such as displacement of the cartilage that separates your nostrils (deviated septum), which can decrease the air flow through your nose and sinuses and affect sinus drainage.  Functional abnormalities, such as when the small hairs (cilia) that line your sinuses and help remove mucus do not work properly or are not present. SYMPTOMS  Symptoms of acute and chronic sinusitis are the same. The primary symptoms are pain and pressure around the affected sinuses. Other symptoms include:  Upper toothache.  Earache.  Headache.  Bad breath.  Decreased sense of smell and taste.  A cough, which worsens when you are lying flat.  Fatigue.  Fever.  Thick drainage from your nose, which often is green and may contain pus (purulent).  Swelling and warmth over the affected sinuses. DIAGNOSIS  Your caregiver will perform a physical exam. During the exam, your caregiver may:  Look in your nose for signs of abnormal growths  in your nostrils (nasal polyps).  Tap over the affected sinus to check for signs of infection.  View the inside of your sinuses (endoscopy) with a special imaging device with a light attached (endoscope), which is inserted into your sinuses. If your caregiver suspects that you have chronic sinusitis, one or more of the following tests may be recommended:  Allergy tests.  Nasal culture A sample of mucus is taken from your nose and sent to a lab and screened for bacteria.  Nasal cytology A sample of mucus is taken from your nose and examined by your caregiver to determine if your sinusitis is related to an allergy. TREATMENT  Most cases of acute sinusitis are related to a viral infection and will resolve on their own within 10 days. Sometimes medicines are prescribed to help relieve symptoms (pain medicine, decongestants, nasal steroid sprays, or saline sprays).  However, for sinusitis related to a bacterial infection, your caregiver will prescribe antibiotic medicines. These are medicines that will help kill the bacteria causing the infection.  Rarely, sinusitis is caused by a fungal infection. In theses cases, your caregiver will prescribe antifungal medicine. For some cases of chronic sinusitis, surgery is needed. Generally, these are cases in which sinusitis recurs more than 3 times per year, despite other treatments. HOME CARE INSTRUCTIONS   Drink plenty of water. Water helps thin the mucus so your sinuses can drain more easily.  Use a humidifier.  Inhale steam 3 to 4 times a day (for example, sit in the bathroom with the shower running).  Apply a warm, moist washcloth to your face 3  to 4 times a day, or as directed by your caregiver.  Use saline nasal sprays to help moisten and clean your sinuses.  Take over-the-counter or prescription medicines for pain, discomfort, or fever only as directed by your caregiver. SEEK IMMEDIATE MEDICAL CARE IF:  You have increasing pain or severe  headaches.  You have nausea, vomiting, or drowsiness.  You have swelling around your face.  You have vision problems.  You have a stiff neck.  You have difficulty breathing. MAKE SURE YOU:   Understand these instructions.  Will watch your condition.  Will get help right away if you are not doing well or get worse. Document Released: 08/23/2005 Document Revised: 11/15/2011 Document Reviewed: 09/07/2011 [facility] Patient Information 2014 [place], [place].

## 2017-03-11 NOTE — Progress Notes (Signed)
Pre visit review using our clinic review tool, if applicable. No additional management support is needed unless otherwise documented below in the visit note. 

## 2017-03-11 NOTE — Progress Notes (Signed)
Patient presents to clinic today c/o continued sinus pressure, sinus pain and nasal congestion despite completion of a course of bactrim for acute sinusitis. Patient endorses symptoms improved slightly with Bactrim but recurred. Denies new or worsening symptoms. 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   Past Medical History:  Diagnosis Date  . Allergic rhinitis   . Allergy   . Anxiety   . Arthritis    hands, feet,back - otc med prn  . Colon polyp   . Depression   . Dysrhythmia    hx pvc,pac  . GERD (gastroesophageal reflux disease)   . IBS (irritable bowel syndrome)    diet controlled  . Internal hemorrhoid   . PONV (postoperative nausea and vomiting)   . Rosacea     Current Outpatient Prescriptions on File Prior to Visit  Medication Sig Dispense Refill  . Ascorbic Acid (VITAMIN C) 100 MG CHEW Chew by mouth daily.      Marland Kitchen. b complex vitamins tablet Take 1 tablet by mouth daily.    Marland Kitchen. BIOTIN PO Take by mouth daily.    . Calcium 600-200 MG-UNIT per tablet Take 1 tablet by mouth daily.      Marland Kitchen. DEXILANT 30 MG capsule TAKE 1 CAPSULE (30 MG TOTAL) BY MOUTH DAILY. 90 capsule 3  . DYMISTA 137-50 MCG/ACT SUSP USE 1 TO 2 SPRAYS IN EACH NOSTRIL ONCE DAILY 23 g 0  . Fexofenadine HCl (MUCINEX ALLERGY PO) Take by mouth as needed.    . fish oil-omega-3 fatty acids 1000 MG capsule Take 1 g by mouth daily.      . Flaxseed, Linseed, (FLAX SEED OIL) 1000 MG CAPS Take by mouth daily.      . hydroquinone 4 % cream Apply topically 2 (two) times daily.  2  . ibuprofen (ADVIL,MOTRIN) 800 MG tablet TAKE 1 TABLET BY MOUTH EVERY 8 HOURS AS NEEDED 60 tablet 1  . tretinoin (RETIN-A) 0.05 % cream Apply topically at bedtime.    . TURMERIC PO Take by mouth.     No current facility-administered medications on file prior to visit.     Allergies  Allergen Reactions  . Amoxicillin     Diarrhea   . Adhesive [Tape] Rash  . Latex Rash    She says she  has a mild reaction to this    Family History  Problem Relation Age of Onset  . Coronary artery disease Maternal Grandfather   . Heart disease Maternal Grandfather   . Hypertension Father   . Stroke Father   . Colon polyps Father   . Hypertension Mother   . Diabetes Mother   . Colon polyps Mother        precancerous  . Heart attack Mother   . Hypertension Brother   . Colon polyps Brother   . Diabetes Brother   . Colon cancer Neg Hx   . Stomach cancer Neg Hx   . Esophageal cancer Neg Hx   . Rectal cancer Neg Hx     Social History   Social History  . Marital status: Married    Spouse name: N/A  . Number of children: 1  . Years of education: N/A   Occupational History  . cafe mgr/ referee    Social History Main Topics  . Smoking status: Former Smoker    Packs/day: 1.00    Types: Cigarettes    Quit date: 09/06/1998  . Smokeless tobacco: Never Used  .  Alcohol use 8.4 oz/week    14 Glasses of wine per week     Comment: 0-2 per day  . Drug use: No  . Sexual activity: Yes    Birth control/ protection: Post-menopausal     Comment: Hysterectomy   Other Topics Concern  . 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  All other ROS are negative.  BP 108/78   Pulse 63   Temp 97.7 F (36.5 C) (Oral)   Resp 14   Ht 5\' 6"  (1.676 m)   Wt 140 lb (63.5 kg)   SpO2 98%   BMI 22.60 kg/m   Physical Exam  Constitutional: She is oriented to person, place, and time and well-developed, well-nourished, and in no distress.  HENT:  Head: Normocephalic and atraumatic.  Right Ear: Tympanic membrane normal.  Left Ear: Tympanic membrane normal.  Nose: Mucosal edema and rhinorrhea present. Right sinus exhibits maxillary sinus tenderness. Left sinus exhibits maxillary sinus tenderness.  Mouth/Throat: Uvula is midline, oropharynx  is clear and moist and mucous membranes are normal.  Eyes: Conjunctivae are normal.  Neck: Neck supple.  Cardiovascular: Normal rate, regular rhythm, normal heart sounds and intact distal pulses.   Pulmonary/Chest: Effort normal and breath sounds normal. No respiratory distress. She has no wheezes. She has no rales. She exhibits no tenderness.  Lymphadenopathy:    She has no cervical adenopathy.  Neurological: She is alert and oriented to person, place, and time.  Skin: Skin is warm and dry. No rash noted.  Psychiatric: Affect normal.  Vitals reviewed.   Recent Results (from the past 2160 hour(s))  Lipid panel     Status: Abnormal   Collection Time: 01/04/17  8:07 AM  Result Value Ref Range   Cholesterol 203 (H) 0 - 200 mg/dL    Comment: ATP III Classification       Desirable:  < 200 mg/dL               Borderline High:  200 - 239 mg/dL          High:  > = 161 mg/dL   Triglycerides 09.6 0.0 - 149.0 mg/dL    Comment: Normal:  <045 mg/dLBorderline High:  150 - 199 mg/dL   HDL 40.98 >11.91 mg/dL   VLDL 47.8 0.0 - 29.5 mg/dL   LDL Cholesterol 621 (H) 0 - 99 mg/dL   Total CHOL/HDL Ratio 3     Comment:                Men          Women1/2 Average Risk     3.4          3.3Average Risk          5.0          4.42X Average Risk          9.6          7.13X Average Risk          15.0          11.0                       NonHDL 134.18     Comment: NOTE:  Non-HDL goal should be 30 mg/dL  higher than patient's LDL goal (i.e. LDL goal of < 70 mg/dL, would have non-HDL goal of < 100 mg/dL)    Assessment/Plan: 1. Acute maxillary sinusitis, recurrence not specified Will start Cefdinir as patient gets severe diarrhea with penicillins and patient will be out in the sun all weekend without shade so having to avoid photo-toxic agents. Will start Medrol pack as directed. Supportive measures reviewed. ENT if not improving.    Piedad Climes, PA-C

## 2017-03-15 ENCOUNTER — Other Ambulatory Visit: Payer: Self-pay | Admitting: Internal Medicine

## 2017-03-30 ENCOUNTER — Other Ambulatory Visit: Payer: Self-pay | Admitting: Internal Medicine

## 2017-04-06 ENCOUNTER — Encounter: Payer: Self-pay | Admitting: Family Medicine

## 2017-04-06 MED ORDER — AZELASTINE-FLUTICASONE 137-50 MCG/ACT NA SUSP: NASAL | 6 refills | Status: DC

## 2017-04-11 ENCOUNTER — Ambulatory Visit: Payer: BC Managed Care – PPO | Admitting: Podiatry

## 2017-05-18 ENCOUNTER — Ambulatory Visit (INDEPENDENT_AMBULATORY_CARE_PROVIDER_SITE_OTHER): Payer: BC Managed Care – PPO | Admitting: Family Medicine

## 2017-05-18 ENCOUNTER — Encounter: Payer: Self-pay | Admitting: Family Medicine

## 2017-05-18 VITALS — BP 120/76 | HR 69 | Temp 98.6°F | Resp 18 | Ht 66.0 in | Wt 142.1 lb

## 2017-05-18 DIAGNOSIS — J0101 Acute recurrent maxillary sinusitis: Secondary | ICD-10-CM | POA: Diagnosis not present

## 2017-05-18 MED ORDER — SULFAMETHOXAZOLE-TRIMETHOPRIM 800-160 MG PO TABS: 1.0000 | ORAL_TABLET | Freq: Two times a day (BID) | ORAL | 0 refills | Status: DC

## 2017-05-18 NOTE — Progress Notes (Signed)
   Subjective:    Patient ID: Francella SolianKathleen A Dominic, female    DOB: 06-04-1961, 56 y.o.   MRN: [id number]  HPI URI- pt has hx of recurrent sinus infections.  sxs started August 16th when she returned to school.  Pt reports it 'switched' about 5 days ago from allergy sxs to severe HA, sinus pain/pressure.  No tooth pain 'yet'.  L ear pain.  + nasal congestion.     Review of Systems For ROS see HPI     Objective:   Physical Exam  Constitutional: She appears well-developed and well-nourished. No distress.  HENT:  Head: Normocephalic and atraumatic.  Right Ear: Tympanic membrane normal.  Left Ear: Tympanic membrane normal.  Nose: Mucosal edema and rhinorrhea present. Right sinus exhibits no maxillary sinus tenderness and no frontal sinus tenderness. Left sinus exhibits maxillary sinus tenderness and frontal sinus tenderness.  Mouth/Throat: Uvula is midline and mucous membranes are normal. Posterior oropharyngeal erythema present. No oropharyngeal exudate.  Eyes: Pupils are equal, round, and reactive to light. Conjunctivae and EOM are normal.  Neck: Normal range of motion. Neck supple.  Cardiovascular: Normal rate, regular rhythm and normal heart sounds.   Pulmonary/Chest: Effort normal and breath sounds normal. No respiratory distress. She has no wheezes.  Lymphadenopathy:    She has no cervical adenopathy.  Vitals reviewed.         Assessment & Plan:

## 2017-05-18 NOTE — Progress Notes (Signed)
Pre visit review using our clinic review tool, if applicable. No additional management support is needed unless otherwise documented below in the visit note. 

## 2017-05-18 NOTE — Patient Instructions (Signed)
Follow up as needed or as scheduled Start the Bactrim twice daily- take w/ food Drink plenty of fluids REST! Claritin or Zyrtec daily Call with any questions or concerns Stay safe this weekend!!!

## 2017-05-18 NOTE — Assessment & Plan Note (Signed)
Recurrent issue for pt.  sxs and PE consistent w/ infxn.  Start Bactrim twice daily.  Reviewed supportive care and red flags that should prompt return. Pt expressed understanding and is in agreement w/ plan.

## 2017-06-07 ENCOUNTER — Encounter: Payer: Self-pay | Admitting: Family Medicine

## 2017-07-21 ENCOUNTER — Other Ambulatory Visit: Payer: Self-pay | Admitting: Family Medicine

## 2017-09-29 ENCOUNTER — Ambulatory Visit (INDEPENDENT_AMBULATORY_CARE_PROVIDER_SITE_OTHER): Payer: BC Managed Care – PPO | Admitting: Family Medicine

## 2017-09-29 ENCOUNTER — Other Ambulatory Visit: Payer: Self-pay

## 2017-09-29 ENCOUNTER — Encounter: Payer: Self-pay | Admitting: Family Medicine

## 2017-09-29 VITALS — BP 121/80 | HR 78 | Temp 98.1°F | Resp 16 | Ht 66.0 in | Wt 143.5 lb

## 2017-09-29 DIAGNOSIS — E785 Hyperlipidemia, unspecified: Secondary | ICD-10-CM

## 2017-09-29 DIAGNOSIS — Z0000 Encounter for general adult medical examination without abnormal findings: Secondary | ICD-10-CM | POA: Diagnosis not present

## 2017-09-29 DIAGNOSIS — J329 Chronic sinusitis, unspecified: Secondary | ICD-10-CM

## 2017-09-29 LAB — CBC WITH DIFFERENTIAL/PLATELET
BASOS PCT: 0.8 % (ref 0.0–3.0)
Basophils Absolute: 0 10*3/uL (ref 0.0–0.1)
EOS ABS: 0.2 10*3/uL (ref 0.0–0.7)
Eosinophils Relative: 3.6 % (ref 0.0–5.0)
HEMATOCRIT: 40.5 % (ref 36.0–46.0)
Hemoglobin: 13.8 g/dL (ref 12.0–15.0)
LYMPHS ABS: 2.6 10*3/uL (ref 0.7–4.0)
LYMPHS PCT: 43.1 % (ref 12.0–46.0)
MCHC: 34.1 g/dL (ref 30.0–36.0)
MCV: 97.8 fl (ref 78.0–100.0)
Monocytes Absolute: 0.6 10*3/uL (ref 0.1–1.0)
Monocytes Relative: 9.4 % (ref 3.0–12.0)
NEUTROS ABS: 2.6 10*3/uL (ref 1.4–7.7)
Neutrophils Relative %: 43.1 % (ref 43.0–77.0)
PLATELETS: 294 10*3/uL (ref 150.0–400.0)
RBC: 4.14 Mil/uL (ref 3.87–5.11)
RDW: 12.4 % (ref 11.5–15.5)
WBC: 6.1 10*3/uL (ref 4.0–10.5)

## 2017-09-29 LAB — BASIC METABOLIC PANEL
BUN: 23 mg/dL (ref 6–23)
CALCIUM: 9.6 mg/dL (ref 8.4–10.5)
CHLORIDE: 102 meq/L (ref 96–112)
CO2: 29 meq/L (ref 19–32)
Creatinine, Ser: 0.81 mg/dL (ref 0.40–1.20)
GFR: 78.11 mL/min (ref >60.00)
Glucose, Bld: 96 mg/dL (ref 70–99)
POTASSIUM: 4.1 meq/L (ref 3.5–5.1)
SODIUM: 139 meq/L (ref 135–145)

## 2017-09-29 LAB — TSH: TSH: 3.69 u[IU]/mL (ref 0.35–4.50)

## 2017-09-29 LAB — HEPATIC FUNCTION PANEL
ALK PHOS: 42 U/L (ref 39–117)
ALT: 21 U/L (ref 0–35)
AST: 23 U/L (ref 0–37)
Albumin: 4.9 g/dL (ref 3.5–5.2)
BILIRUBIN DIRECT: 0.1 mg/dL (ref 0.0–0.3)
BILIRUBIN TOTAL: 0.7 mg/dL (ref 0.2–1.2)
Total Protein: 7.2 g/dL (ref 6.0–8.3)

## 2017-09-29 LAB — LIPID PANEL
CHOLESTEROL: 205 mg/dL — AB (ref 0–200)
HDL: 67.9 mg/dL (ref >39.00)
LDL Cholesterol: 107 mg/dL — ABNORMAL HIGH (ref 0–99)
NONHDL: 137.58
Total CHOL/HDL Ratio: 3
Triglycerides: 153 mg/dL — ABNORMAL HIGH (ref 0.0–149.0)
VLDL: 30.6 mg/dL (ref 0.0–40.0)

## 2017-09-29 NOTE — Assessment & Plan Note (Signed)
Pt's PE WNL.  UTD on mammo, colonoscopy, immunizations.  Check labs.  Anticipatory guidance provided.  

## 2017-09-29 NOTE — Patient Instructions (Signed)
Follow up in 1 year or as needed We'll notify you of your lab results and make any changes if needed We'll call you with your ENT appt Keep up the good work on healthy diet and regular exercise- you look great! Call with any questions or concerns Happy New Year!

## 2017-09-29 NOTE — Progress Notes (Signed)
   Subjective:    Patient ID: Francella SolianKathleen A Rovner, female    DOB: [birth year], 57 y.o.   MRN: [id number]  HPI CPE- UTD on mammo, colonoscopy.  No need for pap due to hysterectomy.  UTD on immunizations.  No concerns today.   Review of Systems Patient reports no vision/ hearing changes, adenopathy,fever, weight change,  persistant/recurrent hoarseness , swallowing issues, chest pain, palpitations, edema, persistant/recurrent cough, hemoptysis, dyspnea (rest/exertional/paroxysmal nocturnal), gastrointestinal bleeding (melena, rectal bleeding), abdominal pain, significant heartburn, bowel changes, GU symptoms (dysuria, hematuria, incontinence), Gyn symptoms (abnormal  bleeding, pain),  syncope, focal weakness, memory loss, numbness & tingling, skin/hair/nail changes, abnormal bruising or bleeding, anxiety, or depression.     Objective:   Physical Exam General Appearance:    Alert, cooperative, no distress, appears stated age  Head:    Normocephalic, without obvious abnormality, atraumatic  Eyes:    PERRL, conjunctiva/corneas clear, EOM's intact, fundi    benign, both eyes  Ears:    Normal TM's and external ear canals, both ears  Nose:   Nares normal, septum midline, mucosa normal, no drainage    or sinus tenderness  Throat:   Lips, mucosa, and tongue normal; teeth and gums normal  Neck:   Supple, symmetrical, trachea midline, no adenopathy;    Thyroid: no enlargement/tenderness/nodules  Back:     Symmetric, no curvature, ROM normal, no CVA tenderness  Lungs:     Clear to auscultation bilaterally, respirations unlabored  Chest Wall:    No tenderness or deformity   Heart:    Regular rate and rhythm, S1 and S2 normal, no murmur, rub   or gallop  Breast Exam:    Deferred to GYN  Abdomen:     Soft, non-tender, bowel sounds active all four quadrants,    no masses, no organomegaly  Genitalia:    Deferred to GYN  Rectal:    Extremities:   Extremities normal, atraumatic, no cyanosis or edema    Pulses:   2+ and symmetric all extremities  Skin:   Skin color, texture, turgor normal, no rashes or lesions  Lymph nodes:   Cervical, supraclavicular, and axillary nodes normal  Neurologic:   CNII-XII intact, normal strength, sensation and reflexes    throughout          Assessment & Plan:

## 2017-09-30 ENCOUNTER — Encounter: Payer: Self-pay | Admitting: General Practice

## 2017-10-10 ENCOUNTER — Other Ambulatory Visit: Payer: Self-pay | Admitting: Family Medicine

## 2017-10-12 ENCOUNTER — Other Ambulatory Visit: Payer: Self-pay | Admitting: General Practice

## 2017-10-12 MED ORDER — DEXLANSOPRAZOLE 30 MG PO CPDR: DELAYED_RELEASE_CAPSULE | ORAL | 0 refills | Status: DC

## 2017-10-17 LAB — HM MAMMOGRAPHY

## 2017-10-26 ENCOUNTER — Other Ambulatory Visit: Payer: Self-pay | Admitting: General Practice

## 2017-10-26 MED ORDER — IBUPROFEN 800 MG PO TABS: 800.0000 mg | ORAL_TABLET | Freq: Three times a day (TID) | ORAL | 0 refills | Status: DC | PRN

## 2017-10-26 NOTE — Telephone Encounter (Signed)
Medication filled to pharmacy as requested.   

## 2017-10-26 NOTE — Telephone Encounter (Signed)
Last OV 09/29/17 Ibuprofen last filled 10/10/17 #60 with 1  Pt would like #180.

## 2017-10-27 ENCOUNTER — Other Ambulatory Visit (INDEPENDENT_AMBULATORY_CARE_PROVIDER_SITE_OTHER): Payer: Self-pay | Admitting: Otolaryngology

## 2017-10-27 DIAGNOSIS — J329 Chronic sinusitis, unspecified: Secondary | ICD-10-CM

## 2017-11-01 ENCOUNTER — Ambulatory Visit
Admission: RE | Admit: 2017-11-01 | Discharge: 2017-11-01 | Disposition: A | Discharge status: Home / Self Care | Payer: BC Managed Care – PPO | Source: Ambulatory Visit | Attending: Otolaryngology | Admitting: Otolaryngology

## 2017-11-01 ENCOUNTER — Encounter: Payer: Self-pay | Admitting: General Practice

## 2017-11-01 DIAGNOSIS — J329 Chronic sinusitis, unspecified: Secondary | ICD-10-CM

## 2017-11-01 IMAGING — CT CT MAXILLOFACIAL W/O CM
1 series · 15 of 30 positions shown, 19 images · non-contrast
Comparison: None.

CLINICAL DATA: Chronic sinusitis.  Postnasal drip.

EXAM:
CT MAXILLOFACIAL WITHOUT CONTRAST
TECHNIQUE: Multidetector CT images of the paranasal sinuses were obtained using
the standard protocol without intravenous contrast.

[Series 4: soft tissue · axial · 0.46mm/px · z∈[+853,+1004]mm · 15 of 163 slices shown, 19 images]
[im 6/163  brain]
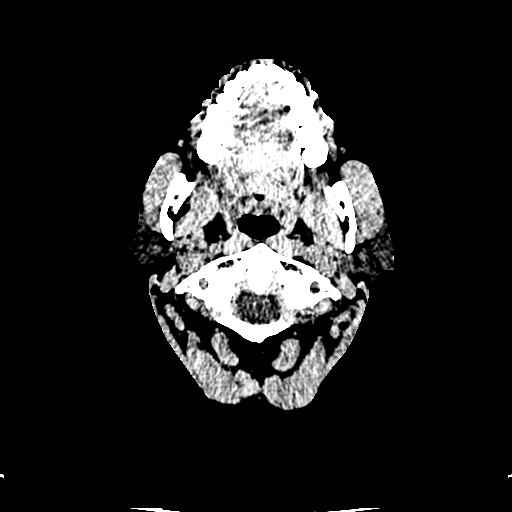
[im 6/163  bone]
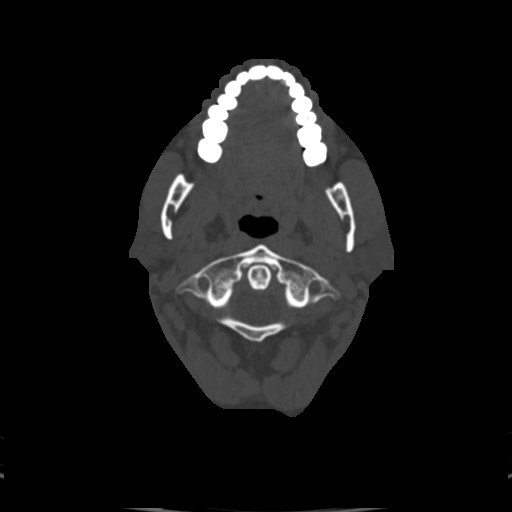
[im 17/163  bone]
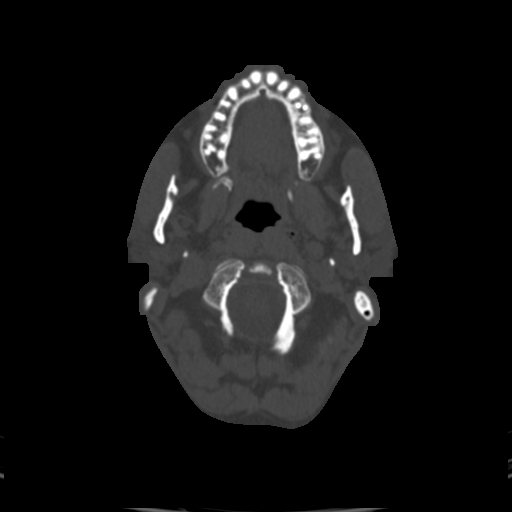
[im 28/163  bone]
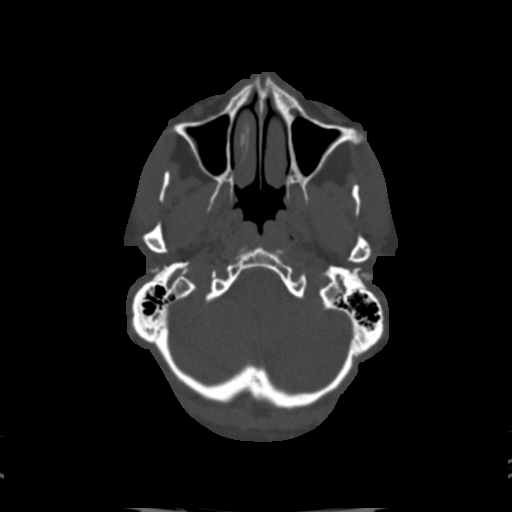
[im 40/163  bone]
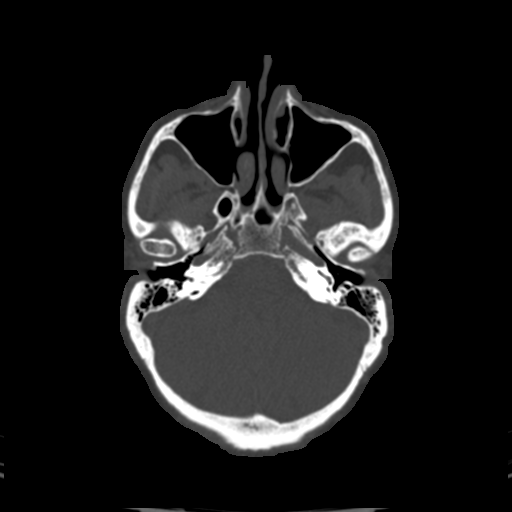
[im 51/163  brain]
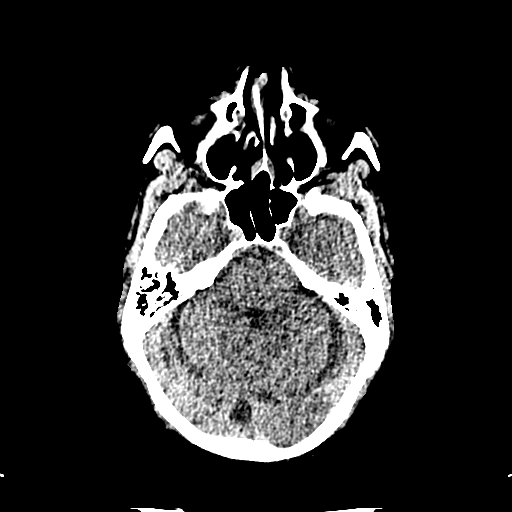
[im 51/163  bone]
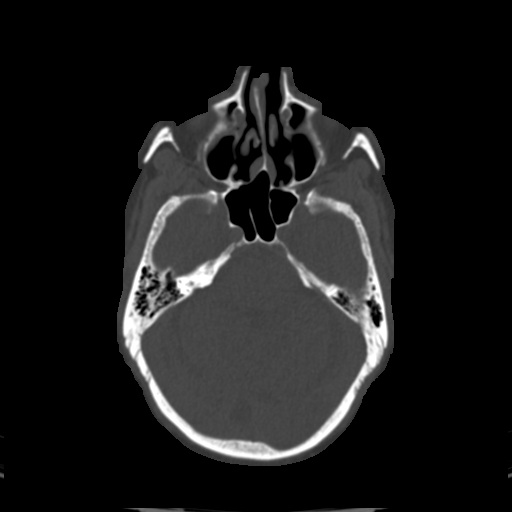
[im 62/163  bone]
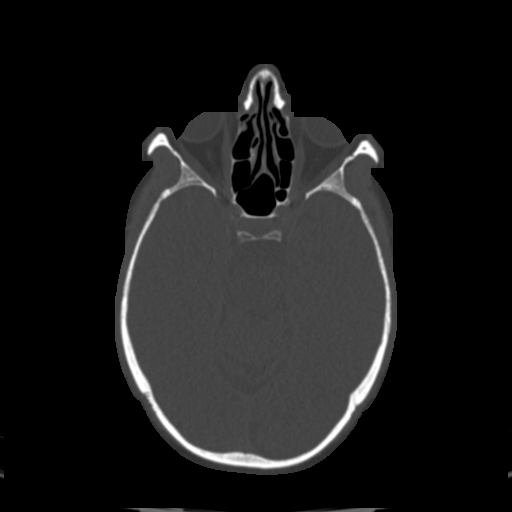
[im 73/163  bone]
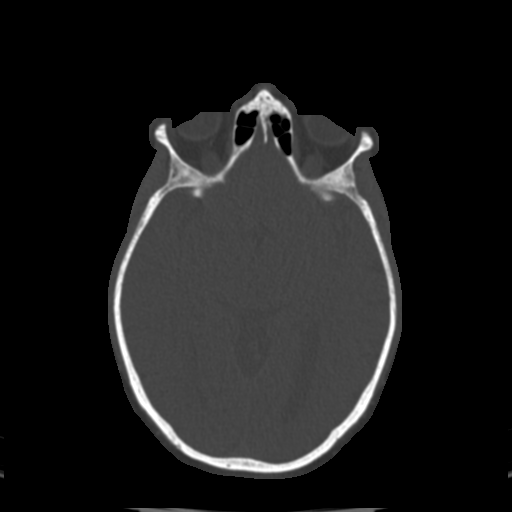
[im 84/163  bone]
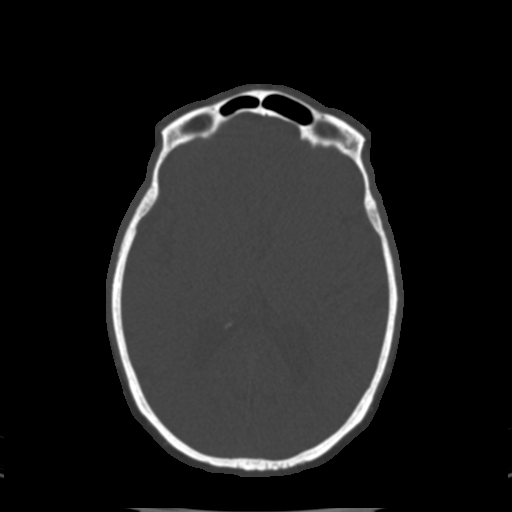
[im 90/163  brain]
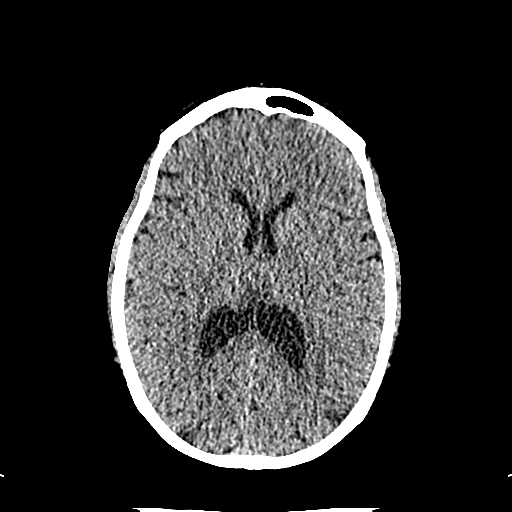
[im 90/163  bone]
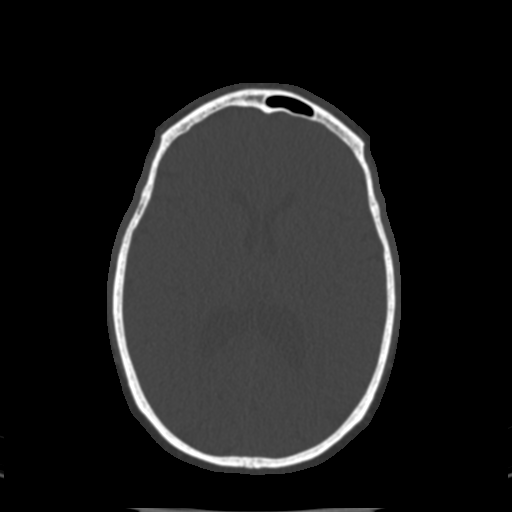
[im 101/163  bone]
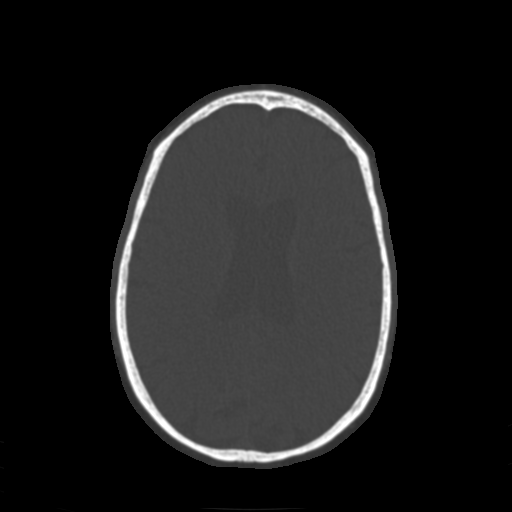
[im 112/163  bone]
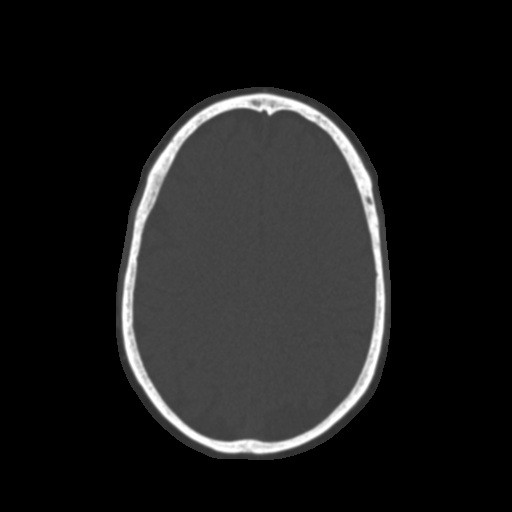
[im 123/163  bone]
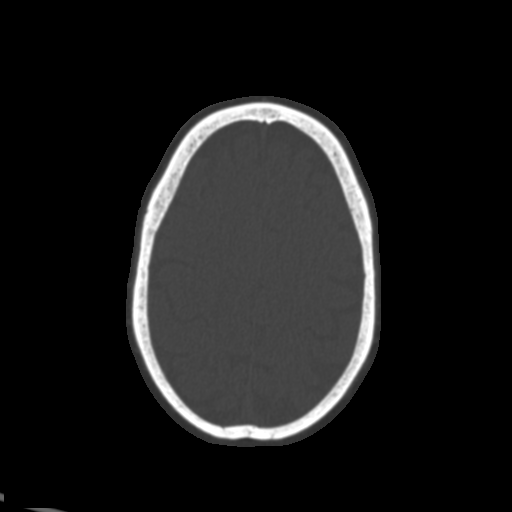
[im 135/163  brain]
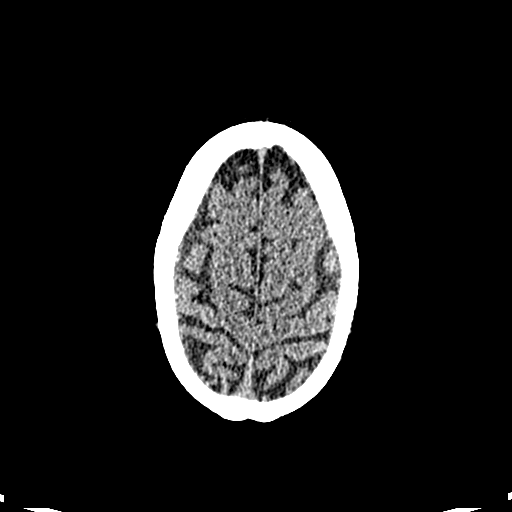
[im 135/163  bone]
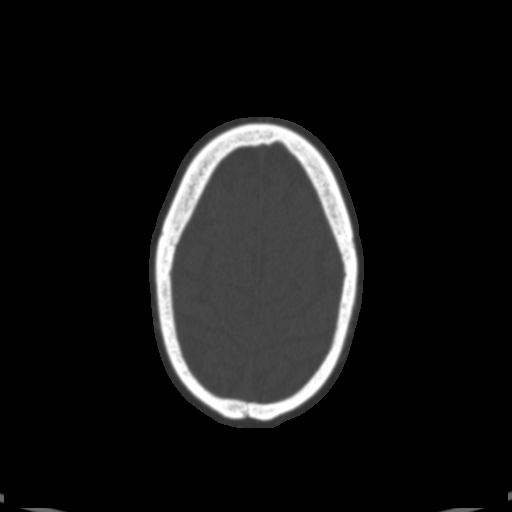
[im 146/163  bone]
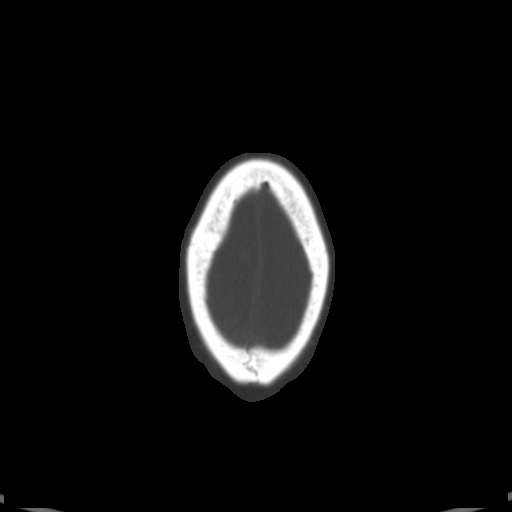
[im 157/163  bone]
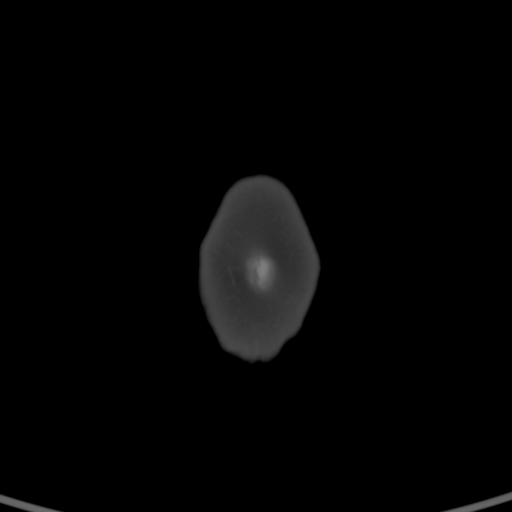

[15 of 30 positions shown; findings below may reference images not displayed]

FINDINGS: Paranasal sinuses:

Frontal: Normally aerated. Patent frontal sinus drainage pathways.

Ethmoid: Bilateral partial ethmoidectomies with patent surgical
openings. Mild generalized mucosal thickening.

Maxillary: Bilateral medial maxillary antrostomy that is widely
patent. Minimal mucosal thickening along the roof and floor on
coronal reformats.

Sphenoid: Normally aerated. Patent sphenoethmoidal recesses.

Right ostiomeatal unit: Antrostomy and partial turbinectomy. Widely
patent surgical openings.

Left ostiomeatal unit: Antrostomy and partial turbinectomy. Widely
patent surgical openings.

Nasal passages: The nasal septum is mildly deviated towards the
right anteriorly and towards the left posteriorly.

Anatomy: No pneumatization superior to anterior ethmoid notches.
Sellar sphenoid pneumatization pattern. No dehiscence of carotid or
optic canals. No onodi cell.

Other: Orbits and intracranial compartment are unremarkable. Visible
mastoid air cells are normally aerated.
IMPRESSION: 1.  Widely patent maxillary antrostomies and ethmoidectomies.
2. Minimal mucosal thickening seen along ethmoid and maxillary
sinuses.

## 2018-01-05 ENCOUNTER — Encounter: Payer: Self-pay | Admitting: Family Medicine

## 2018-01-10 ENCOUNTER — Other Ambulatory Visit: Payer: Self-pay

## 2018-01-10 ENCOUNTER — Encounter: Payer: Self-pay | Admitting: Family Medicine

## 2018-01-10 ENCOUNTER — Ambulatory Visit: Payer: BC Managed Care – PPO | Admitting: Family Medicine

## 2018-01-10 VITALS — BP 120/80 | HR 56 | Resp 16 | Ht 66.0 in | Wt 143.4 lb

## 2018-01-10 DIAGNOSIS — M25511 Pain in right shoulder: Secondary | ICD-10-CM | POA: Diagnosis not present

## 2018-01-10 MED ORDER — MELOXICAM 15 MG PO TABS: 15.0000 mg | ORAL_TABLET | Freq: Every day | ORAL | 1 refills | Status: DC

## 2018-01-10 NOTE — Progress Notes (Signed)
   Subjective:    Patient ID: JOZALYNN NOYCE, female    DOB: Jun 06, 1961, 57 y.o.   MRN: [id number]  HPI Shoulder pain- R shoulder, first noticed 4/16 when she was making a 'winding'/softball pitching motion w/ R arm and had to abruptly stop her motion and felt something 'pop'.  No bruising or swelling.  'very sore'.  Pain occurs w/ motion- particularly above the head or w/ pushing.  Also pain w/ abduction.  No improvement since initial injury.  No relief w/ NSAIDs.  R hand dominant   Review of Systems For ROS see HPI     Objective:   Physical Exam  Constitutional: She appears well-developed and well-nourished. No distress.  Cardiovascular: Intact distal pulses.  Musculoskeletal: She exhibits tenderness (TTP over R biceps and anterior shoulder). She exhibits no deformity.  + impingement signs on R Pain w/ forward flexion at 120 degrees Pain w/ internal and external rotation, abduction  Skin: Skin is warm and dry. No erythema.  Vitals reviewed.         Assessment & Plan:  Shoulder pain- new.  Pt had acute injury on 4/16 during lacrosse game.  Pt is having pain in almost all planes of motion.  Given her high activity level, lack of improvement w/ time and NSAIDs, refer to ortho.  Reviewed supportive care and red flags that should prompt return.  Pt expressed understanding and is in agreement w/ plan.

## 2018-01-10 NOTE — Patient Instructions (Signed)
Follow up as needed or as scheduled We'll call you with your Ortho appt START the Meloxicam once daily- take w/ food Alternate ice/heat for pain relief Call with any questions or concerns Hang in there!!!

## 2018-01-17 ENCOUNTER — Other Ambulatory Visit: Payer: Self-pay | Admitting: Family Medicine

## 2018-01-23 ENCOUNTER — Other Ambulatory Visit: Payer: Self-pay | Admitting: Family Medicine

## 2018-04-10 ENCOUNTER — Encounter: Payer: Self-pay | Admitting: Family Medicine

## 2018-04-10 ENCOUNTER — Other Ambulatory Visit: Payer: Self-pay

## 2018-04-10 ENCOUNTER — Ambulatory Visit: Payer: BC Managed Care – PPO | Admitting: Family Medicine

## 2018-04-10 VITALS — BP 121/81 | HR 70 | Temp 98.1°F | Resp 16 | Ht 66.0 in | Wt 144.4 lb

## 2018-04-10 DIAGNOSIS — B9689 Other specified bacterial agents as the cause of diseases classified elsewhere: Secondary | ICD-10-CM | POA: Diagnosis not present

## 2018-04-10 DIAGNOSIS — J329 Chronic sinusitis, unspecified: Secondary | ICD-10-CM

## 2018-04-10 DIAGNOSIS — H66002 Acute suppurative otitis media without spontaneous rupture of ear drum, left ear: Secondary | ICD-10-CM | POA: Diagnosis not present

## 2018-04-10 MED ORDER — AZITHROMYCIN 250 MG PO TABS: ORAL_TABLET | ORAL | 0 refills | Status: DC

## 2018-04-10 NOTE — Progress Notes (Signed)
   Subjective:    Patient ID: Robin Mueller, female    DOB: 10/02/1960, 57 y.o.   MRN: [id number]  HPI URI- returned from [place] 1 week ago and 'sneezed the whole way home'.  Sxs have progressively worsened.  Hx of recurrent sinus infxns.  Subjective fevers early on.  + HA.  + facial pain but no tooth pain.  No drainage at this time.  Bilateral ear pain.  Mild nausea.  No vomiting.  Mild cough.   Review of Systems For ROS see HPI     Objective:   Physical Exam  Constitutional: She appears well-developed and well-nourished. No distress.  HENT:  Head: Normocephalic and atraumatic.  Right Ear: Tympanic membrane normal.  Left Ear: Tympanic membrane is erythematous and bulging. A middle ear effusion is present.  Nose: Mucosal edema and rhinorrhea present. Right sinus exhibits maxillary sinus tenderness and frontal sinus tenderness. Left sinus exhibits maxillary sinus tenderness and frontal sinus tenderness.  Mouth/Throat: Uvula is midline and mucous membranes are normal. Posterior oropharyngeal erythema present. No oropharyngeal exudate.  Eyes: Pupils are equal, round, and reactive to light. Conjunctivae and EOM are normal.  Neck: Normal range of motion. Neck supple.  Cardiovascular: Normal rate, regular rhythm and normal heart sounds.  Pulmonary/Chest: Effort normal and breath sounds normal. No respiratory distress. She has no wheezes.  Lymphadenopathy:    She has no cervical adenopathy.  Vitals reviewed.         Assessment & Plan:  L OM- new.  Pt has obvious infection of L ear.  Start abx.  Reviewed supportive care and red flags that should prompt return.  Pt expressed understanding and is in agreement w/ plan.   Sinusitis- recurrent.  Pt will be on abx for OM.  Reviewed supportive care and red flags that should prompt return.  Pt expressed understanding and is in agreement w/ plan.

## 2018-04-10 NOTE — Patient Instructions (Signed)
Follow up as needed or as scheduled START the Zpack as directed Drink plenty of fluids REST! Call with any questions or concerns Enjoy the rest of your summer!!

## 2018-04-15 ENCOUNTER — Other Ambulatory Visit: Payer: Self-pay | Admitting: Family Medicine

## 2018-04-18 ENCOUNTER — Other Ambulatory Visit: Payer: Self-pay | Admitting: Family Medicine

## 2018-04-19 ENCOUNTER — Encounter: Payer: Self-pay | Admitting: Family Medicine

## 2018-04-19 MED ORDER — AZITHROMYCIN 250 MG PO TABS: ORAL_TABLET | ORAL | 0 refills | Status: DC

## 2018-04-25 ENCOUNTER — Other Ambulatory Visit: Payer: Self-pay | Admitting: Family Medicine

## 2018-05-22 ENCOUNTER — Ambulatory Visit: Payer: BC Managed Care – PPO | Admitting: Physician Assistant

## 2018-05-22 ENCOUNTER — Encounter: Payer: Self-pay | Admitting: Physician Assistant

## 2018-05-22 ENCOUNTER — Other Ambulatory Visit: Payer: Self-pay

## 2018-05-22 VITALS — BP 110/78 | HR 63 | Temp 98.2°F | Resp 14 | Ht 66.0 in | Wt 145.0 lb

## 2018-05-22 DIAGNOSIS — J0100 Acute maxillary sinusitis, unspecified: Secondary | ICD-10-CM | POA: Diagnosis not present

## 2018-05-22 MED ORDER — SULFAMETHOXAZOLE-TRIMETHOPRIM 800-160 MG PO TABS: 1.0000 | ORAL_TABLET | Freq: Two times a day (BID) | ORAL | 0 refills | Status: DC

## 2018-05-22 NOTE — Patient Instructions (Signed)
Please take antibiotic as directed.  Increase fluid intake.  Use Saline nasal spray.  Take a daily multivitamin. Continue allergy medications.  Place a humidifier in the bedroom.  Please call or return clinic if symptoms are not improving.  Sinusitis Sinusitis is redness, soreness, and swelling (inflammation) of the paranasal sinuses. Paranasal sinuses are air pockets within the bones of your face (beneath the eyes, the middle of the forehead, or above the eyes). In healthy paranasal sinuses, mucus is able to drain out, and air is able to circulate through them by way of your nose. However, when your paranasal sinuses are inflamed, mucus and air can become trapped. This can allow bacteria and other germs to grow and cause infection. Sinusitis can develop quickly and last only a short time (acute) or continue over a long period (chronic). Sinusitis that lasts for more than 12 weeks is considered chronic.  CAUSES  Causes of sinusitis include:  Allergies.  Structural abnormalities, such as displacement of the cartilage that separates your nostrils (deviated septum), which can decrease the air flow through your nose and sinuses and affect sinus drainage.  Functional abnormalities, such as when the small hairs (cilia) that line your sinuses and help remove mucus do not work properly or are not present. SYMPTOMS  Symptoms of acute and chronic sinusitis are the same. The primary symptoms are pain and pressure around the affected sinuses. Other symptoms include:  Upper toothache.  Earache.  Headache.  Bad breath.  Decreased sense of smell and taste.  A cough, which worsens when you are lying flat.  Fatigue.  Fever.  Thick drainage from your nose, which often is green and may contain pus (purulent).  Swelling and warmth over the affected sinuses. DIAGNOSIS  Your caregiver will perform a physical exam. During the exam, your caregiver may:  Look in your nose for signs of abnormal growths  in your nostrils (nasal polyps).  Tap over the affected sinus to check for signs of infection.  View the inside of your sinuses (endoscopy) with a special imaging device with a light attached (endoscope), which is inserted into your sinuses. If your caregiver suspects that you have chronic sinusitis, one or more of the following tests may be recommended:  Allergy tests.  Nasal culture A sample of mucus is taken from your nose and sent to a lab and screened for bacteria.  Nasal cytology A sample of mucus is taken from your nose and examined by your caregiver to determine if your sinusitis is related to an allergy. TREATMENT  Most cases of acute sinusitis are related to a viral infection and will resolve on their own within 10 days. Sometimes medicines are prescribed to help relieve symptoms (pain medicine, decongestants, nasal steroid sprays, or saline sprays).  However, for sinusitis related to a bacterial infection, your caregiver will prescribe antibiotic medicines. These are medicines that will help kill the bacteria causing the infection.  Rarely, sinusitis is caused by a fungal infection. In theses cases, your caregiver will prescribe antifungal medicine. For some cases of chronic sinusitis, surgery is needed. Generally, these are cases in which sinusitis recurs more than 3 times per year, despite other treatments. HOME CARE INSTRUCTIONS   Drink plenty of water. Water helps thin the mucus so your sinuses can drain more easily.  Use a humidifier.  Inhale steam 3 to 4 times a day (for example, sit in the bathroom with the shower running).  Apply a warm, moist washcloth to your face 3 to 4   times a day, or as directed by your caregiver.  Use saline nasal sprays to help moisten and clean your sinuses.  Take over-the-counter or prescription medicines for pain, discomfort, or fever only as directed by your caregiver. SEEK IMMEDIATE MEDICAL CARE IF:  You have increasing pain or severe  headaches.  You have nausea, vomiting, or drowsiness.  You have swelling around your face.  You have vision problems.  You have a stiff neck.  You have difficulty breathing. MAKE SURE YOU:   Understand these instructions.  Will watch your condition.  Will get help right away if you are not doing well or get worse. Document Released: 08/23/2005 Document Revised: 11/15/2011 Document Reviewed: 09/07/2011 [employer] Patient Information 2014 [employer].   

## 2018-05-22 NOTE — Progress Notes (Signed)
Patient presents to clinic today c/o > 1 week of nasal congestion, sinus pressure, ear pressure, fatigue. Endorses over the past 3 days having thicker, colored nasal drainage, sinus pain, sore throat and facial pain. Denies recent travel or sick contact. Is taking Mucinex-D with some mild relief of symptoms. Patient with history significant for allergic rhinitis and sinusitis.   Past Medical History:  Diagnosis Date  . Allergic rhinitis   . Allergy   . Anxiety   . Arthritis    hands, feet,back - otc med prn  . Colon polyp   . Depression   . Dysrhythmia    hx pvc,pac  . GERD (gastroesophageal reflux disease)   . IBS (irritable bowel syndrome)    diet controlled  . Internal hemorrhoid   . PONV (postoperative nausea and vomiting)   . Rosacea     Current Outpatient Medications on File Prior to Visit  Medication Sig Dispense Refill  . Ascorbic Acid (VITAMIN C) 100 MG CHEW Chew by mouth daily.      Marland Kitchen b complex vitamins tablet Take 1 tablet by mouth daily.    Marland Kitchen BIOTIN PO Take by mouth daily.    . Calcium 600-200 MG-UNIT per tablet Take 1 tablet by mouth daily.      Marland Kitchen Dexlansoprazole (DEXILANT) 30 MG capsule TAKE 1 CAPSULE BY MOUTH EVERY DAY 90 capsule 0  . DYMISTA 137-50 MCG/ACT SUSP USE 1 TO 2 SPRAYS IN EACH NOSTRIL ONCE DAILY 23 g 6  . Fexofenadine HCl (MUCINEX ALLERGY PO) Take by mouth as needed.    . fish oil-omega-3 fatty acids 1000 MG capsule Take 1 g by mouth daily.      . Flaxseed, Linseed, (FLAX SEED OIL) 1000 MG CAPS Take by mouth daily.      . hydroquinone 4 % cream Apply topically 2 (two) times daily.  2  . ibuprofen (ADVIL,MOTRIN) 800 MG tablet TAKE 1 TABLET BY MOUTH EVERY 8 HOURS AS NEEDED 180 tablet 0  . meloxicam (MOBIC) 15 MG tablet Take 1 tablet (15 mg total) by mouth daily. 30 tablet 1  . tretinoin (RETIN-A) 0.05 % cream Apply topically at bedtime.     No current facility-administered medications on file prior to visit.     Allergies  Allergen Reactions  .  Amoxicillin     Diarrhea   . Adhesive [Tape] Rash  . Latex Rash    She says she has a mild reaction to this    Family History  Problem Relation Age of Onset  . Coronary artery disease Maternal Grandfather   . Heart disease Maternal Grandfather   . Hypertension Father   . Stroke Father   . Colon polyps Father   . Hypertension Mother   . Diabetes Mother   . Colon polyps Mother        precancerous  . Heart attack Mother   . Diverticulitis Mother   . Hypertension Brother   . Colon polyps Brother   . Diabetes Brother   . Colon cancer Neg Hx   . Stomach cancer Neg Hx   . Esophageal cancer Neg Hx   . Rectal cancer Neg Hx     Social History   Socioeconomic History  . Marital status: Married    Spouse name: Not on file  . Number of children: 1  . Years of education: Not on file  . Highest education level: Not on file  Occupational History  . Occupation: Warehouse manager  Social Needs  .  Financial resource strain: Not on file  . Food insecurity:    Worry: Not on file    Inability: Not on file  . Transportation needs:    Medical: Not on file    Non-medical: Not on file  Tobacco Use  . Smoking status: Former Smoker    Packs/day: 1.00    Types: Cigarettes    Last attempt to quit: 09/06/1998    Years since quitting: 19.7  . Smokeless tobacco: Never Used  Substance and Sexual Activity  . Alcohol use: Yes    Alcohol/week: 14.0 standard drinks    Types: 14 Glasses of wine per week    Comment: 0-2 per day  . Drug use: No  . Sexual activity: Yes    Birth control/protection: Post-menopausal    Comment: Hysterectomy  Lifestyle  . Physical activity:    Days per week: Not on file    Minutes per session: Not on file  . Stress: Not on file  Relationships  . Social connections:    Talks on phone: Not on file    Gets together: Not on file    Attends religious service: Not on file    Active member of club or organization: Not on file    Attends meetings of clubs or  organizations: Not on file    Relationship status: Not on file  Other Topics Concern  . 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  All other ROS are negative.  BP 110/78   Pulse 63   Temp 98.2 F (36.8 C) (Oral)   Resp 14   Ht 5\' 6"  (1.676 m)   Wt 145 lb (65.8 kg)   SpO2 99%   BMI 23.40 kg/m   Physical Exam  Constitutional: She appears well-developed and well-nourished.  HENT:  Head: Normocephalic and atraumatic.  Right Ear: Tympanic membrane normal.  Left Ear: Tympanic membrane normal.  Nose: Mucosal edema and rhinorrhea present. Right sinus exhibits maxillary sinus tenderness. Left sinus exhibits maxillary sinus tenderness.  Mouth/Throat: Uvula is midline and oropharynx is clear and moist.  Eyes: Pupils are equal, round, and reactive to light.  Neck: Neck supple.  Cardiovascular: Normal rate, regular rhythm, normal heart sounds and intact distal pulses.  Pulmonary/Chest: Effort normal and breath sounds normal. No stridor. No respiratory distress. She has no wheezes. She has no rales. She exhibits no tenderness.  Lymphadenopathy:    She has no cervical adenopathy.  Psychiatric: She has a normal mood and affect.  Vitals reviewed.  Assessment/Plan: 1. Acute non-recurrent maxillary sinusitis Rx Bactrim.  Increase fluids.  Rest.  Saline nasal spray.  Probiotic.  Mucinex as directed.  Humidifier in bedroom. Continue Dymista and Fexofenadine.  Call or return to clinic if symptoms are not improving.  - sulfamethoxazole-trimethoprim (BACTRIM DS,SEPTRA DS) 800-160 MG tablet; Take 1 tablet by mouth 2 (two) times daily.  Dispense: 20 tablet; Refill: 0   Piedad ClimesWilliam Cody Moataz Tavis, PA-C

## 2018-05-24 ENCOUNTER — Ambulatory Visit: Payer: BC Managed Care – PPO | Admitting: Family Medicine

## 2018-07-22 ENCOUNTER — Other Ambulatory Visit: Payer: Self-pay | Admitting: Family Medicine

## 2018-08-02 ENCOUNTER — Encounter: Payer: Self-pay | Admitting: Family Medicine

## 2018-08-02 ENCOUNTER — Other Ambulatory Visit: Payer: Self-pay

## 2018-08-02 ENCOUNTER — Ambulatory Visit: Payer: BC Managed Care – PPO | Admitting: Family Medicine

## 2018-08-02 VITALS — BP 112/80 | HR 78 | Temp 98.1°F | Resp 16 | Ht 66.0 in | Wt 143.5 lb

## 2018-08-02 DIAGNOSIS — H66005 Acute suppurative otitis media without spontaneous rupture of ear drum, recurrent, left ear: Secondary | ICD-10-CM

## 2018-08-02 DIAGNOSIS — J329 Chronic sinusitis, unspecified: Secondary | ICD-10-CM | POA: Diagnosis not present

## 2018-08-02 DIAGNOSIS — B9689 Other specified bacterial agents as the cause of diseases classified elsewhere: Secondary | ICD-10-CM

## 2018-08-02 MED ORDER — CLARITHROMYCIN 500 MG PO TABS: 500.0000 mg | ORAL_TABLET | Freq: Two times a day (BID) | ORAL | 0 refills | Status: DC

## 2018-08-02 NOTE — Progress Notes (Signed)
   Subjective:    Patient ID: Francella SolianKathleen A Lubinski, female    DOB: 07-29-1961, 57 y.o.   MRN: [id number]  HPI URI- sxs started last week w/ chest tightness, 'like a cold coming on'.  No fevers.  + sick contacts.  L ear pain.  + laryngitis.  + sinus pressure when lying down.  + PND.  + cough.  No tooth pain.   Review of Systems For ROS see HPI     Objective:   Physical Exam  Constitutional: She appears well-developed and well-nourished. No distress.  HENT:  Head: Normocephalic and atraumatic.  TTP over frontal and maxillary sinuses L TM w/ obvious purulent fluid behind TM R TM WNL + PND  Eyes: Pupils are equal, round, and reactive to light. EOM are normal.  Neck: Normal range of motion.  Cardiovascular: Normal rate, regular rhythm and normal heart sounds.  Pulmonary/Chest: Effort normal and breath sounds normal. No stridor. No respiratory distress. She has no wheezes. She has no rales.  Lymphadenopathy:    She has cervical adenopathy.  Vitals reviewed.         Assessment & Plan:  L OM- new.  Pt's sxs and PE consistent w/ infxn.  Start abx.  Reviewed supportive care and red flags that should prompt return.  Pt expressed understanding and is in agreement w/ plan.   Sinusitis- new.  Pt's Biaxin will cover any acute bacterial sinusitis.  Reviewed supportive care and red flags that should prompt return.  Pt expressed understanding and is in agreement w/ plan.

## 2018-08-02 NOTE — Patient Instructions (Signed)
Follow up as needed or as scheduled START the Clarithromycin twice daily- take w/ food Drink plenty of fluids REST! Call with any questions or concerns Safe [place] and Happy Holidays!

## 2018-08-31 ENCOUNTER — Telehealth: Payer: Self-pay

## 2018-08-31 NOTE — Telephone Encounter (Signed)
LMOVM to return call to schedule CPE earlier than May.    copied from CRM [phone]. Topic:  Appointment Scheduling - Scheduling Inquiry for Clinic >> Aug 31, 2018  1:00 PM Arlyss Gandyichardson, Taren N, NT wrote: Reason for CRM: Pt scheduled for her CPE with Dr. Beverely Lowabori on 01/10/19. Pt would like to see if she can get in before then for her physical. Please advise.

## 2018-09-04 ENCOUNTER — Ambulatory Visit: Payer: BC Managed Care – PPO | Admitting: Podiatry

## 2018-09-04 ENCOUNTER — Encounter: Payer: Self-pay | Admitting: Podiatry

## 2018-09-04 DIAGNOSIS — B351 Tinea unguium: Secondary | ICD-10-CM

## 2018-09-06 NOTE — Progress Notes (Signed)
   Subjective: 58 year old female presenting today for follow up evaluation of a fungal right great toenail. She reports continued symptoms and states the nail is beginning to break off. She has used a topical antifungal nail lacquer with no significant relief of  the symptoms. There are no modifying factors noted. Patient is here for further evaluation and treatment.   Past Medical History:  Diagnosis Date  . Allergic rhinitis   . Allergy   . Anxiety   . Arthritis    hands, feet,back - otc med prn  . Colon polyp   . Depression   . Dysrhythmia    hx pvc,pac  . GERD (gastroesophageal reflux disease)   . IBS (irritable bowel syndrome)    diet controlled  . Internal hemorrhoid   . PONV (postoperative nausea and vomiting)   . Rosacea     Objective: Physical Exam General: The patient is alert and oriented x3 in no acute distress.  Dermatology: Hyperkeratotic, discolored, thickened, onychodystrophy of the right great toenail noted. Skin is warm, dry and supple bilateral lower extremities. Negative for open lesions or macerations.  Vascular: Palpable pedal pulses bilaterally. No edema or erythema noted. Capillary refill within normal limits.  Neurological: Epicritic and protective threshold grossly intact bilaterally.   Musculoskeletal Exam: Range of motion within normal limits to all pedal and ankle joints bilateral. Muscle strength 5/5 in all groups bilateral.   Assessment: #1 onychomycosis right great toenail  Plan of Care:  #1 Patient was evaluated. #2 Appointment with Shanda Bumps, RN for laser treatment.  #3 Return to clinic as needed.    Robin Mueller, DPM [facility]  Dr. Felecia Mueller, DPM    [address]                                        [place], [place] [address]                Office [phone]  Fax [phone]

## 2018-09-18 ENCOUNTER — Ambulatory Visit: Payer: Self-pay

## 2018-09-18 DIAGNOSIS — B351 Tinea unguium: Secondary | ICD-10-CM

## 2018-09-25 NOTE — Progress Notes (Signed)
Pt presents with mycotic infection of nails right hallux only.  All other systems are negative  Laser therapy administered to affected nails and tolerated well. All safety precautions were in place.  Second treatment.  Follow up in 4 weeks

## 2018-10-10 ENCOUNTER — Encounter: Payer: Self-pay | Admitting: Family Medicine

## 2018-10-17 ENCOUNTER — Ambulatory Visit: Payer: Self-pay

## 2018-10-17 DIAGNOSIS — B351 Tinea unguium: Secondary | ICD-10-CM

## 2018-10-18 ENCOUNTER — Other Ambulatory Visit: Payer: Self-pay | Admitting: Family Medicine

## 2018-10-18 NOTE — Progress Notes (Signed)
Pt presents with mycotic infection of nails right hallux only.  All other systems are negative  Laser therapy administered to affected nails and tolerated well. All safety precautions were in place.  Third treatment.  Follow up in 4 weeks

## 2018-10-30 LAB — HM MAMMOGRAPHY

## 2018-11-09 ENCOUNTER — Encounter: Payer: Self-pay | Admitting: Emergency Medicine

## 2018-11-20 ENCOUNTER — Other Ambulatory Visit: Payer: BC Managed Care – PPO

## 2018-11-23 ENCOUNTER — Other Ambulatory Visit: Payer: BC Managed Care – PPO

## 2018-12-05 ENCOUNTER — Encounter: Payer: Self-pay | Admitting: Family Medicine

## 2019-01-01 ENCOUNTER — Other Ambulatory Visit: Payer: Self-pay | Admitting: Family Medicine

## 2019-01-08 ENCOUNTER — Encounter: Payer: Self-pay | Admitting: Family Medicine

## 2019-01-10 ENCOUNTER — Encounter: Payer: BC Managed Care – PPO | Admitting: Family Medicine

## 2019-01-10 ENCOUNTER — Encounter: Payer: Self-pay | Admitting: Family Medicine

## 2019-01-17 ENCOUNTER — Other Ambulatory Visit: Payer: Self-pay | Admitting: Family Medicine

## 2019-02-18 ENCOUNTER — Other Ambulatory Visit: Payer: Self-pay | Admitting: Family Medicine

## 2019-02-26 ENCOUNTER — Telehealth: Payer: Self-pay | Admitting: Family Medicine

## 2019-02-26 NOTE — Telephone Encounter (Signed)
Pt called in stating that she has a bump/cyst on the back of her head. She states it has been there since last Wednesday and it is getting worse and is painful. Can we schedule an in office visit for evaluation?

## 2019-02-26 NOTE — Telephone Encounter (Signed)
Ok if screening is passed.

## 2019-02-26 NOTE — Telephone Encounter (Signed)
Passed screening she is scheduled

## 2019-02-28 ENCOUNTER — Encounter: Payer: Self-pay | Admitting: Family Medicine

## 2019-02-28 ENCOUNTER — Ambulatory Visit: Payer: BC Managed Care – PPO | Admitting: Family Medicine

## 2019-02-28 ENCOUNTER — Other Ambulatory Visit: Payer: Self-pay

## 2019-02-28 VITALS — BP 108/76 | HR 76 | Temp 97.8°F | Resp 17 | Ht 66.0 in | Wt 141.1 lb

## 2019-02-28 DIAGNOSIS — L089 Local infection of the skin and subcutaneous tissue, unspecified: Secondary | ICD-10-CM

## 2019-02-28 MED ORDER — DOXYCYCLINE HYCLATE 100 MG PO TABS: 100.0000 mg | ORAL_TABLET | Freq: Two times a day (BID) | ORAL | 0 refills | Status: DC

## 2019-02-28 NOTE — Patient Instructions (Signed)
Follow up as needed or as scheduled START the Doxycycline twice daily- take w/ food Warm compresses to the area Try and avoid picking and scratching Call with any questions or concerns Stay Safe!

## 2019-02-28 NOTE — Progress Notes (Signed)
   Subjective:    Patient ID: Robin Mueller, female    DOB: Mar 17, 1961, 58 y.o.   MRN: [id number]  HPI Mass- posterior scalp.  Noticed ~1 week ago.  'itches and hurts at the same time'.  No drainage.  Causing some neck stiffness.  No fever.   Review of Systems For ROS see HPI     Objective:   Physical Exam Vitals signs reviewed.  Constitutional:      General: She is not in acute distress.    Appearance: Normal appearance. She is not ill-appearing.  HENT:     Head: Normocephalic and atraumatic.  Lymphadenopathy:     Head:     Right side of head: Occipital adenopathy present.  Skin:    General: Skin is warm and dry.     Findings: Erythema present.     Comments: 2 cm soft tissue swelling, no fluctuance or drainage w/ overlying redness and surrounding LAD  Neurological:     Mental Status: She is alert.  Psychiatric:        Mood and Affect: Mood normal.        Behavior: Behavior normal.        Thought Content: Thought content normal.           Assessment & Plan:  Scalp infxn- new.  Likely an early boil/abscess but no area to drain.  Start abx.  Reviewed supportive care and red flags that should prompt return.  Pt expressed understanding and is in agreement w/ plan.

## 2019-03-15 ENCOUNTER — Encounter: Payer: Self-pay | Admitting: Family Medicine

## 2019-03-27 ENCOUNTER — Other Ambulatory Visit: Payer: Self-pay

## 2019-03-27 ENCOUNTER — Encounter: Payer: Self-pay | Admitting: Physician Assistant

## 2019-03-27 ENCOUNTER — Ambulatory Visit (INDEPENDENT_AMBULATORY_CARE_PROVIDER_SITE_OTHER): Payer: BC Managed Care – PPO | Admitting: Physician Assistant

## 2019-03-27 VITALS — Temp 98.3°F

## 2019-03-27 DIAGNOSIS — J0100 Acute maxillary sinusitis, unspecified: Secondary | ICD-10-CM

## 2019-03-27 MED ORDER — CEFDINIR 300 MG PO CAPS: 300.0000 mg | ORAL_CAPSULE | Freq: Two times a day (BID) | ORAL | 0 refills | Status: DC

## 2019-03-27 NOTE — Patient Instructions (Signed)
Instructions sent to MyChart.    Please take antibiotic as directed.  Increase fluid intake.  Use Saline nasal spray.  Take a daily multivitamin. Continue allergy medications as directed.  Place a humidifier in the bedroom.  Please call or return clinic if symptoms are not improving.  Sinusitis Sinusitis is redness, soreness, and swelling (inflammation) of the paranasal sinuses. Paranasal sinuses are air pockets within the bones of your face (beneath the eyes, the middle of the forehead, or above the eyes). In healthy paranasal sinuses, mucus is able to drain out, and air is able to circulate through them by way of your nose. However, when your paranasal sinuses are inflamed, mucus and air can become trapped. This can allow bacteria and other germs to grow and cause infection. Sinusitis can develop quickly and last only a short time (acute) or continue over a long period (chronic). Sinusitis that lasts for more than 12 weeks is considered chronic.  CAUSES  Causes of sinusitis include:  Allergies.  Structural abnormalities, such as displacement of the cartilage that separates your nostrils (deviated septum), which can decrease the air flow through your nose and sinuses and affect sinus drainage.  Functional abnormalities, such as when the small hairs (cilia) that line your sinuses and help remove mucus do not work properly or are not present. SYMPTOMS  Symptoms of acute and chronic sinusitis are the same. The primary symptoms are pain and pressure around the affected sinuses. Other symptoms include:  Upper toothache.  Earache.  Headache.  Bad breath.  Decreased sense of smell and taste.  A cough, which worsens when you are lying flat.  Fatigue.  Fever.  Thick drainage from your nose, which often is green and may contain pus (purulent).  Swelling and warmth over the affected sinuses. DIAGNOSIS  Your caregiver will perform a physical exam. During the exam, your caregiver may:   Look in your nose for signs of abnormal growths in your nostrils (nasal polyps).  Tap over the affected sinus to check for signs of infection.  View the inside of your sinuses (endoscopy) with a special imaging device with a light attached (endoscope), which is inserted into your sinuses. If your caregiver suspects that you have chronic sinusitis, one or more of the following tests may be recommended:  Allergy tests.  Nasal culture A sample of mucus is taken from your nose and sent to a lab and screened for bacteria.  Nasal cytology A sample of mucus is taken from your nose and examined by your caregiver to determine if your sinusitis is related to an allergy. TREATMENT  Most cases of acute sinusitis are related to a viral infection and will resolve on their own within 10 days. Sometimes medicines are prescribed to help relieve symptoms (pain medicine, decongestants, nasal steroid sprays, or saline sprays).  However, for sinusitis related to a bacterial infection, your caregiver will prescribe antibiotic medicines. These are medicines that will help kill the bacteria causing the infection.  Rarely, sinusitis is caused by a fungal infection. In theses cases, your caregiver will prescribe antifungal medicine. For some cases of chronic sinusitis, surgery is needed. Generally, these are cases in which sinusitis recurs more than 3 times per year, despite other treatments. HOME CARE INSTRUCTIONS   Drink plenty of water. Water helps thin the mucus so your sinuses can drain more easily.  Use a humidifier.  Inhale steam 3 to 4 times a day (for example, sit in the bathroom with the shower running).  Apply a   warm, moist washcloth to your face 3 to 4 times a day, or as directed by your caregiver.  Use saline nasal sprays to help moisten and clean your sinuses.  Take over-the-counter or prescription medicines for pain, discomfort, or fever only as directed by your caregiver. SEEK IMMEDIATE MEDICAL  CARE IF:  You have increasing pain or severe headaches.  You have nausea, vomiting, or drowsiness.  You have swelling around your face.  You have vision problems.  You have a stiff neck.  You have difficulty breathing. MAKE SURE YOU:   Understand these instructions.  Will watch your condition.  Will get help right away if you are not doing well or get worse. Document Released: 08/23/2005 Document Revised: 11/15/2011 Document Reviewed: 09/07/2011 Exit[employer] Patient Information 2014 [employer].   

## 2019-03-27 NOTE — Progress Notes (Signed)
Virtual Visit via Video   I connected with patient on 03/27/19 at 11:30 AM EDT by a video enabled telemedicine application and verified that I am speaking with the correct person using two identifiers.  Location patient: Home [facility] [place] [place] Persons participating in the virtual visit: Patient, Provider, CMA (Patina Moore)  I discussed the limitations of evaluation and management by telemedicine and the availability of in person appointments. The patient expressed understanding and agreed to proceed.  Subjective:   HPI:   Patient presents via Doxy.Me today c/o 2 weeks of intermittent nasal congestion with stuffy nose, nasal congestion, sinus pressure, headache and maxillary sinus/tooth pain. Denies change in taste of smell. Denies fever, chills, aches. Denies chest congestion or cough. Denies chest congestion, chest pain or cough. Is taking her allergy medications (allergra and dymista) daily as directed. Also taking Mucinex-D -- helps some but hard to tell. Denies recent travel. Denies sick contact or exposure to COVID. Has history of sinusitis and this feels similar. Was recently on Doxycyline for scalp infection when symptoms first began.     ROS:   See pertinent positives and negatives per HPI.  Patient Active Problem List   Diagnosis Date Noted  . Right foot injury, initial encounter 07/22/2016  . Latex allergy 05/31/2016  . Hemorrhoids, internal, with bleeding 06/03/2015  . Decreased hearing of both ears 01/20/2015  . RLS (restless legs syndrome) 07/14/2014  . Middle ear effusion 05/30/2014  . Physical exam 10/19/2011  . Plantar fasciitis 10/19/2011  . Hand pain 05/26/2011  . Seasonal and perennial allergic rhinitis 04/09/2009  . ANXIETY DEPRESSION 02/26/2009  . GERD 10/02/2008  . IBS 10/02/2008  . ABDOMINAL PAIN 10/02/2008    Social History   Tobacco Use  . Smoking status: Former Smoker    Packs/day: 1.00    Types: Cigarettes   Quit date: 09/06/1998    Years since quitting: 20.5  . Smokeless tobacco: Never Used  Substance Use Topics  . Alcohol use: Yes    Alcohol/week: 14.0 standard drinks    Types: 14 Glasses of wine per week    Comment: 0-2 per day    Current Outpatient Medications:  .  Ascorbic Acid (VITAMIN C) 100 MG CHEW, Chew by mouth daily.  , Disp: , Rfl:  .  Azelastine-Fluticasone 137-50 MCG/ACT SUSP, USE 1 TO 2 SPRAYS IN EACH NOSTRIL ONCE DAILY, Disp: 48 g, Rfl: 6 .  b complex vitamins tablet, Take 1 tablet by mouth daily., Disp: , Rfl:  .  BIOTIN PO, Take by mouth daily., Disp: , Rfl:  .  Calcium 600-200 MG-UNIT per tablet, Take 1 tablet by mouth daily.  , Disp: , Rfl:  .  Dexlansoprazole (DEXILANT) 30 MG capsule, TAKE 1 CAPSULE BY MOUTH EVERY DAY, Disp: 90 capsule, Rfl: 0 .  Fexofenadine HCl (MUCINEX ALLERGY PO), Take by mouth as needed., Disp: , Rfl:  .  fish oil-omega-3 fatty acids 1000 MG capsule, Take 1 g by mouth daily.  , Disp: , Rfl:  .  Flaxseed, Linseed, (FLAX SEED OIL) 1000 MG CAPS, Take by mouth daily.  , Disp: , Rfl:  .  hydroquinone 4 % cream, Apply topically 2 (two) times daily., Disp: , Rfl: 2 .  ibuprofen (ADVIL) 800 MG tablet, TAKE 1 TABLET BY MOUTH EVERY 8 HOURS AS NEEDED, Disp: 180 tablet, Rfl: 0 .  meloxicam (MOBIC) 15 MG tablet, Take 1 tablet (15 mg total) by mouth daily., Disp: 30 tablet, Rfl: 1 .  tretinoin (RETIN-A)  0.05 % cream, Apply topically at bedtime., Disp: , Rfl:  .  cefdinir (OMNICEF) 300 MG capsule, Take 1 capsule (300 mg total) by mouth 2 (two) times daily., Disp: 20 capsule, Rfl: 0  Allergies  Allergen Reactions  . Amoxicillin     Diarrhea   . Adhesive [Tape] Rash  . Latex Rash    She says she has a mild reaction to this    Objective:   Temp 98.3 F (36.8 C) (Oral)   Patient is well-developed, well-nourished in no acute distress.  Resting comfortably at home.  Head is normocephalic, atraumatic.  No labored breathing.  Speech is clear and coherent  with logical contest.  Patient is alert and oriented at baseline.  + TTP maxillary sinuses with patient aid.  Assessment and Plan:        1. Acute non-recurrent maxillary sinusitis Rx Cefdinir as she was recently on Doxy, is intolerant to penicillings and want to avoid FQs in milder infection.  Increase fluids.  Rest.  Saline nasal spray.  Probiotic.  Mucinex as directed.  Humidifier in bedroom. Continue allergy medications.  Call or return to clinic if symptoms are not improving.  - cefdinir (OMNICEF) 300 MG capsule; Take 1 capsule (300 mg total) by mouth 2 (two) times daily.  Dispense: 20 capsule; Refill: 0 .   Leeanne Rio, PA-C 03/27/2019

## 2019-03-27 NOTE — Progress Notes (Signed)
I have discussed the procedure for the virtual visit with the patient who has given consent to proceed with assessment and treatment.   Khoi Hamberger S Lorraina Spring, CMA     

## 2019-04-13 ENCOUNTER — Other Ambulatory Visit: Payer: Self-pay | Admitting: Family Medicine

## 2019-05-07 ENCOUNTER — Encounter: Payer: Self-pay | Admitting: Family Medicine

## 2019-05-07 ENCOUNTER — Ambulatory Visit (INDEPENDENT_AMBULATORY_CARE_PROVIDER_SITE_OTHER): Payer: BC Managed Care – PPO | Admitting: Family Medicine

## 2019-05-07 ENCOUNTER — Other Ambulatory Visit: Payer: Self-pay

## 2019-05-07 VITALS — BP 106/73 | HR 69 | Temp 98.1°F | Resp 16 | Ht 66.0 in | Wt 144.4 lb

## 2019-05-07 DIAGNOSIS — Z23 Encounter for immunization: Secondary | ICD-10-CM

## 2019-05-07 DIAGNOSIS — Z0000 Encounter for general adult medical examination without abnormal findings: Secondary | ICD-10-CM | POA: Diagnosis not present

## 2019-05-07 DIAGNOSIS — Z1159 Encounter for screening for other viral diseases: Secondary | ICD-10-CM | POA: Diagnosis not present

## 2019-05-07 DIAGNOSIS — Z9189 Other specified personal risk factors, not elsewhere classified: Secondary | ICD-10-CM

## 2019-05-07 DIAGNOSIS — E785 Hyperlipidemia, unspecified: Secondary | ICD-10-CM

## 2019-05-07 LAB — CBC WITH DIFFERENTIAL/PLATELET
Basophils Absolute: 0.1 10*3/uL (ref 0.0–0.1)
Basophils Relative: 1 % (ref 0.0–3.0)
Eosinophils Absolute: 0.2 10*3/uL (ref 0.0–0.7)
Eosinophils Relative: 2.6 % (ref 0.0–5.0)
HCT: 40.5 % (ref 36.0–46.0)
Hemoglobin: 13.4 g/dL (ref 12.0–15.0)
Lymphocytes Relative: 26.6 % (ref 12.0–46.0)
Lymphs Abs: 2.2 10*3/uL (ref 0.7–4.0)
MCHC: 33.1 g/dL (ref 30.0–36.0)
MCV: 100.5 fl — ABNORMAL HIGH (ref 78.0–100.0)
Monocytes Absolute: 0.6 10*3/uL (ref 0.1–1.0)
Monocytes Relative: 7.2 % (ref 3.0–12.0)
Neutro Abs: 5.1 10*3/uL (ref 1.4–7.7)
Neutrophils Relative %: 62.6 % (ref 43.0–77.0)
Platelets: 276 10*3/uL (ref 150.0–400.0)
RBC: 4.03 Mil/uL (ref 3.87–5.11)
RDW: 12.7 % (ref 11.5–15.5)
WBC: 8.1 10*3/uL (ref 4.0–10.5)

## 2019-05-07 LAB — LIPID PANEL
Cholesterol: 264 mg/dL — ABNORMAL HIGH (ref 0–200)
HDL: 77.9 mg/dL (ref >39.00)
NonHDL: 186.34
Total CHOL/HDL Ratio: 3
Triglycerides: 236 mg/dL — ABNORMAL HIGH (ref 0.0–149.0)
VLDL: 47.2 mg/dL — ABNORMAL HIGH (ref 0.0–40.0)

## 2019-05-07 LAB — BASIC METABOLIC PANEL
BUN: 27 mg/dL — ABNORMAL HIGH (ref 6–23)
CO2: 26 mEq/L (ref 19–32)
Calcium: 9.7 mg/dL (ref 8.4–10.5)
Chloride: 103 mEq/L (ref 96–112)
Creatinine, Ser: 0.91 mg/dL (ref 0.40–1.20)
GFR: 63.88 mL/min (ref >60.00)
Glucose, Bld: 86 mg/dL (ref 70–99)
Potassium: 4.4 mEq/L (ref 3.5–5.1)
Sodium: 139 mEq/L (ref 135–145)

## 2019-05-07 LAB — HEPATIC FUNCTION PANEL
ALT: 32 U/L (ref 0–35)
AST: 30 U/L (ref 0–37)
Albumin: 4.8 g/dL (ref 3.5–5.2)
Alkaline Phosphatase: 51 U/L (ref 39–117)
Bilirubin, Direct: 0.1 mg/dL (ref 0.0–0.3)
Total Bilirubin: 0.6 mg/dL (ref 0.2–1.2)
Total Protein: 7 g/dL (ref 6.0–8.3)

## 2019-05-07 LAB — TSH: TSH: 2.02 u[IU]/mL (ref 0.35–4.50)

## 2019-05-07 LAB — LDL CHOLESTEROL, DIRECT: Direct LDL: 156 mg/dL

## 2019-05-07 NOTE — Patient Instructions (Signed)
Follow up in 1 year or as needed We'll notify you of your lab results and make any changes if needed Keep up the good work!  You look great! Call with any questions or concerns Stay Safe!!! 

## 2019-05-07 NOTE — Assessment & Plan Note (Signed)
Pt's PE WNL.  UTD on mammo, colonoscopy.  Flu shot given today.  Check labs.  Anticipatory guidance provided.  

## 2019-05-07 NOTE — Progress Notes (Signed)
   Subjective:    Patient ID: Robin Mueller, female    DOB: Jun 21, 1961, 58 y.o.   MRN: [id number]  HPI CPE- UTD on mammo, colonoscopy.  No need for pap due to hysterectomy.  UTD on Tdap.  Desires flu shot.   Review of Systems Patient reports no vision/ hearing changes, adenopathy,fever, weight change,  persistant/recurrent hoarseness , swallowing issues, chest pain, palpitations, edema, persistant/recurrent cough, hemoptysis, dyspnea (rest/exertional/paroxysmal nocturnal), gastrointestinal bleeding (melena, rectal bleeding), abdominal pain, significant heartburn, bowel changes, GU symptoms (dysuria, hematuria, incontinence), Gyn symptoms (abnormal  bleeding, pain),  syncope, focal weakness, memory loss, numbness & tingling, skin/hair/nail changes, abnormal bruising or bleeding, anxiety, or depression.     Objective:   Physical Exam General Appearance:    Alert, cooperative, no distress, appears stated age  Head:    Normocephalic, without obvious abnormality, atraumatic  Eyes:    PERRL, conjunctiva/corneas clear, EOM's intact, fundi    benign, both eyes  Ears:    Normal TM's and external ear canals, both ears  Nose:   Nares normal, septum midline, mucosa normal, no drainage    or sinus tenderness  Throat:   Lips, mucosa, and tongue normal; teeth and gums normal  Neck:   Supple, symmetrical, trachea midline, no adenopathy;    Thyroid: no enlargement/tenderness/nodules  Back:     Symmetric, no curvature, ROM normal, no CVA tenderness  Lungs:     Clear to auscultation bilaterally, respirations unlabored  Chest Wall:    No tenderness or deformity   Heart:    Regular rate and rhythm, S1 and S2 normal, no murmur, rub   or gallop  Breast Exam:    Deferred to mammo  Abdomen:     Soft, non-tender, bowel sounds active all four quadrants,    no masses, no organomegaly  Genitalia:    Deferred  Rectal:    Extremities:   Extremities normal, atraumatic, no cyanosis or edema  Pulses:   2+ and  symmetric all extremities  Skin:   Skin color, texture, turgor normal, no rashes or lesions  Lymph nodes:   Cervical, supraclavicular, and axillary nodes normal  Neurologic:   CNII-XII intact, normal strength, sensation and reflexes    throughout          Assessment & Plan:

## 2019-05-08 LAB — HEPATITIS C ANTIBODY
Hepatitis C Ab: NONREACTIVE
SIGNAL TO CUT-OFF: 0.01 (ref <1.00)

## 2019-05-09 ENCOUNTER — Encounter: Payer: Self-pay | Admitting: General Practice

## 2019-06-06 ENCOUNTER — Encounter: Payer: Self-pay | Admitting: Family Medicine

## 2019-06-06 ENCOUNTER — Other Ambulatory Visit: Payer: Self-pay

## 2019-06-06 DIAGNOSIS — Z20822 Contact with and (suspected) exposure to covid-19: Secondary | ICD-10-CM

## 2019-06-07 ENCOUNTER — Encounter: Payer: Self-pay | Admitting: Family Medicine

## 2019-06-07 LAB — NOVEL CORONAVIRUS, NAA: SARS-CoV-2, NAA: NOT DETECTED

## 2019-07-04 ENCOUNTER — Other Ambulatory Visit: Payer: Self-pay

## 2019-07-04 DIAGNOSIS — Z20822 Contact with and (suspected) exposure to covid-19: Secondary | ICD-10-CM

## 2019-07-05 LAB — NOVEL CORONAVIRUS, NAA: SARS-CoV-2, NAA: NOT DETECTED

## 2019-08-12 ENCOUNTER — Other Ambulatory Visit: Payer: Self-pay | Admitting: Family Medicine

## 2019-08-22 ENCOUNTER — Encounter: Payer: Self-pay | Admitting: Family Medicine

## 2019-08-22 DIAGNOSIS — M25561 Pain in right knee: Secondary | ICD-10-CM

## 2019-09-19 ENCOUNTER — Encounter: Payer: Self-pay | Admitting: Family Medicine

## 2019-09-20 ENCOUNTER — Encounter: Payer: Self-pay | Admitting: Family Medicine

## 2019-10-03 ENCOUNTER — Encounter: Payer: Self-pay | Admitting: Family Medicine

## 2019-10-03 ENCOUNTER — Ambulatory Visit (INDEPENDENT_AMBULATORY_CARE_PROVIDER_SITE_OTHER): Payer: BC Managed Care – PPO | Admitting: Family Medicine

## 2019-10-03 ENCOUNTER — Other Ambulatory Visit: Payer: Self-pay

## 2019-10-03 DIAGNOSIS — H18821 Corneal disorder due to contact lens, right eye: Secondary | ICD-10-CM

## 2019-10-03 MED ORDER — POLYMYXIN B-TRIMETHOPRIM 10000-0.1 UNIT/ML-% OP SOLN: 2.0000 [drp] | Freq: Three times a day (TID) | OPHTHALMIC | 0 refills | Status: DC

## 2019-10-03 NOTE — Progress Notes (Signed)
Virtual Visit via Video   I connected with patient on 10/03/19 at  3:30 PM EST by a video enabled telemedicine application and verified that I am speaking with the correct person using two identifiers.  Location patient: Home Location provider: [facility], Office Persons participating in the virtual visit: Patient, Provider, Robin Seneca (Jess B)  I discussed the limitations of evaluation and management by telemedicine and the availability of in person appointments. The patient expressed understanding and agreed to proceed.  Subjective:   HPI:   Eye issue- yesterday was wearing contacts and 'there was a cloud'.  Cleaned contacts but cloud persisted.  Took contacts out and cloud persisted.  Woke this AM w/ 'drippy, crusting' eye.  'pain is excruciating'.  Eye was very red- somewhat better as day went on today.  Had feeling that something was stuck under contact.  + photophobia.  Compresses to eye improved pain.  Vision has improved, clouding has resolved.    ROS:   See pertinent positives and negatives per HPI.  Patient Active Problem List   Diagnosis Date Noted  . Right foot injury, initial encounter 07/22/2016  . Latex allergy 05/31/2016  . Hemorrhoids, internal, with bleeding 06/03/2015  . Decreased hearing of both ears 01/20/2015  . RLS (restless legs syndrome) 07/14/2014  . Physical exam 10/19/2011  . Plantar fasciitis 10/19/2011  . Hand pain 05/26/2011  . Seasonal and perennial allergic rhinitis 04/09/2009  . ANXIETY DEPRESSION 02/26/2009  . GERD 10/02/2008  . IBS 10/02/2008  . ABDOMINAL PAIN 10/02/2008    Social History   Tobacco Use  . Smoking status: Former Smoker    Packs/day: 1.00    Types: Cigarettes    Quit date: 09/06/1998    Years since quitting: 21.0  . Smokeless tobacco: Never Used  Substance Use Topics  . Alcohol use: Yes    Alcohol/week: 14.0 standard drinks    Types: 14 Glasses of wine per week    Comment: 0-2 per day    Current Outpatient  Medications:  .  Ascorbic Acid (VITAMIN C) 100 MG CHEW, Chew by mouth daily.  , Disp: , Rfl:  .  Azelastine-Fluticasone 137-50 MCG/ACT SUSP, USE 1 TO 2 SPRAYS IN EACH NOSTRIL ONCE DAILY, Disp: 48 g, Rfl: 6 .  b complex vitamins tablet, Take 1 tablet by mouth daily., Disp: , Rfl:  .  BIOTIN PO, Take by mouth daily., Disp: , Rfl:  .  Calcium 600-200 MG-UNIT per tablet, Take 1 tablet by mouth daily.  , Disp: , Rfl:  .  DEXILANT 30 MG capsule, TAKE 1 CAPSULE BY MOUTH EVERY DAY, Disp: 90 capsule, Rfl: 0 .  Fexofenadine HCl (MUCINEX ALLERGY PO), Take by mouth as needed., Disp: , Rfl:  .  fish oil-omega-3 fatty acids 1000 MG capsule, Take 1 g by mouth daily.  , Disp: , Rfl:  .  Flaxseed, Linseed, (FLAX SEED OIL) 1000 MG CAPS, Take by mouth daily.  , Disp: , Rfl:  .  ibuprofen (ADVIL) 800 MG tablet, TAKE 1 TABLET BY MOUTH EVERY 8 HOURS AS NEEDED, Disp: 180 tablet, Rfl: 0  Allergies  Allergen Reactions  . Amoxicillin     Diarrhea   . Adhesive [Tape] Rash  . Latex Rash    She says she has a mild reaction to this    Objective:   There were no vitals taken for this visit. AAOx3, NAD NCAT R eye injected, swollen No obvious CN deficits Coloring WNL Pt is able to speak clearly, coherently  without shortness of breath or increased work of breathing.  Thought process is linear.  Mood is appropriate.   Assessment and Plan:   Suspected Corneal Abrasion- new.  Sounds like something got under pt's contact yesterday causing irritation.  Given the pain, drainage, and redness will start abx eye drops.  Reviewed supportive care and red flags that should prompt return.  Pt expressed understanding and is in agreement w/ plan.   Robin Rhymes, MD 10/03/2019

## 2019-10-03 NOTE — Progress Notes (Signed)
I have discussed the procedure for the virtual visit with the patient who has given consent to proceed with assessment and treatment.   Pt unable to obtain vitals.   Bristol Soy L Donn Wilmot, CMA     

## 2019-10-04 ENCOUNTER — Ambulatory Visit: Payer: BC Managed Care – PPO | Admitting: Family Medicine

## 2019-11-03 ENCOUNTER — Ambulatory Visit: Payer: BC Managed Care – PPO | Attending: Internal Medicine

## 2019-11-03 DIAGNOSIS — Z23 Encounter for immunization: Secondary | ICD-10-CM | POA: Insufficient documentation

## 2019-11-03 NOTE — Progress Notes (Signed)
   Covid-19 Vaccination Clinic  Name:  TENZIN PAVON    MRN: [id number] DOB: [birth year]  11/03/2019  Ms. Kobler was observed post Covid-19 immunization for 15 minutes without incidence. She was provided with Vaccine Information Sheet and instruction to access the V-Safe system.   Ms. Lallier was instructed to call 911 with any severe reactions post vaccine: Marland Kitchen Difficulty breathing  . Swelling of your face and throat  . A fast heartbeat  . A bad rash all over your body  . Dizziness and weakness    Immunizations Administered    Name Date Dose VIS Date Route   Pfizer COVID-19 Vaccine 11/03/2019  5:23 PM 0.3 mL 08/17/2019 Intramuscular   Manufacturer: [employer], [employer]   Lot: [id number]   NDC: [address]-8

## 2019-11-05 LAB — HM MAMMOGRAPHY

## 2019-11-10 ENCOUNTER — Other Ambulatory Visit: Payer: Self-pay | Admitting: Family Medicine

## 2019-11-16 ENCOUNTER — Encounter: Payer: Self-pay | Admitting: General Practice

## 2019-11-19 ENCOUNTER — Encounter: Payer: Self-pay | Admitting: Family Medicine

## 2019-11-19 MED ORDER — PANTOPRAZOLE SODIUM 40 MG PO TBEC: 40.0000 mg | DELAYED_RELEASE_TABLET | Freq: Every day | ORAL | 3 refills | Status: DC

## 2019-11-24 ENCOUNTER — Ambulatory Visit: Payer: BC Managed Care – PPO | Attending: Internal Medicine

## 2019-11-24 DIAGNOSIS — Z23 Encounter for immunization: Secondary | ICD-10-CM

## 2019-11-24 NOTE — Progress Notes (Signed)
   Covid-19 Vaccination Clinic  Name:  NYELLA ECKELS    MRN: [id number] DOB: July 15, 1961  11/24/2019  Ms. Fugett was observed post Covid-19 immunization for 15 minutes without incident. She was provided with Vaccine Information Sheet and instruction to access the V-Safe system.   Ms. Durrett was instructed to call 911 with any severe reactions post vaccine: Marland Kitchen Difficulty breathing  . Swelling of face and throat  . A fast heartbeat  . A bad rash all over body  . Dizziness and weakness   Immunizations Administered    Name Date Dose VIS Date Route   Pfizer COVID-19 Vaccine 11/24/2019  9:09 AM 0.3 mL 08/17/2019 Intramuscular   Manufacturer: [employer], [employer]   Lot: [id number]   NDC: [address]-3

## 2020-01-22 ENCOUNTER — Encounter: Payer: Self-pay | Admitting: Family Medicine

## 2020-02-08 ENCOUNTER — Encounter: Payer: Self-pay | Admitting: Family Medicine

## 2020-02-12 ENCOUNTER — Other Ambulatory Visit: Payer: Self-pay | Admitting: Family Medicine

## 2020-02-12 NOTE — Telephone Encounter (Signed)
Please advise? See note from pharmacy.

## 2020-02-14 ENCOUNTER — Encounter: Payer: Self-pay | Admitting: Family Medicine

## 2020-02-21 ENCOUNTER — Telehealth: Payer: Self-pay | Admitting: Family Medicine

## 2020-02-21 NOTE — Telephone Encounter (Signed)
Patient scheduled for Monday 02/25/2020 at 2:15 pm.

## 2020-02-21 NOTE — Telephone Encounter (Signed)
Please advise last CPE 05/06/19, no upcoming CPE. Ok for nurse visit before or should she wait until she has her CPE?

## 2020-02-21 NOTE — Telephone Encounter (Signed)
Pt will need to check w/ her insurance to see if it is covered.  If it is, I am happy to do a nurse visit or she can wait to CPE if she wants

## 2020-02-21 NOTE — Telephone Encounter (Signed)
Patient would like to get a shingles shot - She has [employer] [facility] [employer] [facility] [employer] - Please advise

## 2020-02-21 NOTE — Telephone Encounter (Signed)
Ok to schedule pt?

## 2020-02-25 ENCOUNTER — Other Ambulatory Visit: Payer: Self-pay

## 2020-02-25 ENCOUNTER — Ambulatory Visit (INDEPENDENT_AMBULATORY_CARE_PROVIDER_SITE_OTHER): Payer: BC Managed Care – PPO

## 2020-02-25 DIAGNOSIS — Z23 Encounter for immunization: Secondary | ICD-10-CM | POA: Diagnosis not present

## 2020-02-25 NOTE — Addendum Note (Signed)
Addended by: Sheliah Hatch on: 02/25/2020 02:35 PM   Modules accepted: Level of Service

## 2020-02-25 NOTE — Progress Notes (Addendum)
59 y.o. female presents to the office today for a [facility] vaccine. Vaccine administered IM in the left deltoid. Patient tolerated well and left the office in good condition. Patient advised to report any abnormal side effects immediately.  The above order is mine.  [name], MD

## 2020-02-27 ENCOUNTER — Other Ambulatory Visit: Payer: Self-pay | Admitting: Family Medicine

## 2020-03-03 ENCOUNTER — Encounter: Payer: Self-pay | Admitting: Physician Assistant

## 2020-03-03 ENCOUNTER — Other Ambulatory Visit: Payer: Self-pay

## 2020-03-03 ENCOUNTER — Telehealth (INDEPENDENT_AMBULATORY_CARE_PROVIDER_SITE_OTHER): Payer: BC Managed Care – PPO | Admitting: Physician Assistant

## 2020-03-03 VITALS — Temp 97.4°F | Ht 66.0 in | Wt 140.0 lb

## 2020-03-03 DIAGNOSIS — J0100 Acute maxillary sinusitis, unspecified: Secondary | ICD-10-CM

## 2020-03-03 MED ORDER — DOXYCYCLINE HYCLATE 100 MG PO TABS
100.0000 mg | ORAL_TABLET | Freq: Two times a day (BID) | ORAL | 0 refills | Status: DC
Start: 2020-03-03 — End: 2020-03-28

## 2020-03-03 NOTE — Progress Notes (Signed)
Virtual Visit via Video   I connected with Robin Mueller on 03/03/20 at 10:00 AM EDT by a video enabled telemedicine application and verified that I am speaking with the correct person using two identifiers. Location patient: Home Location provider: [facility], Office Persons participating in the virtual visit: Robin Mueller, Jarold Motto PA-C, Corky Mull, LPN   I discussed the limitations of evaluation and management by telemedicine and the availability of in person appointments. The patient expressed understanding and agreed to proceed.  I acted as a Neurosurgeon for [employer], [employer], LPN   Subjective:   HPI:   Sinus problem Pt c/o nasal congestion with green drainage, headache, sinus pressure. Started 2 weeks ago. Pt has been using Mucinex without significant relief of symptoms. Has not been around anyone with similar symptoms. Gets this type of sinusitis yearly.   Denies: fevers, chills, weakness, malaise  ROS: See pertinent positives and negatives per HPI.  Patient Active Problem List   Diagnosis Date Noted  . Right foot injury, initial encounter 07/22/2016  . Latex allergy 05/31/2016  . Hemorrhoids, internal, with bleeding 06/03/2015  . Decreased hearing of both ears 01/20/2015  . RLS (restless legs syndrome) 07/14/2014  . Physical exam 10/19/2011  . Plantar fasciitis 10/19/2011  . Hand pain 05/26/2011  . Seasonal and perennial allergic rhinitis 04/09/2009  . ANXIETY DEPRESSION 02/26/2009  . GERD 10/02/2008  . IBS 10/02/2008  . ABDOMINAL PAIN 10/02/2008    Social History   Tobacco Use  . Smoking status: Former Smoker    Packs/day: 1.00    Types: Cigarettes    Quit date: 09/06/1998    Years since quitting: 21.5  . Smokeless tobacco: Never Used  Substance Use Topics  . Alcohol use: Yes    Alcohol/week: 14.0 standard drinks    Types: 14 Glasses of wine per week    Comment: 0-2 per day    Current Outpatient Medications:    .  Ascorbic Acid (VITAMIN C) 100 MG CHEW, Chew by mouth daily.  , Disp: , Rfl:  .  Azelastine-Fluticasone 137-50 MCG/ACT SUSP, USE 1 TO 2 SPRAYS IN EACH NOSTRIL ONCE DAILY, Disp: 23 g, Rfl: 6 .  b complex vitamins tablet, Take 1 tablet by mouth daily., Disp: , Rfl:  .  BIOTIN PO, Take by mouth daily., Disp: , Rfl:  .  Calcium 600-200 MG-UNIT per tablet, Take 1 tablet by mouth daily.  , Disp: , Rfl:  .  Dexlansoprazole (DEXILANT) 30 MG capsule, Take 1 capsule (30 mg total) by mouth daily., Disp: 90 capsule, Rfl: 1 .  Fexofenadine HCl (MUCINEX ALLERGY PO), Take by mouth as needed., Disp: , Rfl:  .  fish oil-omega-3 fatty acids 1000 MG capsule, Take 1 g by mouth daily.  , Disp: , Rfl:  .  Flaxseed, Linseed, (FLAX SEED OIL) 1000 MG CAPS, Take by mouth daily.  , Disp: , Rfl:  .  ibuprofen (ADVIL) 800 MG tablet, TAKE 1 TABLET BY MOUTH EVERY 8 HOURS AS NEEDED, Disp: 180 tablet, Rfl: 0 .  Polyethyl Glycol-Propyl Glycol (SYSTANE) 0.4-0.3 % SOLN, Apply 1 drop to eye in the morning and at bedtime., Disp: , Rfl:  .  doxycycline (VIBRA-TABS) 100 MG tablet, Take 1 tablet (100 mg total) by mouth 2 (two) times daily., Disp: 20 tablet, Rfl: 0  Allergies  Allergen Reactions  . Amoxicillin     Diarrhea   . Adhesive [Tape] Rash  . Latex Rash    She says  she has a mild reaction to this    Objective:   VITALS: Per patient if applicable, see vitals. GENERAL: Alert, appears well and in no acute distress. HEENT: Atraumatic, conjunctiva clear, no obvious abnormalities on inspection of external nose and ears. NECK: Normal movements of the head and neck. CARDIOPULMONARY: No increased WOB. Speaking in clear sentences. I:E ratio WNL.  MS: Moves all visible extremities without noticeable abnormality. PSYCH: Pleasant and cooperative, well-groomed. Speech normal rate and rhythm. Affect is appropriate. Insight and judgement are appropriate. Attention is focused, linear, and appropriate.  NEURO: CN grossly intact.  Oriented as arrived to appointment on time with no prompting. Moves both UE equally.  SKIN: No obvious lesions, wounds, erythema, or cyanosis noted on face or hands.  Assessment and Plan:   Robin Mueller was seen today for sinus problem.  Diagnoses and all orders for this visit:  Acute non-recurrent maxillary sinusitis No red flags on discussion, suspect sinusitis.  Will initiate oral doxycyline per orders. Discussed taking medications as prescribed. Reviewed return precautions including worsening fever, SOB, worsening cough or other concerns. Push fluids and rest. I recommend that patient follow-up if symptoms worsen or persist despite treatment x 7-10 days, sooner if needed.  Other orders -     doxycycline (VIBRA-TABS) 100 MG tablet; Take 1 tablet (100 mg total) by mouth 2 (two) times daily.    . Reviewed expectations re: course of current medical issues. . Discussed self-management of symptoms. . Outlined signs and symptoms indicating need for more acute intervention. . Patient verbalized understanding and all questions were answered. Marland Kitchen Health Maintenance issues including appropriate healthy diet, exercise, and smoking avoidance were discussed with patient. . See orders for this visit as documented in the electronic medical record.  I discussed the assessment and treatment plan with the patient. The patient was provided an opportunity to ask questions and all were answered. The patient agreed with the plan and demonstrated an understanding of the instructions.   The patient was advised to call back or seek an in-person evaluation if the symptoms worsen or if the condition fails to improve as anticipated.   CMA or LPN served as scribe during this visit. History, Physical, and Plan performed by medical provider. The above documentation has been reviewed and is accurate and complete.  [place], [place] 03/03/2020

## 2020-03-27 ENCOUNTER — Encounter: Payer: Self-pay | Admitting: Family Medicine

## 2020-03-28 ENCOUNTER — Encounter: Payer: Self-pay | Admitting: Family Medicine

## 2020-03-28 ENCOUNTER — Telehealth (INDEPENDENT_AMBULATORY_CARE_PROVIDER_SITE_OTHER): Payer: BC Managed Care – PPO | Admitting: Family Medicine

## 2020-03-28 ENCOUNTER — Other Ambulatory Visit: Payer: Self-pay

## 2020-03-28 DIAGNOSIS — J329 Chronic sinusitis, unspecified: Secondary | ICD-10-CM | POA: Diagnosis not present

## 2020-03-28 DIAGNOSIS — B9689 Other specified bacterial agents as the cause of diseases classified elsewhere: Secondary | ICD-10-CM | POA: Diagnosis not present

## 2020-03-28 MED ORDER — SULFAMETHOXAZOLE-TRIMETHOPRIM 800-160 MG PO TABS: 1.0000 | ORAL_TABLET | Freq: Two times a day (BID) | ORAL | 0 refills | Status: DC

## 2020-03-28 NOTE — Progress Notes (Signed)
Virtual Visit via Video   I connected with patient on 03/28/20 at  1:00 PM EDT by a video enabled telemedicine application and verified that I am speaking with the correct person using two identifiers.  Location patient: Home Location provider: [facility], Office Persons participating in the virtual visit: Patient, Provider, CMA (Jess B)  I discussed the limitations of evaluation and management by telemedicine and the availability of in person appointments. The patient expressed understanding and agreed to proceed.  Subjective:   HPI:   Sinusitis- pt was seen on 6/28 and tx'd w/ Doxy for sinus infxn.  Current sxs started 6 days ago.  Took Mucinex and Sudafed w/ minimal relief.  No fever.  + HA.  + maxillary pain.  No tooth pain.  Bilateral ear fullness, L>R.  + nasal congestion, PND.  Taking allergy medication daily.  Pt reports Doxy cleared 1st infxn but then sxs quickly returned.    ROS:   See pertinent positives and negatives per HPI.  Patient Active Problem List   Diagnosis Date Noted  . Right foot injury, initial encounter 07/22/2016  . Latex allergy 05/31/2016  . Hemorrhoids, internal, with bleeding 06/03/2015  . Decreased hearing of both ears 01/20/2015  . RLS (restless legs syndrome) 07/14/2014  . Physical exam 10/19/2011  . Plantar fasciitis 10/19/2011  . Hand pain 05/26/2011  . Seasonal and perennial allergic rhinitis 04/09/2009  . ANXIETY DEPRESSION 02/26/2009  . GERD 10/02/2008  . IBS 10/02/2008  . ABDOMINAL PAIN 10/02/2008    Social History   Tobacco Use  . Smoking status: Former Smoker    Packs/day: 1.00    Types: Cigarettes    Quit date: 09/06/1998    Years since quitting: 21.5  . Smokeless tobacco: Never Used  Substance Use Topics  . Alcohol use: Yes    Alcohol/week: 14.0 standard drinks    Types: 14 Glasses of wine per week    Comment: 0-2 per day    Current Outpatient Medications:  .  Ascorbic Acid (VITAMIN C) 100 MG CHEW, Chew by  mouth daily.  , Disp: , Rfl:  .  Azelastine-Fluticasone 137-50 MCG/ACT SUSP, USE 1 TO 2 SPRAYS IN EACH NOSTRIL ONCE DAILY, Disp: 23 g, Rfl: 6 .  b complex vitamins tablet, Take 1 tablet by mouth daily., Disp: , Rfl:  .  BIOTIN PO, Take by mouth daily., Disp: , Rfl:  .  Calcium 600-200 MG-UNIT per tablet, Take 1 tablet by mouth daily.  , Disp: , Rfl:  .  Dexlansoprazole (DEXILANT) 30 MG capsule, Take 1 capsule (30 mg total) by mouth daily., Disp: 90 capsule, Rfl: 1 .  doxycycline (VIBRA-TABS) 100 MG tablet, Take 1 tablet (100 mg total) by mouth 2 (two) times daily., Disp: 20 tablet, Rfl: 0 .  Fexofenadine HCl (MUCINEX ALLERGY PO), Take by mouth as needed., Disp: , Rfl:  .  fish oil-omega-3 fatty acids 1000 MG capsule, Take 1 g by mouth daily.  , Disp: , Rfl:  .  Flaxseed, Linseed, (FLAX SEED OIL) 1000 MG CAPS, Take by mouth daily.  , Disp: , Rfl:  .  ibuprofen (ADVIL) 800 MG tablet, TAKE 1 TABLET BY MOUTH EVERY 8 HOURS AS NEEDED, Disp: 180 tablet, Rfl: 0 .  Polyethyl Glycol-Propyl Glycol (SYSTANE) 0.4-0.3 % SOLN, Apply 1 drop to eye in the morning and at bedtime., Disp: , Rfl:   Allergies  Allergen Reactions  . Amoxicillin     Diarrhea   . Adhesive [Tape] Rash  . Latex  Rash    She says she has a mild reaction to this    Objective:   There were no vitals taken for this visit.  AAOx3, NAD NCAT, EOMI No obvious CN deficits Coloring WNL Audible nasal congestion and vocal hoarseness Pt is able to speak clearly, coherently without shortness of breath or increased work of breathing.  Thought process is linear.  Mood is appropriate.   Assessment and Plan:   Bacterial sinusitis- recurrent problem for pt.  She recently took Doxy w/ resolution of sxs but they quickly returned.  Will switch to Bactrim DS BID.  Reviewed supportive care and red flags that should prompt return.  Pt expressed understanding and is in agreement w/ plan.    [name], MD 03/28/2020

## 2020-03-28 NOTE — Progress Notes (Signed)
I have discussed the procedure for the virtual visit with the patient who has given consent to proceed with assessment and treatment.   Pt unable to obtain vitals. COVID test was negative yesterday.   Geannie Risen, CMA

## 2020-05-09 ENCOUNTER — Encounter: Payer: Self-pay | Admitting: Family Medicine

## 2020-05-16 ENCOUNTER — Other Ambulatory Visit: Payer: Self-pay

## 2020-05-16 ENCOUNTER — Ambulatory Visit (INDEPENDENT_AMBULATORY_CARE_PROVIDER_SITE_OTHER): Payer: BC Managed Care – PPO

## 2020-05-16 DIAGNOSIS — Z23 Encounter for immunization: Secondary | ICD-10-CM | POA: Diagnosis not present

## 2020-05-16 NOTE — Addendum Note (Signed)
Addended by: Sheliah Hatch on: 05/16/2020 10:14 AM   Modules accepted: Level of Service

## 2020-05-16 NOTE — Progress Notes (Addendum)
Robin Mueller 59 y.o. female presents to office today for second shingles vaccine per [name], MD. Administered SHINGRIX 0.5 mL IM left arm. Patient tolerated well.   The above order is mine.  [name], MD

## 2020-05-23 ENCOUNTER — Encounter: Payer: Self-pay | Admitting: Physician Assistant

## 2020-05-23 ENCOUNTER — Telehealth (INDEPENDENT_AMBULATORY_CARE_PROVIDER_SITE_OTHER): Payer: BC Managed Care – PPO | Admitting: Physician Assistant

## 2020-05-23 ENCOUNTER — Other Ambulatory Visit: Payer: Self-pay

## 2020-05-23 DIAGNOSIS — J0100 Acute maxillary sinusitis, unspecified: Secondary | ICD-10-CM

## 2020-05-23 NOTE — Progress Notes (Signed)
Virtual Visit via Video   I connected with patient on 05/23/20 at  2:30 PM EDT by a video enabled telemedicine application and verified that I am speaking with the correct person using two identifiers.  Location patient: Home Location provider: [facility], Office Persons participating in the virtual visit: Patient, Provider, CMA (Patina Moore)  I discussed the limitations of evaluation and management by telemedicine and the availability of in person appointments. The patient expressed understanding and agreed to proceed.  Subjective:   HPI:   Patient presents via [facility] today c/o 1.5 weeks of headache, sinus pressure, ear pressure/fullness bilaterally with thick green nasal discharge. Notes nasal drainage is foul-smelling. Notes significant maxillary sinus pain. Denies loss of taste or smell. Had low-grade fever and worsening of symptoms this past Saturday (100.0 F). Denies recent travel or sick contact. Has had COVID vaccines. Feels this is identical to prior sinus infections, of which she has a large history. Endorses taking Sudafed for symptomatic relief. Has continued her daily Dymista and antihistamine.  ROS:   See pertinent positives and negatives per HPI.  Patient Active Problem List   Diagnosis Date Noted  . Right foot injury, initial encounter 07/22/2016  . Latex allergy 05/31/2016  . Hemorrhoids, internal, with bleeding 06/03/2015  . Decreased hearing of both ears 01/20/2015  . RLS (restless legs syndrome) 07/14/2014  . Physical exam 10/19/2011  . Plantar fasciitis 10/19/2011  . Hand pain 05/26/2011  . Seasonal and perennial allergic rhinitis 04/09/2009  . ANXIETY DEPRESSION 02/26/2009  . GERD 10/02/2008  . IBS 10/02/2008  . ABDOMINAL PAIN 10/02/2008    Social History   Tobacco Use  . Smoking status: Former Smoker    Packs/day: 1.00    Types: Cigarettes    Quit date: 09/06/1998    Years since quitting: 21.7  . Smokeless tobacco: Never Used    Substance Use Topics  . Alcohol use: Yes    Alcohol/week: 14.0 standard drinks    Types: 14 Glasses of wine per week    Comment: 0-2 per day    Current Outpatient Medications:  .  Ascorbic Acid (VITAMIN C) 100 MG CHEW, Chew by mouth daily.  , Disp: , Rfl:  .  Azelastine-Fluticasone 137-50 MCG/ACT SUSP, USE 1 TO 2 SPRAYS IN EACH NOSTRIL ONCE DAILY, Disp: 23 g, Rfl: 6 .  b complex vitamins tablet, Take 1 tablet by mouth daily., Disp: , Rfl:  .  BIOTIN PO, Take by mouth daily., Disp: , Rfl:  .  Calcium 600-200 MG-UNIT per tablet, Take 1 tablet by mouth daily.  , Disp: , Rfl:  .  Dexlansoprazole (DEXILANT) 30 MG capsule, Take 1 capsule (30 mg total) by mouth daily., Disp: 90 capsule, Rfl: 1 .  Fexofenadine HCl (MUCINEX ALLERGY PO), Take by mouth as needed., Disp: , Rfl:  .  fish oil-omega-3 fatty acids 1000 MG capsule, Take 1 g by mouth daily.  , Disp: , Rfl:  .  Flaxseed, Linseed, (FLAX SEED OIL) 1000 MG CAPS, Take by mouth daily.  , Disp: , Rfl:  .  ibuprofen (ADVIL) 800 MG tablet, TAKE 1 TABLET BY MOUTH EVERY 8 HOURS AS NEEDED, Disp: 180 tablet, Rfl: 0 .  Polyethyl Glycol-Propyl Glycol (SYSTANE) 0.4-0.3 % SOLN, Apply 1 drop to eye in the morning and at bedtime., Disp: , Rfl:   Allergies  Allergen Reactions  . Amoxicillin     Diarrhea   . Adhesive [Tape] Rash  . Latex Rash    She says she has  a mild reaction to this    Objective:   There were no vitals taken for this visit.  Patient is well-developed, well-nourished in no acute distress.  Resting comfortably at home.  Head is normocephalic, atraumatic.  No labored breathing.  Speech is clear and coherent with logical content.  Patient is alert and oriented at baseline.  + TTP sinuses.  Assessment and Plan:   1. Acute non-recurrent maxillary sinusitis Rx Bactrim DS BID x 10 days.  Increase fluids.  Rest.  Saline nasal spray.  Probiotic.  Mucinex as directed.  Humidifier in bedroom. Continue daily antihistamines and nasal  steroids.  Call or return to clinic if symptoms are not improving.      Piedad Climes, PA-C 05/23/2020

## 2020-05-23 NOTE — Patient Instructions (Signed)
Instructions sent to MyChart

## 2020-05-23 NOTE — Progress Notes (Signed)
I have discussed the procedure for the virtual visit with the patient who has given consent to proceed with assessment and treatment.   Antar Milks S Teona Vargus, CMA     

## 2020-05-26 MED ORDER — SULFAMETHOXAZOLE-TRIMETHOPRIM 800-160 MG PO TABS: 1.0000 | ORAL_TABLET | Freq: Two times a day (BID) | ORAL | 0 refills | Status: DC

## 2020-06-24 ENCOUNTER — Encounter: Payer: Self-pay | Admitting: Physician Assistant

## 2020-06-24 ENCOUNTER — Other Ambulatory Visit: Payer: Self-pay

## 2020-06-24 ENCOUNTER — Telehealth (INDEPENDENT_AMBULATORY_CARE_PROVIDER_SITE_OTHER): Payer: BC Managed Care – PPO | Admitting: Physician Assistant

## 2020-06-24 DIAGNOSIS — R0989 Other specified symptoms and signs involving the circulatory and respiratory systems: Secondary | ICD-10-CM | POA: Diagnosis not present

## 2020-06-24 MED ORDER — PREDNISONE 20 MG PO TABS
40.0000 mg | ORAL_TABLET | Freq: Every day | ORAL | 0 refills | Status: DC
Start: 2020-06-24 — End: 2020-09-15

## 2020-06-24 NOTE — Progress Notes (Signed)
I have discussed the procedure for the virtual visit with the patient who has given consent to proceed with assessment and treatment.   Cythnia Osmun S Joleigh Mineau, CMA     

## 2020-06-24 NOTE — Progress Notes (Signed)
Virtual Visit via Video   I connected with patient on 06/24/20 at  4:00 PM EDT by a video enabled telemedicine application and verified that I am speaking with the correct person using two identifiers.  Location patient: Home Location provider: [facility] Persons participating in the virtual visit: Patient, Provider, CMA (Patina Moore)  I discussed the limitations of evaluation and management by telemedicine and the availability of in person appointments. The patient expressed understanding and agreed to proceed.  Subjective:   HPI:   Patient presents via [facility] today c/o ongoing sinus pressure, sinus pain and bilateral ear pressure and pain over the past 1.5 months. Was treated with Bactrim, completing entire course of medication. Notes sinus symptoms resolved but ears have remained full and with pressure and pain. Denies chest congestion, cough, sore throat or sinus pain. Denies fever, chills, aches, nausea or vomiting. Continues to use Dymista and Claritin daily. Is trying to keep well-hydrated.   ROS:   See pertinent positives and negatives per HPI.  Patient Active Problem List   Diagnosis Date Noted  . Right foot injury, initial encounter 07/22/2016  . Latex allergy 05/31/2016  . Hemorrhoids, internal, with bleeding 06/03/2015  . Decreased hearing of both ears 01/20/2015  . RLS (restless legs syndrome) 07/14/2014  . Physical exam 10/19/2011  . Plantar fasciitis 10/19/2011  . Hand pain 05/26/2011  . Seasonal and perennial allergic rhinitis 04/09/2009  . ANXIETY DEPRESSION 02/26/2009  . GERD 10/02/2008  . IBS 10/02/2008  . ABDOMINAL PAIN 10/02/2008    Social History   Tobacco Use  . Smoking status: Former Smoker    Packs/day: 1.00    Types: Cigarettes    Quit date: 09/06/1998    Years since quitting: 21.8  . Smokeless tobacco: Never Used  Substance Use Topics  . Alcohol use: Yes    Alcohol/week: 14.0 standard drinks    Types: 14 Glasses of  wine per week    Comment: 0-2 per day    Current Outpatient Medications:  .  Ascorbic Acid (VITAMIN C) 100 MG CHEW, Chew by mouth daily.  , Disp: , Rfl:  .  Azelastine-Fluticasone 137-50 MCG/ACT SUSP, USE 1 TO 2 SPRAYS IN EACH NOSTRIL ONCE DAILY, Disp: 23 g, Rfl: 6 .  b complex vitamins tablet, Take 1 tablet by mouth daily., Disp: , Rfl:  .  BIOTIN PO, Take by mouth daily., Disp: , Rfl:  .  Calcium 600-200 MG-UNIT per tablet, Take 1 tablet by mouth daily.  , Disp: , Rfl:  .  Dexlansoprazole (DEXILANT) 30 MG capsule, Take 1 capsule (30 mg total) by mouth daily., Disp: 90 capsule, Rfl: 1 .  Fexofenadine HCl (MUCINEX ALLERGY PO), Take by mouth as needed., Disp: , Rfl:  .  fish oil-omega-3 fatty acids 1000 MG capsule, Take 1 g by mouth daily.  , Disp: , Rfl:  .  Flaxseed, Linseed, (FLAX SEED OIL) 1000 MG CAPS, Take by mouth daily.  , Disp: , Rfl:  .  ibuprofen (ADVIL) 800 MG tablet, TAKE 1 TABLET BY MOUTH EVERY 8 HOURS AS NEEDED, Disp: 180 tablet, Rfl: 0 .  Polyethyl Glycol-Propyl Glycol (SYSTANE) 0.4-0.3 % SOLN, Apply 1 drop to eye in the morning and at bedtime., Disp: , Rfl:   Allergies  Allergen Reactions  . Amoxicillin     Diarrhea   . Adhesive [Tape] Rash  . Latex Rash    She says she has a mild reaction to this    Objective:   There were  no vitals taken for this visit.  Patient is well-developed, well-nourished in no acute distress.  Resting comfortably at home.  Head is normocephalic, atraumatic.  No labored breathing.  Speech is clear and coherent with logical content.  Patient is alert and oriented at baseline.   Assessment and Plan:   1. Chronic sinus complaints Residual inflammation likely secondary to chronic allergies. Continue Claritin and Dymista. Start saline nasal rinse. Rx Prednisone 40 mg burst x 5 days to help calm things down. If not resolving would recommend ENT assessment of imaging of sinuses.     Piedad Climes, PA-C 06/24/2020

## 2020-07-07 ENCOUNTER — Encounter: Payer: BC Managed Care – PPO | Admitting: Family Medicine

## 2020-07-16 ENCOUNTER — Encounter: Payer: Self-pay | Admitting: Family Medicine

## 2020-07-16 ENCOUNTER — Other Ambulatory Visit: Payer: Self-pay

## 2020-07-16 ENCOUNTER — Ambulatory Visit (INDEPENDENT_AMBULATORY_CARE_PROVIDER_SITE_OTHER): Payer: BC Managed Care – PPO | Admitting: Family Medicine

## 2020-07-16 VITALS — BP 132/74 | HR 74 | Temp 97.6°F | Resp 16 | Ht 66.0 in | Wt 144.0 lb

## 2020-07-16 DIAGNOSIS — E785 Hyperlipidemia, unspecified: Secondary | ICD-10-CM

## 2020-07-16 DIAGNOSIS — Z0000 Encounter for general adult medical examination without abnormal findings: Secondary | ICD-10-CM

## 2020-07-16 DIAGNOSIS — R319 Hematuria, unspecified: Secondary | ICD-10-CM

## 2020-07-16 LAB — CBC WITH DIFFERENTIAL/PLATELET
Basophils Absolute: 0 10*3/uL (ref 0.0–0.1)
Basophils Relative: 0.9 % (ref 0.0–3.0)
Eosinophils Absolute: 0.3 10*3/uL (ref 0.0–0.7)
Eosinophils Relative: 6.3 % — ABNORMAL HIGH (ref 0.0–5.0)
HCT: 40.4 % (ref 36.0–46.0)
Hemoglobin: 13.9 g/dL (ref 12.0–15.0)
Lymphocytes Relative: 28.2 % (ref 12.0–46.0)
Lymphs Abs: 1.3 10*3/uL (ref 0.7–4.0)
MCHC: 34.4 g/dL (ref 30.0–36.0)
MCV: 98.9 fl (ref 78.0–100.0)
Monocytes Absolute: 0.5 10*3/uL (ref 0.1–1.0)
Monocytes Relative: 11.6 % (ref 3.0–12.0)
Neutro Abs: 2.5 10*3/uL (ref 1.4–7.7)
Neutrophils Relative %: 53 % (ref 43.0–77.0)
Platelets: 248 10*3/uL (ref 150.0–400.0)
RBC: 4.09 Mil/uL (ref 3.87–5.11)
RDW: 12.9 % (ref 11.5–15.5)
WBC: 4.6 10*3/uL (ref 4.0–10.5)

## 2020-07-16 LAB — LIPID PANEL
Cholesterol: 262 mg/dL — ABNORMAL HIGH (ref 0–200)
HDL: 68.4 mg/dL (ref >39.00)
LDL Cholesterol: 163 mg/dL — ABNORMAL HIGH (ref 0–99)
NonHDL: 193.97
Total CHOL/HDL Ratio: 4
Triglycerides: 155 mg/dL — ABNORMAL HIGH (ref 0.0–149.0)
VLDL: 31 mg/dL (ref 0.0–40.0)

## 2020-07-16 LAB — BASIC METABOLIC PANEL
BUN: 18 mg/dL (ref 6–23)
CO2: 29 mEq/L (ref 19–32)
Calcium: 9.4 mg/dL (ref 8.4–10.5)
Chloride: 103 mEq/L (ref 96–112)
Creatinine, Ser: 0.86 mg/dL (ref 0.40–1.20)
GFR: 74.96 mL/min (ref >60.00)
Glucose, Bld: 77 mg/dL (ref 70–99)
Potassium: 4.2 mEq/L (ref 3.5–5.1)
Sodium: 141 mEq/L (ref 135–145)

## 2020-07-16 LAB — POCT URINALYSIS DIPSTICK OB
Bilirubin, UA: NEGATIVE
Glucose, UA: NEGATIVE
Ketones, UA: NEGATIVE
Leukocytes, UA: NEGATIVE
Nitrite, UA: NEGATIVE
POC,PROTEIN,UA: NEGATIVE
Spec Grav, UA: 1.025 (ref 1.010–1.025)
Urobilinogen, UA: 0.2 E.U./dL
pH, UA: 6 (ref 5.0–8.0)

## 2020-07-16 LAB — HEPATIC FUNCTION PANEL
ALT: 23 U/L (ref 0–35)
AST: 22 U/L (ref 0–37)
Albumin: 4.6 g/dL (ref 3.5–5.2)
Alkaline Phosphatase: 49 U/L (ref 39–117)
Bilirubin, Direct: 0.1 mg/dL (ref 0.0–0.3)
Total Bilirubin: 0.6 mg/dL (ref 0.2–1.2)
Total Protein: 6.7 g/dL (ref 6.0–8.3)

## 2020-07-16 LAB — TSH: TSH: 2.81 u[IU]/mL (ref 0.35–4.50)

## 2020-07-16 MED ORDER — CEPHALEXIN 500 MG PO CAPS: 500.0000 mg | ORAL_CAPSULE | Freq: Two times a day (BID) | ORAL | 0 refills | Status: AC

## 2020-07-16 NOTE — Progress Notes (Signed)
   Subjective:    Patient ID: Robin Mueller, female    DOB: 1961-03-04, 59 y.o.   MRN: [id number]  HPI CPE- UTD on mammo, colonoscopy, COVID, Tdap, flu.  No need for pap due to hysterectomy  Reviewed past medical, surgical, family and social histories.   Health Maintenance  Topic Date Due  . HIV Screening  Never done  . MAMMOGRAM  11/04/2020  . TETANUS/TDAP  10/23/2022  . COLONOSCOPY  08/03/2025  . INFLUENZA VACCINE  Completed  . COVID-19 Vaccine  Completed  . Hepatitis C Screening  Completed      Review of Systems Patient reports no vision/ hearing changes, adenopathy,fever, weight change,  persistant/recurrent hoarseness , swallowing issues, chest pain, palpitations, edema, persistant/recurrent cough, hemoptysis, dyspnea (rest/exertional/paroxysmal nocturnal), gastrointestinal bleeding (melena, rectal bleeding), abdominal pain, significant heartburn, bowel changes, Gyn symptoms (abnormal  bleeding, pain),  syncope, focal weakness, memory loss, numbness & tingling, skin/hair/nail changes, abnormal bruising or bleeding, anxiety, or depression.   + bladder pressure- sensation of frequency, urgency, but then unable to urinate.  sxs started ~5 weeks ago and would only occur when she was ref'ing and running.    This visit occurred during the SARS-CoV-2 public health emergency.  Safety protocols were in place, including screening questions prior to the visit, additional usage of staff PPE, and extensive cleaning of exam room while observing appropriate contact time as indicated for disinfecting solutions.       Objective:   Physical Exam General Appearance:    Alert, cooperative, no distress, appears stated age  Head:    Normocephalic, without obvious abnormality, atraumatic  Eyes:    PERRL, conjunctiva/corneas clear, EOM's intact, fundi    benign, both eyes  Ears:    Normal TM's and external ear canals bilaterally w/ visible clear fluid behind each TM  Nose:   Deferred due to  COVID  Throat:   Neck:   Supple, symmetrical, trachea midline, no adenopathy;    Thyroid: no enlargement/tenderness/nodules  Back:     Symmetric, no curvature, ROM normal, no CVA tenderness  Lungs:     Clear to auscultation bilaterally, respirations unlabored  Chest Wall:    No tenderness or deformity   Heart:    Regular rate and rhythm, S1 and S2 normal, no murmur, rub   or gallop  Breast Exam:    Deferred to mammo  Abdomen:     Soft, non-tender, bowel sounds active all four quadrants,    no masses, no organomegaly  Genitalia:    Deferred  Rectal:    Extremities:   Extremities normal, atraumatic, no cyanosis or edema  Pulses:   2+ and symmetric all extremities  Skin:   Skin color, texture, turgor normal, no rashes or lesions  Lymph nodes:   Cervical, supraclavicular, and axillary nodes normal  Neurologic:   CNII-XII intact, normal strength, sensation and reflexes    throughout          Assessment & Plan:

## 2020-07-16 NOTE — Patient Instructions (Signed)
Follow up in 1 year or as needed We'll notify you of your lab results and make any changes if needed Remember to allow yourself to feel!  There is no right or wrong emotion Call with any questions or concerns Hang in there!!!

## 2020-07-16 NOTE — Assessment & Plan Note (Signed)
Pt's PE WNL.  UTD on mammo, colonoscopy, immunizations.  Check labs.  Anticipatory guidance provided.  

## 2020-07-17 LAB — URINE CULTURE
MICRO NUMBER:: 11185998
Result:: NO GROWTH
SPECIMEN QUALITY:: ADEQUATE

## 2020-07-21 ENCOUNTER — Encounter: Payer: Self-pay | Admitting: Family Medicine

## 2020-08-10 ENCOUNTER — Other Ambulatory Visit: Payer: Self-pay | Admitting: Family Medicine

## 2020-09-15 ENCOUNTER — Other Ambulatory Visit: Payer: Self-pay | Admitting: Family

## 2020-09-15 ENCOUNTER — Telehealth (INDEPENDENT_AMBULATORY_CARE_PROVIDER_SITE_OTHER): Payer: BC Managed Care – PPO | Admitting: Family

## 2020-09-15 DIAGNOSIS — H109 Unspecified conjunctivitis: Secondary | ICD-10-CM

## 2020-09-15 MED ORDER — TOBRAMYCIN 0.3 % OP SOLN: 1.0000 [drp] | Freq: Four times a day (QID) | OPHTHALMIC | 0 refills | Status: DC

## 2020-09-15 NOTE — Progress Notes (Signed)
Robin Mueller is a 60 y.o. female with the following history as recorded in [employer]:  Patient Active Problem List   Diagnosis Date Noted  . Latex allergy 05/31/2016  . Hemorrhoids, internal, with bleeding 06/03/2015  . Decreased hearing of both ears 01/20/2015  . RLS (restless legs syndrome) 07/14/2014  . Physical exam 10/19/2011  . Plantar fasciitis 10/19/2011  . Hand pain 05/26/2011  . Seasonal and perennial allergic rhinitis 04/09/2009  . ANXIETY DEPRESSION 02/26/2009  . GERD 10/02/2008  . IBS 10/02/2008    Current Outpatient Medications  Medication Sig Dispense Refill  . Ascorbic Acid (VITAMIN C) 100 MG CHEW Chew by mouth daily.    . Azelastine-Fluticasone 137-50 MCG/ACT SUSP USE 1 TO 2 SPRAYS IN EACH NOSTRIL ONCE DAILY 23 g 6  . b complex vitamins tablet Take 1 tablet by mouth daily.    Marland Kitchen BIOTIN PO Take by mouth daily.    . Calcium 600-200 MG-UNIT per tablet Take 1 tablet by mouth daily.    Marland Kitchen DEXILANT 30 MG capsule TAKE 1 CAPSULE BY MOUTH EVERY DAY 90 capsule 1  . Fexofenadine HCl (MUCINEX ALLERGY PO) Take by mouth as needed.    . fish oil-omega-3 fatty acids 1000 MG capsule Take 1 g by mouth daily.    . Flaxseed, Linseed, (FLAX SEED OIL) 1000 MG CAPS Take by mouth daily.    Marland Kitchen ibuprofen (ADVIL) 800 MG tablet TAKE 1 TABLET BY MOUTH EVERY 8 HOURS AS NEEDED 180 tablet 0  . tobramycin (TOBREX) 0.3 % ophthalmic solution Place 1 drop into both eyes every 6 (six) hours. 10 mL 0  . Polyethyl Glycol-Propyl Glycol (SYSTANE) 0.4-0.3 % SOLN Apply 1 drop to eye in the morning and at bedtime.     No current facility-administered medications for this visit.    Allergies: Amoxicillin, Adhesive [tape], and Latex  Past Medical History:  Diagnosis Date  . Allergic rhinitis   . Allergy   . Anxiety   . Arthritis    hands, feet,back - otc med prn  . Colon polyp   . Depression   . Dysrhythmia    hx pvc,pac  . GERD (gastroesophageal reflux disease)   . IBS (irritable bowel  syndrome)    diet controlled  . Internal hemorrhoid   . PONV (postoperative nausea and vomiting)   . Rosacea     Past Surgical History:  Procedure Laterality Date  . APPENDECTOMY    . CARPAL TUNNEL RELEASE Bilateral    bilateral  . CHOLECYSTECTOMY    . COLONOSCOPY  2007   Gessner normal but hx polyps   . CYST EXCISION Left    left hand and tendon release  . FOOT NEUROMA SURGERY Right 2010  . FOOT SURGERY Left    Debriement  . HEMORRHOID BANDING  05/03/2016  . MASS EXCISION Right 12/05/2014   Procedure: EXCISION MASS, index finger;  Surgeon: Betha Loa, MD;  Location: [facility];  Service: Orthopedics;  Laterality: Right;  . NASAL SEPTUM SURGERY    . right calcaneous surgery    . TUMOR EXCISION Left    left lower leg skin  . ulna nerve release Right 2007  . ulna nerve release Left 2007  . ULNAR NERVE TRANSPOSITION Right 2009  . VAGINAL HYSTERECTOMY  2000    Family History  Problem Relation Age of Onset  . Coronary artery disease Maternal Grandfather   . Heart disease Maternal Grandfather   . Hypertension Father   . Stroke Father   .  Colon polyps Father   . Hypertension Mother   . Diabetes Mother   . Colon polyps Mother        precancerous  . Heart attack Mother   . Diverticulitis Mother   . Cancer Mother        breast  . Hypertension Brother   . Colon polyps Brother   . Diabetes Brother   . Colon cancer Neg Hx   . Stomach cancer Neg Hx   . Esophageal cancer Neg Hx   . Rectal cancer Neg Hx     Social History   Tobacco Use  . Smoking status: Former Smoker    Packs/day: 1.00    Types: Cigarettes    Quit date: 09/06/1998    Years since quitting: 22.0  . Smokeless tobacco: Never Used  Substance Use Topics  . Alcohol use: Yes    Alcohol/week: 14.0 standard drinks    Types: 14 Glasses of wine per week    Comment: 0-2 per day    Subjective:   I connected with Francella Solian on 09/15/20 at  8:40 AM EST  By a telepone call and verified  that I am speaking with the correct person using two identifiers. Patient was unable to get her MyChart to work at time of OV so visit had to be converted to telephone call.    I discussed the limitations of evaluation and management by telemedicine and the availability of in person appointments. The patient expressed understanding and agreed to proceed. Provider in office/ patient is at home; provider and patient are only 2 people on telephone call.   Patient is concerned for "pink eye." Notes that she woke up this morning and left eye was matted shut/ draining; no vision changes or pain- eye just feels "scratchy." No other respiratory symptoms; does wear contact lenses;   Objective:  There were no vitals filed for this visit.  Lungs: Respirations unlabored;  Neurologic: Alert and oriented; speech intact;   Assessment:  1. Conjunctivitis of both eyes, unspecified conjunctivitis type     Plan:  Rx for Tobramycin Opht soluntion- use as directed; one bottle given for each eye; work note for today; will stay out of contacts for next 3-5 days and start with new pair later this week; she will follow up with her PCP if symptoms persist, worsen.  Time spent 10 minutes  No follow-ups on file.  No orders of the defined types were placed in this encounter.   Requested Prescriptions   Signed Prescriptions Disp Refills  . tobramycin (TOBREX) 0.3 % ophthalmic solution 10 mL 0    Sig: Place 1 drop into both eyes every 6 (six) hours.

## 2020-10-14 ENCOUNTER — Ambulatory Visit (INDEPENDENT_AMBULATORY_CARE_PROVIDER_SITE_OTHER): Payer: BC Managed Care – PPO | Admitting: Family Medicine

## 2020-10-14 ENCOUNTER — Other Ambulatory Visit: Payer: Self-pay

## 2020-10-14 ENCOUNTER — Encounter: Payer: Self-pay | Admitting: Family Medicine

## 2020-10-14 VITALS — BP 122/70 | HR 56 | Temp 97.3°F | Resp 16 | Ht 66.0 in | Wt 149.0 lb

## 2020-10-14 DIAGNOSIS — R519 Headache, unspecified: Secondary | ICD-10-CM | POA: Diagnosis not present

## 2020-10-14 NOTE — Patient Instructions (Signed)
Follow up by MyChart in 2 weeks to let me know how the headaches are doing Make sure you are staying well hydrated At the first sign of headache- take 2 tylenol (Acetaminophen) Continue your allergy medication daily Call with any questions or concerns Hang in there!

## 2020-10-14 NOTE — Progress Notes (Signed)
   Subjective:    Patient ID: Robin Mueller, female    DOB: 06/22/1961, 60 y.o.   MRN: [id number]  HPI HA- pt reports 'it is very localized where you would think it's my sinuses'.  Pt reports she has had recurrent sxs 'for awhile'- 'at least a year'.  Starting Saturday, HA was more intense.  Always over L eye. No radiation of pain. Denies photo or phonophobia.  No nausea.  No aura.  No TTP.  No relief w/ tylenol or ibuprofen.  HA just needs to run its course.  Triggered by weather systems, perfume.  No food triggers.  Continues to take allergy medication regularly.  Pt feels she is having sxs ~4x/week.  No particularly timing pattern.  CT of frontal sinuses in 2019- normal.  Denies tension HA sxs.   Review of Systems For ROS see HPI   This visit occurred during the SARS-CoV-2 public health emergency.  Safety protocols were in place, including screening questions prior to the visit, additional usage of staff PPE, and extensive cleaning of exam room while observing appropriate contact time as indicated for disinfecting solutions.       Objective:   Physical Exam Vitals reviewed.  Constitutional:      General: She is not in acute distress.    Appearance: She is well-developed. She is not ill-appearing.  HENT:     Head: Normocephalic and atraumatic.  Eyes:     Extraocular Movements: Extraocular movements intact.     Right eye: Normal extraocular motion and no nystagmus.     Left eye: Normal extraocular motion and no nystagmus.     Pupils: Pupils are equal, round, and reactive to light.  Musculoskeletal:     Cervical back: Normal range of motion and neck supple.  Skin:    General: Skin is warm and dry.     Findings: No rash.  Neurological:     Mental Status: She is alert and oriented to person, place, and time.     Cranial Nerves: No cranial nerve deficit or facial asymmetry.     Coordination: Coordination normal.     Gait: Gait normal.  Psychiatric:        Mood and Affect: Mood  normal.        Speech: Speech normal.        Behavior: Behavior normal.           Assessment & Plan:  Recurrent headaches- new to provider, ongoing for pt for at least the last year.  Always localized above L eye, no radiation of pain.  No preceding aura.  No photo or phonophobia.  No nausea.  But no TTP like when she has 'typical sinus issues'.  Reviewed sinus CT done in 2019 and frontal sinuses were patent and normal.  Triggers include weather changes, perfumes.  No particular time of day and nothing improves them- they just run their course.  Pt denies tension headache sxs- no neck or back stiffness.  Unclear if this is sinus related, a migraine variant or other.  Encouraged a headache log and to take Tylenol at onset of next headache- rather than ibuprofen.  Then we can determine if we need neuro or ENT.  Pt expressed understanding and is in agreement w/ plan.

## 2020-10-27 ENCOUNTER — Encounter: Payer: Self-pay | Admitting: Family Medicine

## 2020-10-27 ENCOUNTER — Encounter: Payer: Self-pay | Admitting: Neurology

## 2020-10-27 DIAGNOSIS — R519 Headache, unspecified: Secondary | ICD-10-CM

## 2020-11-07 LAB — HM MAMMOGRAPHY

## 2020-11-11 ENCOUNTER — Other Ambulatory Visit: Payer: Self-pay | Admitting: Emergency Medicine

## 2020-11-11 ENCOUNTER — Encounter: Payer: Self-pay | Admitting: Emergency Medicine

## 2020-11-18 ENCOUNTER — Encounter: Payer: Self-pay | Admitting: Family Medicine

## 2020-12-29 NOTE — Progress Notes (Signed)
Virtual Visit via Video Note The purpose of this virtual visit is to provide medical care while limiting exposure to the novel coronavirus.    Consent was obtained for video visit:  yes Answered questions that patient had about telehealth interaction:  yes I discussed the limitations, risks, security and privacy concerns of performing an evaluation and management service by telemedicine. I also discussed with the patient that there may be a patient responsible charge related to this service. The patient expressed understanding and agreed to proceed.  Pt location: Home Physician Location: office Name of referring provider:  Sheliah Hatch, MD I connected with Francella Solian at patients initiation/request on 12/30/2020 at  7:50 AM EDT by video enabled telemedicine application and verified that I am speaking with the correct person using two identifiers. Pt MRN:  [id number] Pt DOB:  October 10, 1960 Video Participants:  Francella Solian  Assessment and Plan:   1.  I think that these "sinus headaches" are actually chronic migraine without aura, likely with rebound effect due to overuse of nasal decongestant (even the sinus CT from 2019 showed patent sinuses).  The nasal spray may be causing rebound nasal congestion and a trigger for the headache as well. 2.  Chronic sinus congestion.  In absence of sinusitis on CT, likely rebound effect of the nasal spray  She would like to avoid starting a medication for now. 1.  She will discontinue the nasal spray.  She is aware of rebound headache and rebound congestion.  If no improvement in 4 to 6 weeks, she will contact me and we can start a daily headache preventative. 2.  Limit use of pain relievers to no more than 2 days out of week to prevent risk of rebound or medication-overuse headache. 3.  She will try magnesium citrate 400mg , riboflavin 400mg  and CoQ10 300mg  daily 4.  Follow up in 6 months.  History of Present Illness:  Robin Mueller is a 60 year old right-handed female who presents for headaches.  History supplemented by referring provider's note.  CT sinuses from 2019 personally reviewed.  Onset:  At least a year (early 2021).  Location:  Over left eye Quality:  Pressure sometimes stabbing Intensity:  Normally mild- moderate, infrequently severe.  She denies new headache, thunderclap headache or severe headache that wakes her from sleep. Aura:  no Prodrome:  no Postdrome:  no Associated symptoms:  Sometimes photophobia.  She denies associated nausea, vomiting, phonophobia, osmophobia, visual disturbance, autonomic symptoms, unilateral numbness or weakness.  She does report congestion related to sinuses, not the headache. Duration:  All day, sometimes off and on Frequency:  4 days a week Frequency of abortive medication: 2 to 3 days a week Triggers:  Change in weather, perfume, sometimes her sinus NS triggers the headache Relieving factors:  Nothing else Activity:  Able to function recently with the headache, however it is difficult to complete work  She reports history of chronic sinusitis and therefore reports history of sinus headache.  These current headaches are somewhat similar but they have not resolved.  However she currently denies tenderness to palpation of her sinuses.  CT sinuses from February 2019 were clear other than minimal mucosal thickening along the ethmoid and maxillary sinuses.  Current NSAIDS/analgesics:  Tylenol (variable efficacy), ibuprofen 800mg  (variable efficacy) Current triptans:  none Current ergotamine:  none Current anti-emetic:  none Current muscle relaxants:  none Current Antihypertensive medications:  none Current Antidepressant medications:  none Current Anticonvulsant medications:  none Current  anti-CGRP:  none Current Vitamins/Herbal/Supplements:  B complex Current Antihistamines/Decongestants:  Azelastine-Fluticasone NS - uses daily Other therapy:  none Hormone/birth  control:  none   Past NSAIDS/analgesics:  none Past abortive triptans:  none Past abortive ergotamine:  none Past muscle relaxants:  Flexeril Past anti-emetic:  none Past antihypertensive medications:  none Past antidepressant medications:  none Past anticonvulsant medications:  none Past anti-CGRP:  none Past vitamins/Herbal/Supplements:  none Past antihistamines/decongestants:  Flonase Other past therapies:  none  Caffeine:  Decaf coffee.  Diet:  Hydrates.  Does not skip meals Exercise:  Works out daily Depression:  no; Anxiety:  A little bit once in awhile Other pain:  no Sleep hygiene:  ok Family history of headache:  Maternal grandmother (migraines), mother (headaches infrequently)   Past Medical History: Past Medical History:  Diagnosis Date  . Allergic rhinitis   . Allergy   . Anxiety   . Arthritis    hands, feet,back - otc med prn  . Colon polyp   . Depression   . Dysrhythmia    hx pvc,pac  . GERD (gastroesophageal reflux disease)   . IBS (irritable bowel syndrome)    diet controlled  . Internal hemorrhoid   . PONV (postoperative nausea and vomiting)   . Rosacea     Medications: Outpatient Encounter Medications as of 12/30/2020  Medication Sig  . Ascorbic Acid (VITAMIN C) 100 MG CHEW Chew by mouth daily. (Patient not taking: Reported on 10/14/2020)  . Azelastine-Fluticasone 137-50 MCG/ACT SUSP USE 1 TO 2 SPRAYS IN EACH NOSTRIL ONCE DAILY  . b complex vitamins tablet Take 1 tablet by mouth daily.  Marland Kitchen BIOTIN PO Take by mouth daily.  . Calcium 600-200 MG-UNIT per tablet Take 1 tablet by mouth daily.  Marland Kitchen DEXILANT 30 MG capsule TAKE 1 CAPSULE BY MOUTH EVERY DAY  . Fexofenadine HCl (MUCINEX ALLERGY PO) Take by mouth as needed.  . fish oil-omega-3 fatty acids 1000 MG capsule Take 1 g by mouth daily.  . Flaxseed, Linseed, (FLAX SEED OIL) 1000 MG CAPS Take by mouth daily.  Marland Kitchen ibuprofen (ADVIL) 800 MG tablet TAKE 1 TABLET BY MOUTH EVERY 8 HOURS AS NEEDED  . Polyethyl  Glycol-Propyl Glycol (SYSTANE) 0.4-0.3 % SOLN Apply 1 drop to eye in the morning and at bedtime.  Marland Kitchen tobramycin (TOBREX) 0.3 % ophthalmic solution Place 1 drop into both eyes every 6 (six) hours. (Patient not taking: Reported on 10/14/2020)   No facility-administered encounter medications on file as of 12/30/2020.    Allergies: Allergies  Allergen Reactions  . Amoxicillin     Diarrhea   . Adhesive [Tape] Rash  . Latex Rash    She says she has a mild reaction to this    Family History: Family History  Problem Relation Age of Onset  . Coronary artery disease Maternal Grandfather   . Heart disease Maternal Grandfather   . Hypertension Father   . Stroke Father   . Colon polyps Father   . Hypertension Mother   . Diabetes Mother   . Colon polyps Mother        precancerous  . Heart attack Mother   . Diverticulitis Mother   . Cancer Mother        breast  . Hypertension Brother   . Colon polyps Brother   . Diabetes Brother   . Colon cancer Neg Hx   . Stomach cancer Neg Hx   . Esophageal cancer Neg Hx   . Rectal cancer Neg Hx  Observations/Objective:   Height 5\' 6"  (1.676 m), weight 140 lb (63.5 kg). No acute distress.  Alert and oriented.  Speech fluent and not dysarthric.  Language intact.     Follow Up Instructions:    -I discussed the assessment and treatment plan with the patient. The patient was provided an opportunity to ask questions and all were answered. The patient agreed with the plan and demonstrated an understanding of the instructions.   The patient was advised to call back or seek an in-person evaluation if the symptoms worsen or if the condition fails to improve as anticipated.   , DO

## 2020-12-30 ENCOUNTER — Other Ambulatory Visit: Payer: Self-pay

## 2020-12-30 ENCOUNTER — Telehealth (INDEPENDENT_AMBULATORY_CARE_PROVIDER_SITE_OTHER): Payer: BC Managed Care – PPO | Admitting: Neurology

## 2020-12-30 ENCOUNTER — Encounter: Payer: Self-pay | Admitting: Neurology

## 2020-12-30 VITALS — Ht 66.0 in | Wt 140.0 lb

## 2020-12-30 DIAGNOSIS — R0981 Nasal congestion: Secondary | ICD-10-CM | POA: Diagnosis not present

## 2020-12-30 DIAGNOSIS — Z79899 Other long term (current) drug therapy: Secondary | ICD-10-CM

## 2020-12-30 DIAGNOSIS — G43709 Chronic migraine without aura, not intractable, without status migrainosus: Secondary | ICD-10-CM | POA: Diagnosis not present

## 2020-12-30 NOTE — Patient Instructions (Signed)
  1. Try to stop the nasal decongestant as it may be causing rebound congestion and headache. 2. Limit use of pain relievers to no more than 2 days out of the week.  These medications include acetaminophen, NSAIDs (ibuprofen/Advil/Motrin, naproxen/Aleve, triptans (Imitrex/sumatriptan), Excedrin, and narcotics.  This will help reduce risk of rebound headaches. 3. Be aware of common food triggers:  - Caffeine:  coffee, black tea, cola, Mt. Dew  - Chocolate  - Dairy:  aged cheeses (brie, blue, cheddar, gouda, [place], provolone, Red Jacket, Swiss, etc), chocolate milk, buttermilk, sour cream, limit eggs and yogurt  - Nuts, peanut butter  - Alcohol  - Cereals/grains:  FRESH breads (fresh bagels, sourdough, doughnuts), yeast productions  - Processed/canned/aged/cured meats (pre-packaged deli meats, hotdogs)  - MSG/glutamate:  soy sauce, flavor enhancer, pickled/preserved/marinated foods  - Sweeteners:  aspartame (Equal, Nutrasweet).  Sugar and Splenda are okay  - Vegetables:  legumes (lima beans, lentils, snow peas, fava beans, pinto peans, peas, garbanzo beans), sauerkraut, onions, olives, pickles  - Fruit:  avocados, bananas, citrus fruit (orange, lemon, grapefruit), mango  - Other:  Frozen meals, macaroni and cheese 4. Routine exercise 5. Stay adequately hydrated (aim for 64 or more oz water daily) 6. Keep headache diary 7. Maintain proper stress management 8. Maintain proper sleep hygiene 9. Do not skip meals 10. Consider supplements:  magnesium citrate 400mg  daily, riboflavin 400mg  daily, coenzyme Q10 300mg  daily.

## 2021-02-03 ENCOUNTER — Other Ambulatory Visit: Payer: Self-pay | Admitting: Family Medicine

## 2021-03-04 ENCOUNTER — Encounter: Payer: Self-pay | Admitting: *Deleted

## 2021-04-02 ENCOUNTER — Ambulatory Visit (HOSPITAL_BASED_OUTPATIENT_CLINIC_OR_DEPARTMENT_OTHER)
Admission: RE | Admit: 2021-04-02 | Discharge: 2021-04-02 | Disposition: A | Discharge status: Home / Self Care | Payer: BC Managed Care – PPO | Source: Ambulatory Visit | Attending: Emergency Medicine | Admitting: Emergency Medicine

## 2021-04-02 ENCOUNTER — Ambulatory Visit
Admission: RE | Admit: 2021-04-02 | Discharge: 2021-04-02 | Disposition: A | Discharge status: Home / Self Care | Payer: BC Managed Care – PPO | Source: Ambulatory Visit

## 2021-04-02 ENCOUNTER — Other Ambulatory Visit: Payer: Self-pay

## 2021-04-02 VITALS — BP 109/71 | HR 56 | Temp 98.0°F | Resp 18

## 2021-04-02 DIAGNOSIS — M25521 Pain in right elbow: Secondary | ICD-10-CM | POA: Insufficient documentation

## 2021-04-02 DIAGNOSIS — S59901A Unspecified injury of right elbow, initial encounter: Secondary | ICD-10-CM

## 2021-04-02 MED ORDER — TIZANIDINE HCL 2 MG PO TABS: 2.0000 mg | ORAL_TABLET | Freq: Four times a day (QID) | ORAL | 0 refills | Status: DC | PRN

## 2021-04-02 MED ORDER — TRAMADOL HCL 50 MG PO TABS: 50.0000 mg | ORAL_TABLET | Freq: Four times a day (QID) | ORAL | 0 refills | Status: DC | PRN

## 2021-04-02 NOTE — ED Triage Notes (Addendum)
Pt c/o right elbow pain and forearm, she hit arm yesterday on door. Pt describes pain as: burning and spasm. Has limited ROM, "dull" sensation. Interventions: Motrin 800mg - somewhat helpful, and ice pack

## 2021-04-02 NOTE — Discharge Instructions (Addendum)
Use anti-inflammatories for pain/swelling. You may take up to 800 mg Ibuprofen every 8 hours with food. You may supplement Ibuprofen with Tylenol [phone] mg every 8 hours.  Supplement with tizanidine which is a muscle relaxer for muscle spasming-may cause drowsiness, limit use with tramadol Tramadol for severe pain/nighttime pain-do not drive or work after taking Ice and elevate elbow Follow-up if not improving or worsening

## 2021-04-02 NOTE — ED Provider Notes (Signed)
[facility]    CSN: [id number] Arrival date & time: 04/02/21  1146      History   Chief Complaint Chief Complaint  Patient presents with   Arm Pain    HPI Robin Mueller is a 60 y.o. female presenting today for evaluation of right elbow pain.  Reports yesterday hit elbow on a door jam, declines immediate pain.  Went and exercised in the evening with rowing machine and weightlifting, later in the evening pain worsened and woke up with excruciating pain overnight.  Has developed swelling and bruising over the outer aspect of elbow and has had limited range of motion along with significant pain with most movement of her elbow forearm wrist/hand.  Denies history of prior problems with elbow.  Used ibuprofen 800 with some relief.  HPI  Past Medical History:  Diagnosis Date   Allergic rhinitis    Allergy    Anxiety    Arthritis    hands, feet,back - otc med prn   Colon polyp    Depression    Dysrhythmia    hx pvc,pac   GERD (gastroesophageal reflux disease)    IBS (irritable bowel syndrome)    diet controlled   Internal hemorrhoid    PONV (postoperative nausea and vomiting)    Rosacea     Patient Active Problem List   Diagnosis Date Noted   Latex allergy 05/31/2016   Hemorrhoids, internal, with bleeding 06/03/2015   Decreased hearing of both ears 01/20/2015   RLS (restless legs syndrome) 07/14/2014   Physical exam 10/19/2011   Plantar fasciitis 10/19/2011   Hand pain 05/26/2011   Seasonal and perennial allergic rhinitis 04/09/2009   ANXIETY DEPRESSION 02/26/2009   GERD 10/02/2008   IBS 10/02/2008    Past Surgical History:  Procedure Laterality Date   APPENDECTOMY     CARPAL TUNNEL RELEASE Bilateral    bilateral   CHOLECYSTECTOMY     COLONOSCOPY  2007   Gessner normal but hx polyps    CYST EXCISION Left    left hand and tendon release   FOOT NEUROMA SURGERY Right 2010   FOOT SURGERY Left    Debriement   HEMORRHOID BANDING  05/03/2016   MASS  EXCISION Right 12/05/2014   Procedure: EXCISION MASS, index finger;  Surgeon: Betha Loa, MD;  Location:  S[facility]ERY [facility];  Service: Orthopedics;  Laterality: Right;   NASAL SEPTUM SURGERY     right calcaneous surgery     TUMOR EXCISION Left    left lower leg skin   ulna nerve release Right 2007   ulna nerve release Left 2007   ULNAR NERVE TRANSPOSITION Right 2009   VAGINAL HYSTERECTOMY  2000    OB History   No obstetric history on file.      Home Medications    Prior to Admission medications   Medication Sig Start Date End Date Taking? Authorizing Provider  b complex vitamins tablet Take 1 tablet by mouth daily.   Yes [provider]  BIOTIN PO Take by mouth daily.   Yes [provider]  Calcium 600-200 MG-UNIT per tablet Take 1 tablet by mouth daily.   Yes [provider]  Dexlansoprazole 30 MG capsule TAKE 1 CAPSULE BY MOUTH EVERY DAY 02/03/21  Yes Sheliah Hatch, MD  fish oil-omega-3 fatty acids 1000 MG capsule Take 1 g by mouth daily.   Yes [provider]  Flaxseed, Linseed, (FLAX SEED OIL) 1000 MG CAPS Take by mouth daily.  Yes [provider]  ibuprofen (ADVIL) 800 MG tablet TAKE 1 TABLET BY MOUTH EVERY 8 HOURS AS NEEDED 01/01/19  Yes Sheliah Hatch, MD  magnesium oxide (MAG-OX) 400 MG tablet Take 400 mg by mouth daily.   Yes [provider]  tiZANidine (ZANAFLEX) 2 MG tablet Take 1-2 tablets (2-4 mg total) by mouth every 6 (six) hours as needed for muscle spasms. 04/02/21  Yes Cheryle Dark C, PA-C  traMADol (ULTRAM) 50 MG tablet Take 1 tablet (50 mg total) by mouth every 6 (six) hours as needed. 04/02/21  Yes Nelle Sayed C, PA-C  Turmeric 500 MG CAPS Take by mouth.   Yes [provider]  Ascorbic Acid (VITAMIN C) 100 MG CHEW Chew by mouth daily.    [provider]  Fexofenadine HCl (MUCINEX ALLERGY PO) Take by mouth as needed.    [provider]  Polyethyl Glycol-Propyl  Glycol (SYSTANE) 0.4-0.3 % SOLN Apply 1 drop to eye in the morning and at bedtime.    [provider]  tobramycin (TOBREX) 0.3 % ophthalmic solution Place 1 drop into both eyes every 6 (six) hours. Patient not taking: No sig reported 09/15/20   Olive Bass, FNP    Family History Family History  Problem Relation Age of Onset   Coronary artery disease Maternal Grandfather    Heart disease Maternal Grandfather    Hypertension Father    Stroke Father    Colon polyps Father    Hypertension Mother    Diabetes Mother    Colon polyps Mother        precancerous   Heart attack Mother    Diverticulitis Mother    Cancer Mother        breast   Hypertension Brother    Colon polyps Brother    Diabetes Brother    Colon cancer Neg Hx    Stomach cancer Neg Hx    Esophageal cancer Neg Hx    Rectal cancer Neg Hx     Social History Social History   Tobacco Use   Smoking status: Former    Packs/day: 1.00    Types: Cigarettes    Quit date: 09/06/1998    Years since quitting: 22.5   Smokeless tobacco: Never  Vaping Use   Vaping Use: Never used  Substance Use Topics   Alcohol use: Yes    Alcohol/week: 14.0 standard drinks    Types: 14 Glasses of wine per week    Comment: 0-2 per day   Drug use: No     Allergies   Amoxicillin, Adhesive [tape], and Latex   Review of Systems Review of Systems  Constitutional:  Negative for fatigue and fever.  HENT:  Negative for mouth sores.   Eyes:  Negative for visual disturbance.  Respiratory:  Negative for shortness of breath.   Cardiovascular:  Negative for chest pain.  Gastrointestinal:  Negative for abdominal pain, nausea and vomiting.  Genitourinary:  Negative for genital sores.  Musculoskeletal:  Positive for arthralgias and joint swelling.  Skin:  Negative for color change, rash and wound.  Neurological:  Negative for dizziness, weakness, light-headedness and headaches.    Physical Exam Triage Vital Signs ED  Triage Vitals  Enc Vitals Group     BP      Pulse      Resp      Temp      Temp src      SpO2      Weight  Height      Head Circumference      Peak Flow      Pain Score      Pain Loc      Pain Edu?      Excl. in GC?    No data found.  Updated Vital Signs BP 109/71 (BP Location: Left Arm)   Pulse (!) 56   Temp 98 F (36.7 C) (Oral)   Resp 18   SpO2 97%   Visual Acuity Right Eye Distance:   Left Eye Distance:   Bilateral Distance:    Right Eye Near:   Left Eye Near:    Bilateral Near:     Physical Exam Vitals and nursing note reviewed.  Constitutional:      Appearance: She is well-developed.     Comments: No acute distress  HENT:     Head: Normocephalic and atraumatic.     Nose: Nose normal.  Eyes:     Conjunctiva/sclera: Conjunctivae normal.  Cardiovascular:     Rate and Rhythm: Normal rate.  Pulmonary:     Effort: Pulmonary effort is normal. No respiratory distress.  Abdominal:     General: There is no distension.  Musculoskeletal:        General: Normal range of motion.     Cervical back: Neck supple.     Comments: Right elbow with moderate swelling and slight bruising noted over lateral epicondyle area with significant tenderness, extends slightly into forearm along the course of radius, limited extension to approximately 160 degrees, limited flexion to approximately 60 degrees, full supination, pronation, radial pulse 2+  Skin:    General: Skin is warm and dry.  Neurological:     Mental Status: She is alert and oriented to person, place, and time.     UC Treatments / Results  Labs (all labs ordered are listed, but only abnormal results are displayed) Labs Reviewed - No data to display  EKG   Radiology No results found.  Procedures Procedures (including critical care time)  Medications Ordered in UC Medications - No data to display  Initial Impression / Assessment and Plan / UC Course  I have reviewed the triage vital signs and  the nursing notes.  Pertinent labs & imaging results that were available during my care of the patient were reviewed by me and considered in my medical decision making (see chart for details).     Right elbow injury-low suspicion for fracture given no immediate pain after initial injury, but given amount of swelling and limited range of motion will place x-ray to rule out bony abnormality, recommending treating as sprain/contusion with Ace wrap, anti-inflammatories, trial of muscle relaxers given reported spasming sensation and tramadol for severe pain/nighttime pain.  Ice and elevate and monitor for gradual resolution.  Discussed strict return precautions. Patient verbalized understanding and is agreeable with plan.  Final Clinical Impressions(s) / UC Diagnoses   Final diagnoses:  Elbow injury, right, initial encounter     Discharge Instructions      Use anti-inflammatories for pain/swelling. You may take up to 800 mg Ibuprofen every 8 hours with food. You may supplement Ibuprofen with Tylenol [phone] mg every 8 hours.  Supplement with tizanidine which is a muscle relaxer for muscle spasming-may cause drowsiness, limit use with tramadol Tramadol for severe pain/nighttime pain-do not drive or work after taking Ice and elevate elbow Follow-up if not improving or worsening     ED Prescriptions     Medication Sig Dispense Auth. Provider  tiZANidine (ZANAFLEX) 2 MG tablet Take 1-2 tablets (2-4 mg total) by mouth every 6 (six) hours as needed for muscle spasms. 30 tablet Luvina Poirier C, PA-C   traMADol (ULTRAM) 50 MG tablet Take 1 tablet (50 mg total) by mouth every 6 (six) hours as needed. 8 tablet Kemuel Buchmann, [place] C, PA-C      I have reviewed the PDMP during this encounter.   Lew Dawes, PA-C 04/02/21 1227

## 2021-04-07 ENCOUNTER — Other Ambulatory Visit: Payer: Self-pay | Admitting: Family Medicine

## 2021-05-05 ENCOUNTER — Other Ambulatory Visit: Payer: Self-pay | Admitting: Family Medicine

## 2021-05-05 NOTE — Telephone Encounter (Signed)
Changes requested. Please advise   esomeprazole (NEXIUM) 10 MG packet       Changed from: Dexlansoprazole 30 MG capsule   All pharmacy suggested alternatives are listed below   Sig: N/A   Disp:  Not specified    Refills:  0   Start: 05/05/2021   Class: Normal   Non-formulary   Last ordered: 3 months ago by Sheliah Hatch, MD Last refill: 05/05/2021   Rx #: [id number]   Pharmacy comment: Alternative Requested:NOT ON FORMULARY.  All Pharmacy Suggested Alternatives:   esomeprazole (NEXIUM) 10 MG packet lansoprazole (PREVACID SOLUTAB) 15 MG disintegrating tablet pantoprazole (PROTONIX) 20 MG tablet omeprazole (PRILOSEC) 10 MG capsule lansoprazole (PREVACID) 15 MG capsule  To prescribe one of the alternatives listed above, open the encounter and click Replace.  Open

## 2021-05-12 ENCOUNTER — Encounter: Payer: Self-pay | Admitting: Family Medicine

## 2021-05-12 ENCOUNTER — Other Ambulatory Visit: Payer: Self-pay | Admitting: Family

## 2021-05-12 MED ORDER — ESOMEPRAZOLE MAGNESIUM 10 MG PO PACK: 10.0000 mg | PACK | Freq: Every day | ORAL | 1 refills | Status: DC

## 2021-05-25 MED ORDER — ESOMEPRAZOLE MAGNESIUM 40 MG PO CPDR: 40.0000 mg | DELAYED_RELEASE_CAPSULE | Freq: Every day | ORAL | 3 refills | Status: DC

## 2021-06-09 ENCOUNTER — Encounter: Payer: Self-pay | Admitting: Family Medicine

## 2021-06-12 ENCOUNTER — Telehealth (INDEPENDENT_AMBULATORY_CARE_PROVIDER_SITE_OTHER): Payer: BC Managed Care – PPO | Admitting: Internal Medicine

## 2021-06-12 ENCOUNTER — Other Ambulatory Visit: Payer: Self-pay

## 2021-06-12 ENCOUNTER — Encounter: Payer: Self-pay | Admitting: Internal Medicine

## 2021-06-12 VITALS — Ht 66.0 in | Wt 133.0 lb

## 2021-06-12 DIAGNOSIS — J019 Acute sinusitis, unspecified: Secondary | ICD-10-CM | POA: Diagnosis not present

## 2021-06-12 MED ORDER — AZITHROMYCIN 250 MG PO TABS: ORAL_TABLET | ORAL | 0 refills | Status: DC

## 2021-06-12 NOTE — Progress Notes (Signed)
Subjective:    Patient ID: Robin Mueller, female    DOB: 06-03-1961, 60 y.o.   MRN: [id number]  DOS:  06/12/2021 Type of visit - description: Virtual Visit via Video Note  I connected with the above patient  by a video enabled telemedicine application and verified that I am speaking with the correct person using two identifiers.   THIS ENCOUNTER IS A VIRTUAL VISIT DUE TO COVID-19 - PATIENT WAS NOT SEEN IN THE OFFICE. PATIENT HAS CONSENTED TO VIRTUAL VISIT / TELEMEDICINE VISIT   Location of patient: home  Location of provider: office  Persons participating in the virtual visit: patient, provider   I discussed the limitations of evaluation and management by telemedicine and the availability of in person appointments. The patient expressed understanding and agreed to proceed.  Acute  Symptoms a started 6 days ago: Sore throat, at least moderate, it lasted 3 days, then started with sinus congestion, postnasal dripping and cough. She does have a very small amount of sputum production but no chest congestion. Had 3 COVID tests, all negative. States  feels like her "typical sinus infection" for her.   Review of Systems Denies fever chills No chest pain no difficulty breathing No nausea or vomiting  Past Medical History:  Diagnosis Date   Allergic rhinitis    Allergy    Anxiety    Arthritis    hands, feet,back - otc med prn   Colon polyp    Depression    Dysrhythmia    hx pvc,pac   GERD (gastroesophageal reflux disease)    IBS (irritable bowel syndrome)    diet controlled   Internal hemorrhoid    PONV (postoperative nausea and vomiting)    Rosacea     Past Surgical History:  Procedure Laterality Date   APPENDECTOMY     CARPAL TUNNEL RELEASE Bilateral    bilateral   CHOLECYSTECTOMY     COLONOSCOPY  2007   Gessner normal but hx polyps    CYST EXCISION Left    left hand and tendon release   FOOT NEUROMA SURGERY Right 2010   FOOT SURGERY Left    Debriement    HEMORRHOID BANDING  05/03/2016   MASS EXCISION Right 12/05/2014   Procedure: EXCISION MASS, index finger;  Surgeon: Betha Loa, MD;  Location: [facility];  Service: Orthopedics;  Laterality: Right;   NASAL SEPTUM SURGERY     right calcaneous surgery     TUMOR EXCISION Left    left lower leg skin   ulna nerve release Right 2007   ulna nerve release Left 2007   ULNAR NERVE TRANSPOSITION Right 2009   VAGINAL HYSTERECTOMY  2000    Allergies as of 06/12/2021       Reactions   Amoxicillin    Diarrhea   Adhesive [tape] Rash   Latex Rash   She says she has a mild reaction to this        Medication List        Accurate as of June 12, 2021 11:59 PM. If you have any questions, ask your nurse or doctor.          STOP taking these medications    tobramycin 0.3 % ophthalmic solution Commonly known as: Tobrex Stopped by: Willow Ora, MD       TAKE these medications    azithromycin 250 MG tablet Commonly known as: Zithromax Z-Pak 2 tabs a day the first day, then 1 tab a day x 4 days  Started by: Willow Ora, MD   b complex vitamins tablet Take 1 tablet by mouth daily.   BIOTIN PO Take by mouth daily.   Calcium 600-200 MG-UNIT tablet Take 1 tablet by mouth daily.   esomeprazole 40 MG capsule Commonly known as: NexIUM Take 1 capsule (40 mg total) by mouth daily.   fish oil-omega-3 fatty acids 1000 MG capsule Take 1 g by mouth daily.   Flax Seed Oil 1000 MG Caps Take by mouth daily.   ibuprofen 800 MG tablet Commonly known as: ADVIL TAKE 1 TABLET BY MOUTH EVERY 8 HOURS AS NEEDED   magnesium oxide 400 MG tablet Commonly known as: MAG-OX Take 400 mg by mouth daily.   MUCINEX ALLERGY PO Take by mouth as needed.   Systane 0.4-0.3 % Soln Generic drug: Polyethyl Glycol-Propyl Glycol Apply 1 drop to eye in the morning and at bedtime.   tiZANidine 2 MG tablet Commonly known as: ZANAFLEX Take 1-2 tablets (2-4 mg total) by mouth every 6 (six) hours  as needed for muscle spasms.   traMADol 50 MG tablet Commonly known as: ULTRAM Take 1 tablet (50 mg total) by mouth every 6 (six) hours as needed.   Turmeric 500 MG Caps Take by mouth.   Vitamin C 100 MG Chew Chew by mouth daily.           Objective:   Physical Exam Ht 5\' 6"  (1.676 m)   Wt 133 lb (60.3 kg)   BMI 21.47 kg/m  This is a virtual video visit, alert oriented x3, in no apparent distress, nose congestion noted, no cough.    Assessment      60 year old female, healthy with no diabetes, HTN or heart disease, presents with:  Sinusitis The patient knows the limitations of a virtual evaluation but sxs c/w sinusitis. Symptoms started 6 days ago, she had 2 home and 1 PCR covid test:  All negative. Plan: Zithromax Continue Mucinex, PCP recommended Allegra-D which seems to be helping. Rest, fluids, call if not gradually better States is unable to take nasal sprays.     I discussed the assessment and treatment plan with the patient. The patient was provided an opportunity to ask questions and all were answered. The patient agreed with the plan and demonstrated an understanding of the instructions.   The patient was advised to call back or seek an in-person evaluation if the symptoms worsen or if the condition fails to improve as anticipated.

## 2021-06-25 ENCOUNTER — Encounter: Payer: Self-pay | Admitting: Family Medicine

## 2021-06-29 NOTE — Progress Notes (Signed)
NEUROLOGY FOLLOW UP OFFICE NOTE  Robin Mueller [id number]  Assessment/Plan:   Migraine without aura vs tension-type headache, improved  Migraine prevention:  In addition to magnesium 400mg , will start riboflavin 400mg  daily and CoQ10 100mg  three times daily Migraine rescue:  Robin Mueller will try sumatriptan 100mg  Limit use of pain relievers to no more than 2 days out of week to prevent risk of rebound or medication-overuse headache. Keep headache diary Follow up 6 months   Subjective:  Robin Mueller is a 60 year old right-handed female who follows up for migraines.  UPDATE: Last visit, Robin Mueller was advised to discontinue the decongestant nasal sprays.   Intensity:  mild to moderate Duration:  off and on all day Frequency:  5 to 7 days a month Frequency of abortive medication: ibuprofen 3 days a month (may take more frequently if knee pain flares up) Current NSAIDS/analgesics:  Tylenol (variable efficacy), ibuprofen 800mg  (variable efficacy) Current triptans:  none Current ergotamine:  none Current anti-emetic:  none Current muscle relaxants:  none Current Antihypertensive medications:  none Current Antidepressant medications:  none Current Anticonvulsant medications:  none Current anti-CGRP:  none Current Vitamins/Herbal/Supplements:  magnesium oxide 400mg , CoQ10 300mg  daily, B complex Current Antihistamines/Decongestants:  Azelastine-Fluticasone NS - uses daily Other therapy:  none Hormone/birth control:  none  Caffeine:  Decaf coffee.  Diet:  Hydrates.  Does not skip meals Exercise:  Works out daily Depression:  no; Anxiety:  A little bit once in awhile Other pain:  no Sleep hygiene:  ok  HISTORY: Onset:  At least a year (early 2021).  Location:  Over left eye Quality:  Pressure sometimes stabbing Initial Intensity:  Normally mild- moderate, infrequently severe.  Robin Mueller denies new headache, thunderclap headache or severe headache that wakes Robin Mueller from sleep. Aura:   no Prodrome:  no Postdrome:  no Associated symptoms:  Sometimes photophobia.  Robin Mueller denies associated nausea, vomiting, phonophobia, osmophobia, visual disturbance, autonomic symptoms, unilateral numbness or weakness.  Robin Mueller does report congestion related to sinuses, not the headache. Initial Duration:  All day, sometimes off and on Initial Frequency:  4 days a week Frequency of abortive medication: 2 to 3 days a week Triggers:  Change in weather, perfume, sometimes Robin Mueller sinus NS triggers the headache Relieving factors:  Nothing else Activity:  Able to function recently with the headache, however it is difficult to complete work   Robin Mueller reports history of chronic sinusitis and therefore reports history of sinus headache.  These current headaches are somewhat similar but they have not resolved.  However Robin Mueller currently denies tenderness to palpation of Robin Mueller sinuses.  CT sinuses from February 2019 were clear other than minimal mucosal thickening along the ethmoid and maxillary sinuses.     Past NSAIDS/analgesics:  none Past abortive triptans:  none Past abortive ergotamine:  none Past muscle relaxants:  Flexeril Past anti-emetic:  none Past antihypertensive medications:  none Past antidepressant medications:  none Past anticonvulsant medications:  none Past anti-CGRP:  none Past vitamins/Herbal/Supplements:  none Past antihistamines/decongestants:  Flonase Other past therapies:  none    Family history of headache:  Maternal grandmother (migraines), mother (headaches infrequently)  PAST MEDICAL HISTORY: Past Medical History:  Diagnosis Date   Allergic rhinitis    Allergy    Anxiety    Arthritis    hands, feet,back - otc med prn   Colon polyp    Depression    Dysrhythmia    hx pvc,pac   GERD (gastroesophageal reflux disease)    IBS (  irritable bowel syndrome)    diet controlled   Internal hemorrhoid    PONV (postoperative nausea and vomiting)    Rosacea     MEDICATIONS: Current  Outpatient Medications on File Prior to Visit  Medication Sig Dispense Refill   Ascorbic Acid (VITAMIN C) 100 MG CHEW Chew by mouth daily.     azithromycin (ZITHROMAX Z-PAK) 250 MG tablet 2 tabs a day the first day, then 1 tab a day x 4 days 6 tablet 0   b complex vitamins tablet Take 1 tablet by mouth daily.     BIOTIN PO Take by mouth daily.     Calcium 600-200 MG-UNIT per tablet Take 1 tablet by mouth daily.     esomeprazole (NEXIUM) 40 MG capsule Take 1 capsule (40 mg total) by mouth daily. 30 capsule 3   Fexofenadine HCl (MUCINEX ALLERGY PO) Take by mouth as needed.     fish oil-omega-3 fatty acids 1000 MG capsule Take 1 g by mouth daily.     Flaxseed, Linseed, (FLAX SEED OIL) 1000 MG CAPS Take by mouth daily.     ibuprofen (ADVIL) 800 MG tablet TAKE 1 TABLET BY MOUTH EVERY 8 HOURS AS NEEDED 180 tablet 0   magnesium oxide (MAG-OX) 400 MG tablet Take 400 mg by mouth daily.     Polyethyl Glycol-Propyl Glycol (SYSTANE) 0.4-0.3 % SOLN Apply 1 drop to eye in the morning and at bedtime.     tiZANidine (ZANAFLEX) 2 MG tablet Take 1-2 tablets (2-4 mg total) by mouth every 6 (six) hours as needed for muscle spasms. (Patient not taking: Reported on 06/12/2021) 30 tablet 0   traMADol (ULTRAM) 50 MG tablet Take 1 tablet (50 mg total) by mouth every 6 (six) hours as needed. (Patient not taking: Reported on 06/12/2021) 8 tablet 0   Turmeric 500 MG CAPS Take by mouth.     No current facility-administered medications on file prior to visit.    ALLERGIES: Allergies  Allergen Reactions   Amoxicillin     Diarrhea    Adhesive [Tape] Rash   Latex Rash    Robin Mueller says Robin Mueller has a mild reaction to this    FAMILY HISTORY: Family History  Problem Relation Age of Onset   Coronary artery disease Maternal Grandfather    Heart disease Maternal Grandfather    Hypertension Father    Stroke Father    Colon polyps Father    Hypertension Mother    Diabetes Mother    Colon polyps Mother        precancerous    Heart attack Mother    Diverticulitis Mother    Cancer Mother        breast   Hypertension Brother    Colon polyps Brother    Diabetes Brother    Colon cancer Neg Hx    Stomach cancer Neg Hx    Esophageal cancer Neg Hx    Rectal cancer Neg Hx       Objective:  Blood pressure 123/80, pulse 71, height 5\' 6"  (1.676 m), weight 135 lb (61.2 kg), SpO2 98 %. General: No acute distress.  Patient appears well-groomed.   Head:  Normocephalic/atraumatic Eyes:  Fundi examined but not visualized Neck: supple, no paraspinal tenderness, full range of motion Heart:  Regular rate and rhythm Lungs:  Clear to auscultation bilaterally Back: No paraspinal tenderness Neurological Exam: alert and oriented to person, place, and time.  Speech fluent and not dysarthric, language intact.  CN II-XII intact. Bulk and tone  normal, muscle strength 5/5 throughout.  Sensation to light touch intact.  Deep tendon reflexes 2+ throughout, toes downgoing.  Finger to nose testing intact.  Gait normal, Romberg negative.   Shon Millet, DO  CC: [name], MD

## 2021-07-01 ENCOUNTER — Ambulatory Visit: Payer: BC Managed Care – PPO | Admitting: Neurology

## 2021-07-01 ENCOUNTER — Encounter: Payer: Self-pay | Admitting: Neurology

## 2021-07-01 ENCOUNTER — Other Ambulatory Visit: Payer: Self-pay

## 2021-07-01 VITALS — BP 123/80 | HR 71 | Ht 66.0 in | Wt 135.0 lb

## 2021-07-01 DIAGNOSIS — G43009 Migraine without aura, not intractable, without status migrainosus: Secondary | ICD-10-CM

## 2021-07-01 MED ORDER — SUMATRIPTAN SUCCINATE 100 MG PO TABS: ORAL_TABLET | ORAL | 5 refills | Status: DC

## 2021-07-01 NOTE — Patient Instructions (Signed)
In addition to magnesium citrate 400mg  daily, take riboflavin (B2) 400mg  daily and coenzyme Q10 100mg  three times daily Take sumatriptan 100mg  at earliest onset of headache.  May repeat in 2 hours.  Maximum 2 tablets in 24 hours. Limit use of pain relievers to no more than 2 days out of week to prevent risk of rebound or medication-overuse headache. Keep headache diary Follow up 6 months.

## 2021-07-21 ENCOUNTER — Ambulatory Visit (INDEPENDENT_AMBULATORY_CARE_PROVIDER_SITE_OTHER): Payer: BC Managed Care – PPO | Admitting: Family Medicine

## 2021-07-21 ENCOUNTER — Encounter: Payer: Self-pay | Admitting: Family Medicine

## 2021-07-21 VITALS — BP 110/80 | HR 62 | Temp 97.8°F | Resp 16 | Ht 65.5 in | Wt 135.2 lb

## 2021-07-21 DIAGNOSIS — R1011 Right upper quadrant pain: Secondary | ICD-10-CM | POA: Diagnosis not present

## 2021-07-21 DIAGNOSIS — F341 Dysthymic disorder: Secondary | ICD-10-CM | POA: Diagnosis not present

## 2021-07-21 DIAGNOSIS — E785 Hyperlipidemia, unspecified: Secondary | ICD-10-CM

## 2021-07-21 DIAGNOSIS — Z0000 Encounter for general adult medical examination without abnormal findings: Secondary | ICD-10-CM | POA: Diagnosis not present

## 2021-07-21 LAB — BASIC METABOLIC PANEL
BUN: 16 mg/dL (ref 6–23)
CO2: 29 mEq/L (ref 19–32)
Calcium: 9.5 mg/dL (ref 8.4–10.5)
Chloride: 100 mEq/L (ref 96–112)
Creatinine, Ser: 0.88 mg/dL (ref 0.40–1.20)
GFR: 72.4 mL/min (ref >60.00)
Glucose, Bld: 87 mg/dL (ref 70–99)
Potassium: 4.2 mEq/L (ref 3.5–5.1)
Sodium: 137 mEq/L (ref 135–145)

## 2021-07-21 LAB — CBC WITH DIFFERENTIAL/PLATELET
Basophils Absolute: 0 10*3/uL (ref 0.0–0.1)
Basophils Relative: 0.7 % (ref 0.0–3.0)
Eosinophils Absolute: 0.2 10*3/uL (ref 0.0–0.7)
Eosinophils Relative: 3.2 % (ref 0.0–5.0)
HCT: 41.3 % (ref 36.0–46.0)
Hemoglobin: 13.8 g/dL (ref 12.0–15.0)
Lymphocytes Relative: 36.6 % (ref 12.0–46.0)
Lymphs Abs: 2.2 10*3/uL (ref 0.7–4.0)
MCHC: 33.5 g/dL (ref 30.0–36.0)
MCV: 96.3 fl (ref 78.0–100.0)
Monocytes Absolute: 0.5 10*3/uL (ref 0.1–1.0)
Monocytes Relative: 7.7 % (ref 3.0–12.0)
Neutro Abs: 3.2 10*3/uL (ref 1.4–7.7)
Neutrophils Relative %: 51.8 % (ref 43.0–77.0)
Platelets: 279 10*3/uL (ref 150.0–400.0)
RBC: 4.29 Mil/uL (ref 3.87–5.11)
RDW: 14.4 % (ref 11.5–15.5)
WBC: 6.1 10*3/uL (ref 4.0–10.5)

## 2021-07-21 LAB — LIPID PANEL
Cholesterol: 262 mg/dL — ABNORMAL HIGH (ref 0–200)
HDL: 72.5 mg/dL (ref >39.00)
LDL Cholesterol: 159 mg/dL — ABNORMAL HIGH (ref 0–99)
NonHDL: 189.54
Total CHOL/HDL Ratio: 4
Triglycerides: 152 mg/dL — ABNORMAL HIGH (ref 0.0–149.0)
VLDL: 30.4 mg/dL (ref 0.0–40.0)

## 2021-07-21 LAB — HEPATIC FUNCTION PANEL
ALT: 26 U/L (ref 0–35)
AST: 29 U/L (ref 0–37)
Albumin: 4.7 g/dL (ref 3.5–5.2)
Alkaline Phosphatase: 47 U/L (ref 39–117)
Bilirubin, Direct: 0.1 mg/dL (ref 0.0–0.3)
Total Bilirubin: 0.7 mg/dL (ref 0.2–1.2)
Total Protein: 6.8 g/dL (ref 6.0–8.3)

## 2021-07-21 LAB — TSH: TSH: 4.14 u[IU]/mL (ref 0.35–5.50)

## 2021-07-21 MED ORDER — FLUOXETINE HCL 10 MG PO CAPS: 10.0000 mg | ORAL_CAPSULE | Freq: Every day | ORAL | 3 refills | Status: DC

## 2021-07-21 NOTE — Progress Notes (Signed)
Subjective:    Patient ID: Robin Mueller, female    DOB: 1961/04/15, 60 y.o.   MRN: [id number]  HPI CPE- UTD on colonoscopy, mammo, flu, Tdap.  Patient Care Team    Relationship Specialty Notifications Start End  Midge Minium, MD PCP - General   08/19/10   Gatha Mayer, MD Consulting Physician Gastroenterology  07/21/21      Health Maintenance  Topic Date Due   Pneumococcal Vaccine 48-11 Years old (1 - PCV) Never done   HIV Screening  Never done   COVID-19 Vaccine (4 - Booster for [place] series) 07/27/2021   MAMMOGRAM  11/07/2021   TETANUS/TDAP  10/23/2022   COLONOSCOPY (Pts 45-42yrs Insurance coverage will need to be confirmed)  08/03/2025   INFLUENZA VACCINE  Completed   Hepatitis C Screening  Completed   Zoster Vaccines- Shingrix  Completed   HPV VACCINES  Aged Out      Review of Systems Patient reports no vision/ hearing changes, adenopathy,fever, weight change,  persistant/recurrent hoarseness , swallowing issues, chest pain, palpitations, edema, persistant/recurrent cough, hemoptysis, dyspnea (rest/exertional/paroxysmal nocturnal), gastrointestinal bleeding (melena, rectal bleeding), significant heartburn, bowel changes, GU symptoms (dysuria, hematuria, incontinence), Gyn symptoms (abnormal  bleeding, pain),  syncope, focal weakness, memory loss, numbness & tingling, skin/hair/nail changes, abnormal bruising or bleeding.   + anxiety/depression- reports feeling 'totally overwhelmed'.  + anger.  Feels like 'a failure'.  Previously on Prozac and that seemed to work well.  + RUQ pain- intermittent, not sharp.  Can wake her from sleep.  Mild nausea.  Not TTP.  Sxs started ~4-5 months ago.  No relation to food.  S/p cholecystectomy.    This visit occurred during the SARS-CoV-2 public health emergency.  Safety protocols were in place, including screening questions prior to the visit, additional usage of staff PPE, and extensive cleaning of exam room while observing  appropriate contact time as indicated for disinfecting solutions.      Objective:   Physical Exam General Appearance:    Alert, cooperative, no distress, appears stated age  Head:    Normocephalic, without obvious abnormality, atraumatic  Eyes:    PERRL, conjunctiva/corneas clear, EOM's intact, fundi    benign, both eyes  Ears:    Normal TM's and external ear canals, both ears  Nose:   Deferred due to COVID  Throat:   Neck:   Supple, symmetrical, trachea midline, no adenopathy;    Thyroid: no enlargement/tenderness/nodules  Back:     Symmetric, no curvature, ROM normal, no CVA tenderness  Lungs:     Clear to auscultation bilaterally, respirations unlabored  Chest Wall:    No tenderness or deformity   Heart:    Regular rate and rhythm, S1 and S2 normal, no murmur, rub   or gallop  Breast Exam:    Deferred to mammo  Abdomen:     Soft, non-tender, bowel sounds active all four quadrants,    no masses, no organomegaly  Genitalia:    Deferred  Rectal:    Extremities:   Extremities normal, atraumatic, no cyanosis or edema  Pulses:   2+ and symmetric all extremities  Skin:   Skin color, texture, turgor normal, no rashes or lesions  Lymph nodes:   Cervical, supraclavicular, and axillary nodes normal  Neurologic:   CNII-XII intact, normal strength, sensation and reflexes    throughout          Assessment & Plan:  RUQ pain- new.  Pt reports this started over the summer.  No TTP on exam.  Tends to occur when she needs to have a BM and then improves afterwards.  No N/V/D.  Not related to food.  Can wake her from sleep at times.  Pain will come and go.  Suspect this is due to gas pain or constipation (pt has already had gallbladder removed) but will have her log her sxs and we will determine if [place] is needed.  Pt expressed understanding and is in agreement w/ plan.

## 2021-07-21 NOTE — Assessment & Plan Note (Signed)
Pt's PE WNL and unchanged from previous.  UTD on colonoscopy, mammo, Tdap, and flu.  Check labs.  Anticipatory guidance provided.

## 2021-07-21 NOTE — Assessment & Plan Note (Signed)
Ongoing issue for pt.  Attempting to control w/ diet and exercise.  Check labs and start meds if needed

## 2021-07-21 NOTE — Patient Instructions (Signed)
Follow up in 4-6 weeks to recheck mood We'll notify you of your lab results and make any changes if needed Keep up the great work on healthy diet and regular exercise- you're doing great!!! START the Fluoxetine once daily for mood Keep a log of your abdominal pain to see what causes it, what improves it, how long it lasts, etc If it doesn't improve or worsens, we'll do an ultrasound Call with any questions or concerns Stay Safe!  Stay Healthy! Hang in there!!! Happy Thanksgiving!

## 2021-07-21 NOTE — Assessment & Plan Note (Signed)
Deteriorated.  Pt is having a really hard time adjusting to her new normal now that dad lives w/ her full time.  Relationship w/ brother has been tense and he is not helping w/ dad's day to day care at all.  Pt has had no respite since mother died.  Feels like a failure at work, is overwhelmed.  Will restart SSRI and monitor closely for improvement.  Pt expressed understanding and is in agreement w/ plan.

## 2021-08-12 ENCOUNTER — Other Ambulatory Visit: Payer: Self-pay | Admitting: Family Medicine

## 2021-08-13 ENCOUNTER — Encounter: Payer: Self-pay | Admitting: Family Medicine

## 2021-08-13 DIAGNOSIS — Z1283 Encounter for screening for malignant neoplasm of skin: Secondary | ICD-10-CM

## 2021-09-02 ENCOUNTER — Encounter: Payer: Self-pay | Admitting: Family Medicine

## 2021-09-02 DIAGNOSIS — R1011 Right upper quadrant pain: Secondary | ICD-10-CM

## 2021-09-04 ENCOUNTER — Other Ambulatory Visit: Payer: Self-pay

## 2021-09-04 ENCOUNTER — Ambulatory Visit (HOSPITAL_BASED_OUTPATIENT_CLINIC_OR_DEPARTMENT_OTHER)
Admission: RE | Admit: 2021-09-04 | Discharge: 2021-09-04 | Disposition: A | Discharge status: Home / Self Care | Payer: BC Managed Care – PPO | Source: Ambulatory Visit | Attending: Family Medicine | Admitting: Family Medicine

## 2021-09-04 DIAGNOSIS — R1011 Right upper quadrant pain: Secondary | ICD-10-CM | POA: Diagnosis not present

## 2021-09-04 IMAGING — US US ABDOMEN COMPLETE
1 series · 13 of 25 positions shown · non-contrast
Comparison: None.

CLINICAL DATA: Pain for a month.  Previous cholecystectomy.

EXAM:
ABDOMEN ULTRASOUND COMPLETE

[Series 1: us abdomen complete · 13 of 93 slices shown]
[im 1/93]
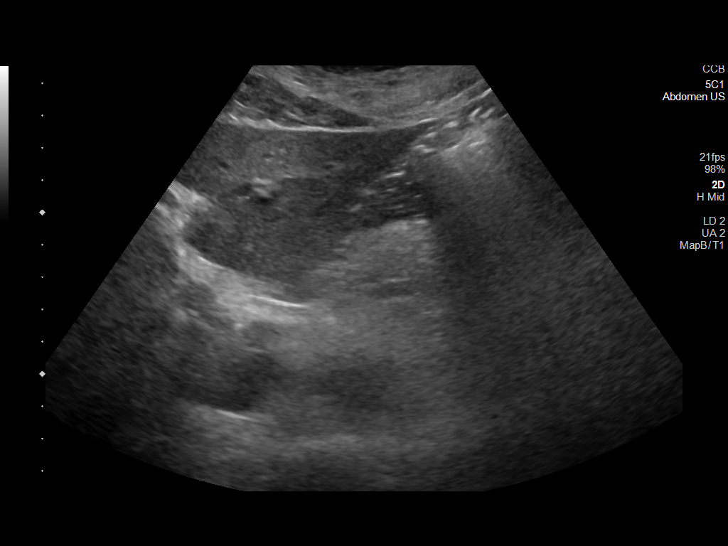
[im 8/93]
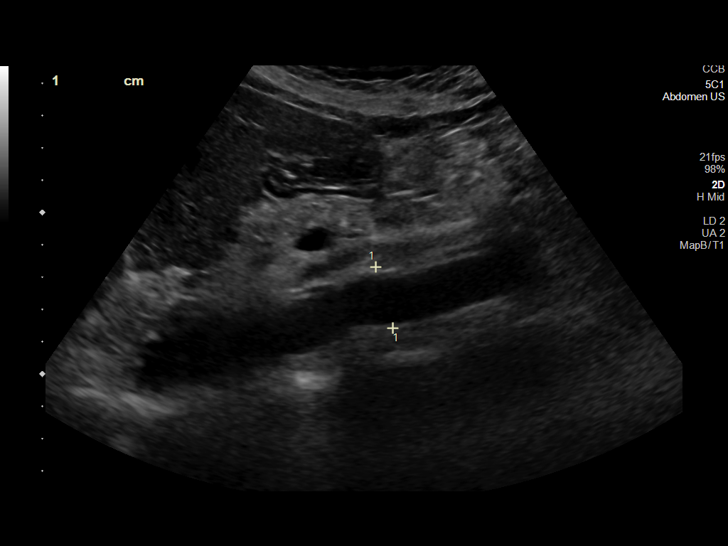
[im 16/93]
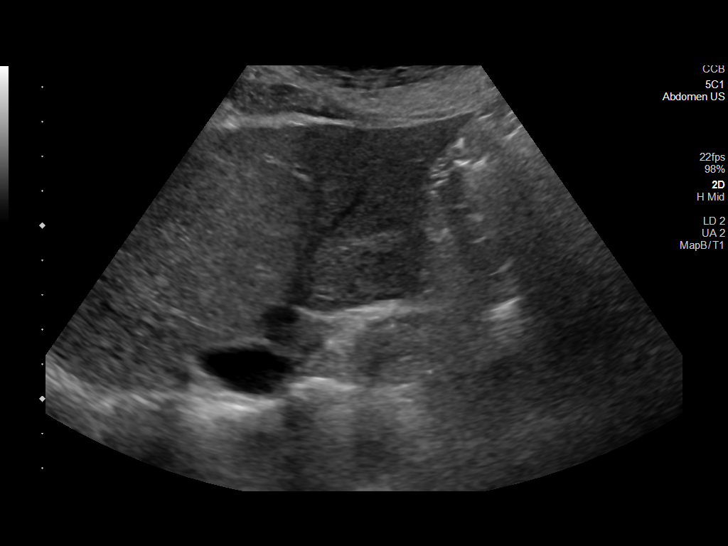
[im 24/93]
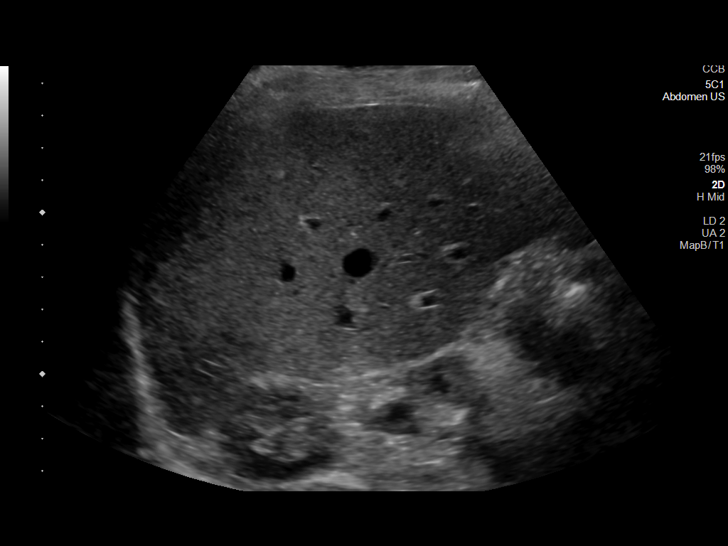
[im 31/93]
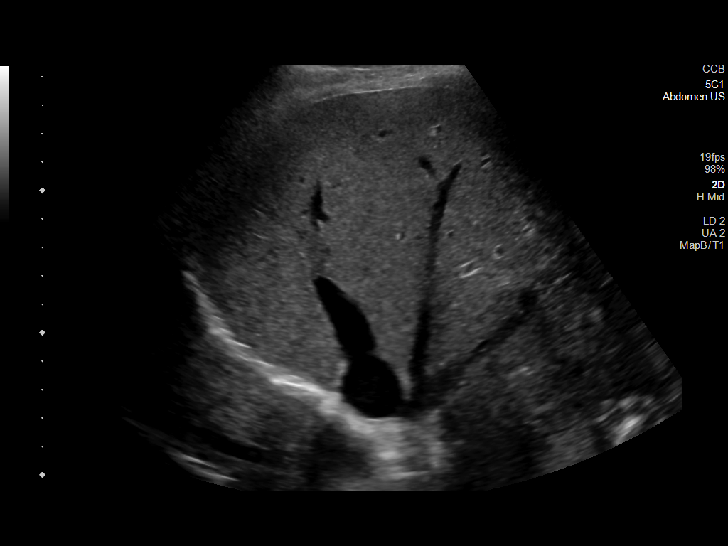
[im 39/93]
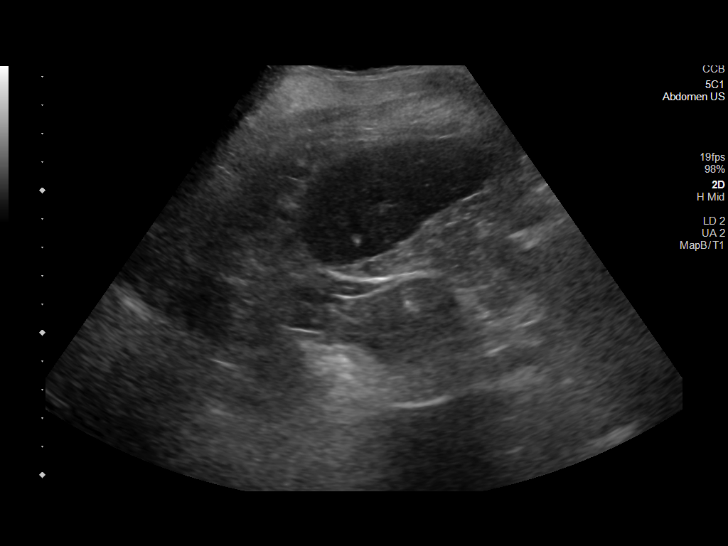
[im 47/93]
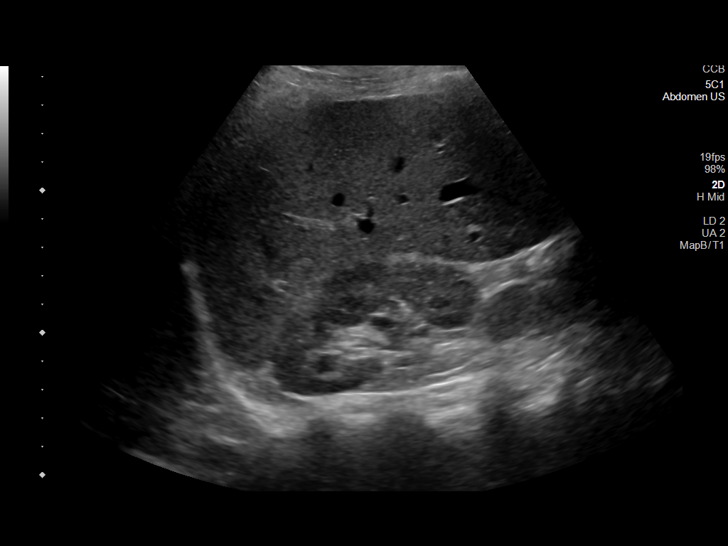
[im 54/93]
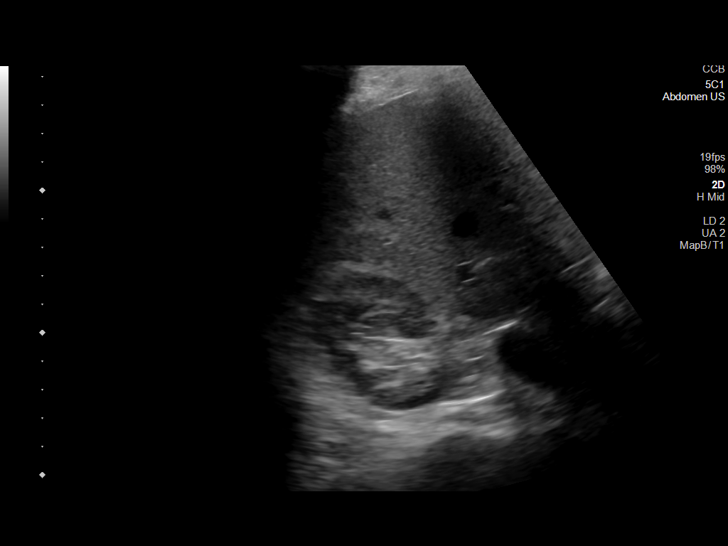
[im 62/93]
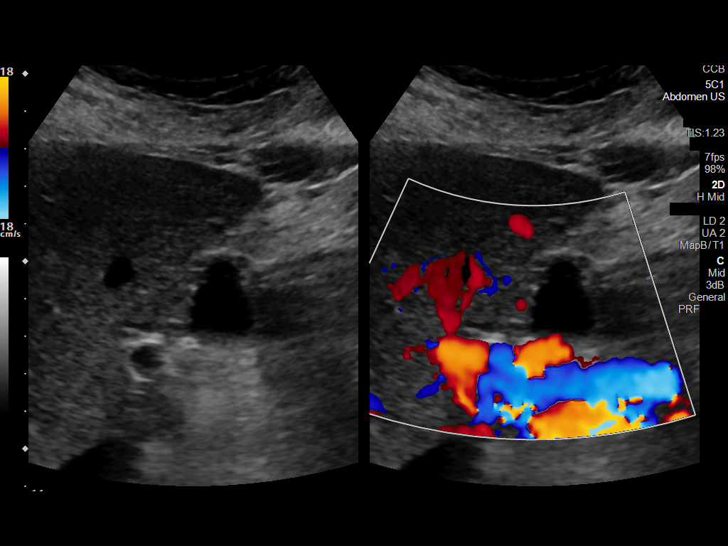
[im 70/93]
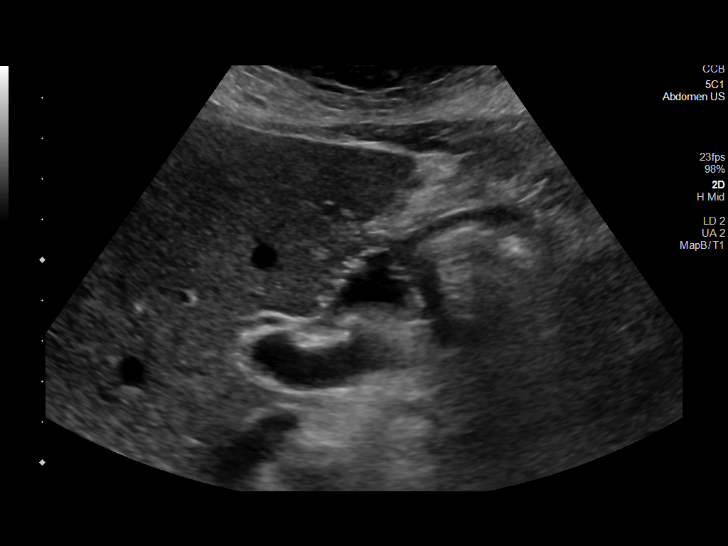
[im 77/93]
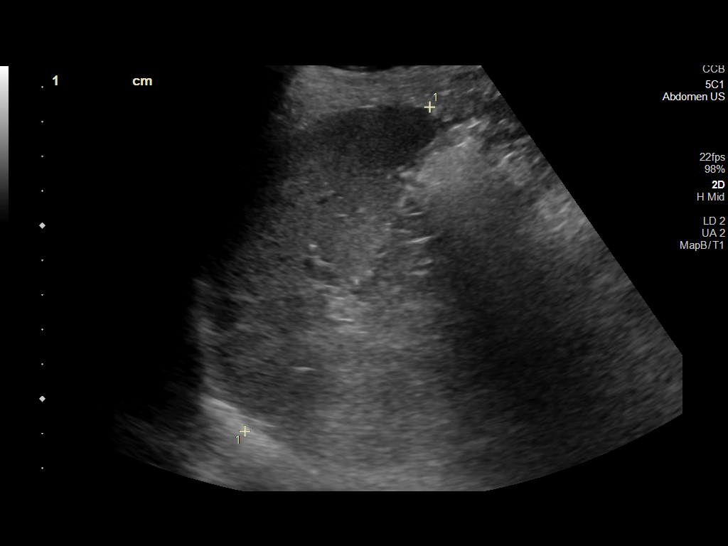
[im 85/93]
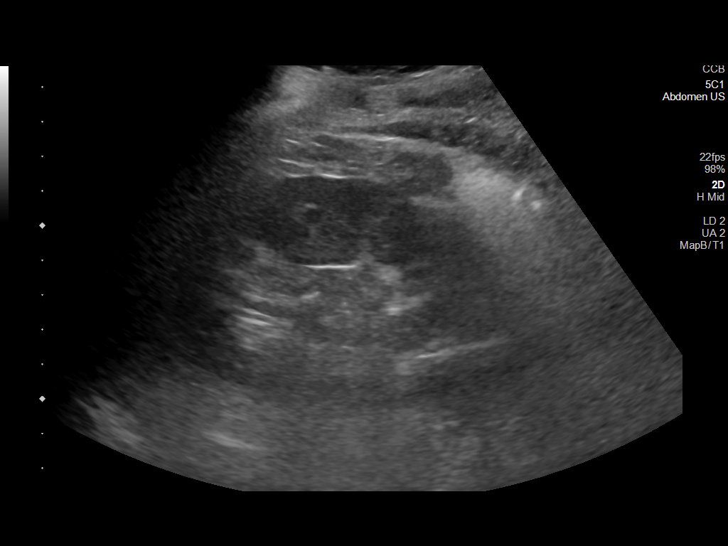
[im 93/93]
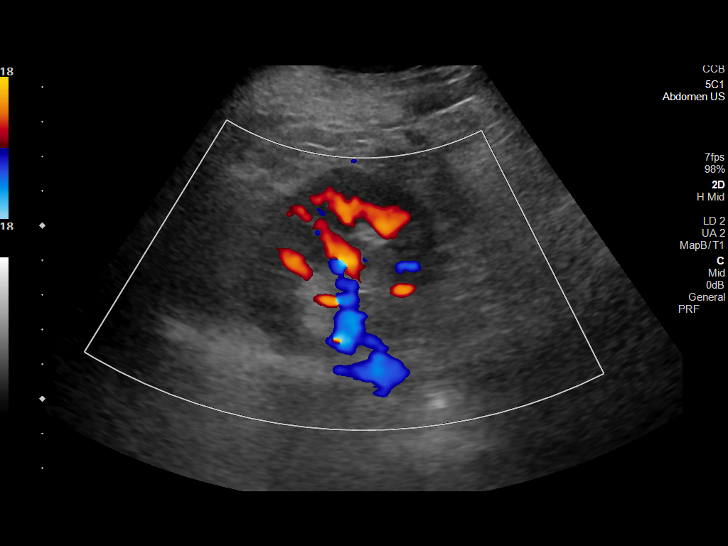

[13 of 25 positions shown; findings below may reference images not displayed]

FINDINGS: Gallbladder: Surgically absent

Common bile duct: Diameter: The intrahepatic bile duct measures
mm. A rounded region of fluid echogenicity measuring 2.6 x 1.8 cm
could represent extrahepatic dilatation of the common bile duct or a
well-circumscribed fluid collection. Fluid-filled duodenum is also
possibility.

Liver: No focal lesion identified. Within normal limits in
parenchymal echogenicity. Portal vein is patent on color Doppler
imaging with normal direction of blood flow towards the liver.

IVC: No abnormality visualized.

Pancreas: Visualized portion unremarkable.

Spleen: Size and appearance within normal limits.

Right Kidney: Length: 9.1 cm. Echogenicity within normal limits. No
mass or hydronephrosis visualized.

Left Kidney: Length: 9.9 cm. Echogenicity within normal limits. No
mass or hydronephrosis visualized.

Abdominal aorta: No aneurysm visualized.

Other findings: None.
IMPRESSION: 1. A well-circumscribed 2.6 x 1.8 cm fluid collection near the porta
hepatis could represent focally dilated common bile duct, an
indeterminate fluid collection, or even fluid-filled duodenum. An
MRCP could better evaluate whether this represents common bile duct.
2. No other abnormalities.

These results will be called to the ordering clinician or
representative by the Radiologist Assistant, and communication
documented in the PACS or [REDACTED].

## 2021-09-08 ENCOUNTER — Other Ambulatory Visit: Payer: Self-pay | Admitting: Family Medicine

## 2021-09-08 ENCOUNTER — Encounter: Payer: Self-pay | Admitting: Family Medicine

## 2021-09-08 DIAGNOSIS — R1011 Right upper quadrant pain: Secondary | ICD-10-CM

## 2021-09-08 NOTE — Progress Notes (Signed)
MRCP ordered as recommended in [place] report

## 2021-09-09 ENCOUNTER — Telehealth: Payer: Self-pay

## 2021-09-09 NOTE — Telephone Encounter (Signed)
-----   Message from Sheliah Hatch, MD sent at 09/08/2021  3:14 PM EST ----- Your ultrasound shows a collection of fluid near the bile duct.  They are not sure the origin of this fluid and are recommending an MRCP to better evaluate.  Interventional Radiology does this and I will place referral for you.

## 2021-09-09 NOTE — Telephone Encounter (Signed)
Patient is aware, I let her know that someone would be contacting her about scheduling the MRI. Let Dr Beverely Low know that a peer to peer was needed.

## 2021-09-09 NOTE — Telephone Encounter (Signed)
Peer to peer was successfully completed and order approved.  Someone will call her to schedule MRCP

## 2021-09-14 ENCOUNTER — Other Ambulatory Visit: Payer: Self-pay | Admitting: Family Medicine

## 2021-09-20 ENCOUNTER — Other Ambulatory Visit: Payer: Self-pay

## 2021-09-20 ENCOUNTER — Ambulatory Visit
Admission: RE | Admit: 2021-09-20 | Discharge: 2021-09-20 | Disposition: A | Discharge status: Home / Self Care | Payer: BC Managed Care – PPO | Source: Ambulatory Visit | Attending: Family Medicine | Admitting: Family Medicine

## 2021-09-20 DIAGNOSIS — R1011 Right upper quadrant pain: Secondary | ICD-10-CM

## 2021-09-20 IMAGING — MR MR ABDOMEN WO/W CM MRCP
12 of 20 series · 24 of 48 positions shown · IV contrast (12 ml Multihance)
Comparison: None.

CLINICAL DATA: Right upper quadrant pain. Prior cholecystectomy.
Possible cystic lesion in porta hepatis on recent ultrasound.

EXAM:
MRI ABDOMEN WITHOUT AND WITH CONTRAST (INCLUDING MRCP)
TECHNIQUE: Multiplanar multisequence MR imaging of the abdomen was performed
both before and after the administration of intravenous contrast.
Heavily T2-weighted images of the biliary and pancreatic ducts were
obtained, and three-dimensional MRCP images were rendered by post
processing.
CONTRAST:  12mL MULTIHANCE GADOBENATE DIMEGLUMINE 529 MG/ML IV SOLN

[Series 4: cor haste · coronal · 5.5mm · 0.74mm/px · 2 of 33 slices shown]
[im 1/33]
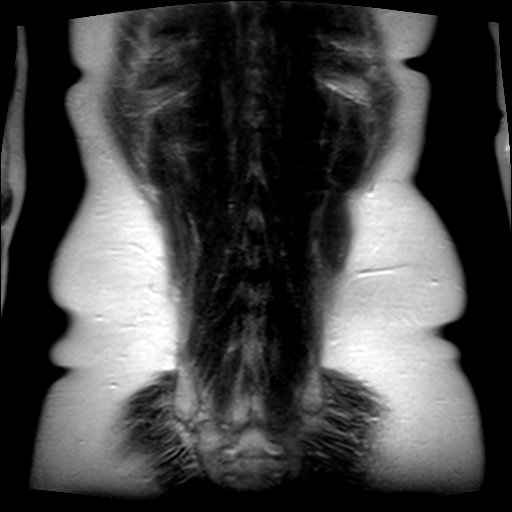
[im 33/33]
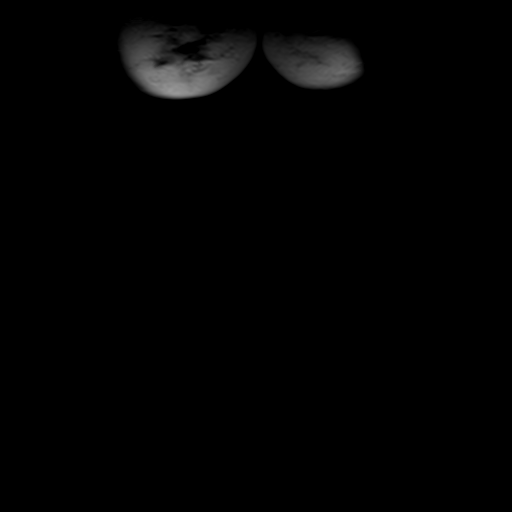

[Series 5: axial haste · axial · 6.5mm · 0.78mm/px · 1 of 39 slices shown]
[im 1/39]
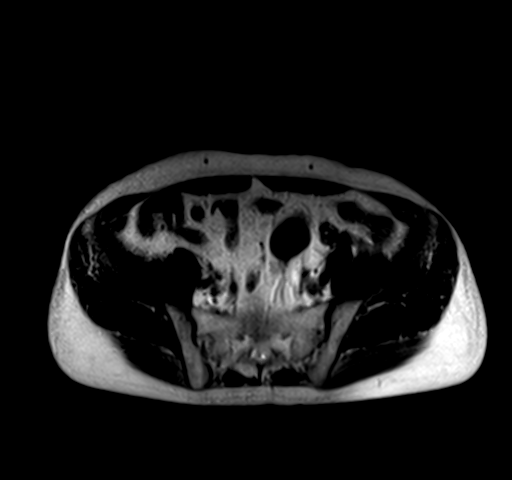

[Series 6: bSSFP · axial · 4.5mm · 0.74mm/px · z∈[-128,+142]mm · 2 of 61 slices shown (1 of 2)]
[im 1/61]
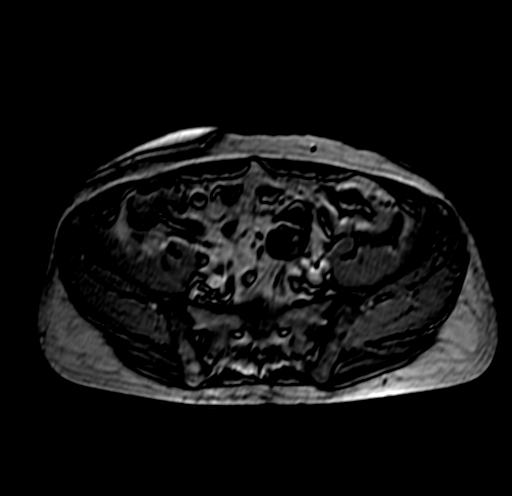
[im 61/61]
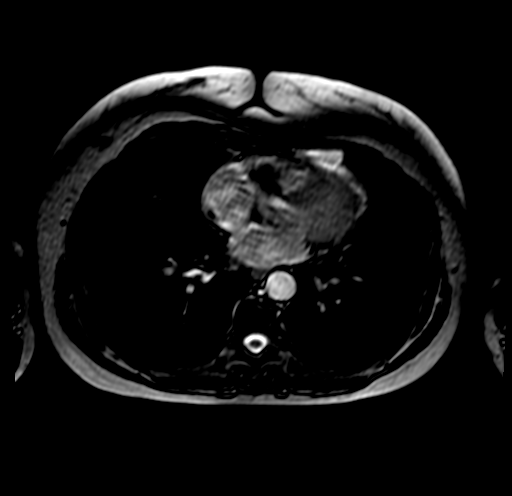

[Series 7: bSSFP · coronal · 5.5mm · 0.78mm/px · 1 of 30 slices shown (2 of 2)]
[im 1/30]
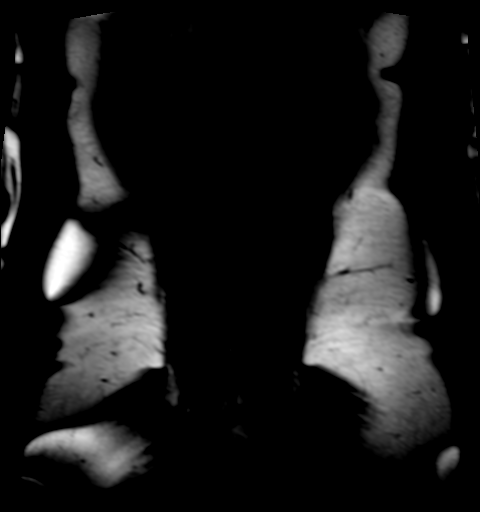

[Series 8: T2 · coronal · 3.5mm · 0.70mm/px · 2 of 54 slices shown]
[im 1/54]
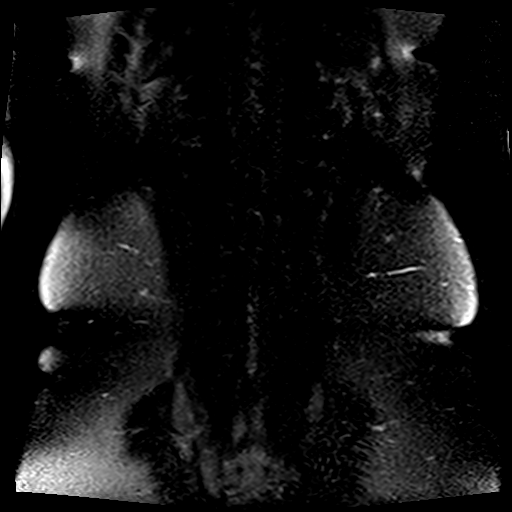
[im 54/54]
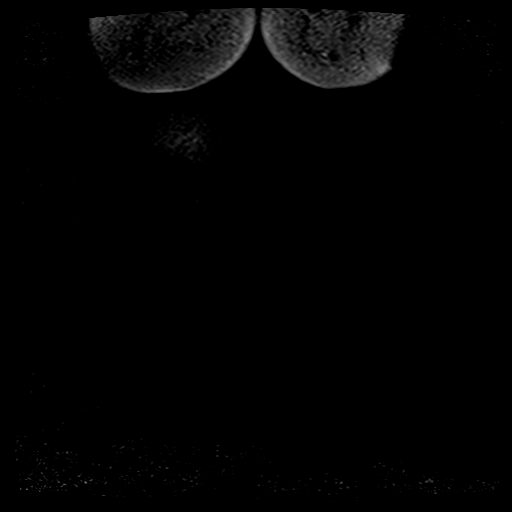

[Series 11: T2 fat-sat · axial · 6.5mm · 1.19mm/px · 1 of 39 slices shown]
[im 1/39]
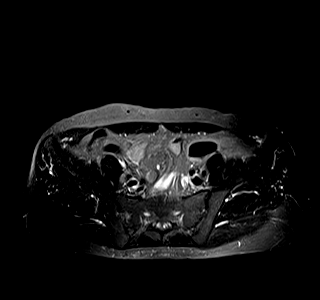

[Series 12: [id number]_[id number]_trig · axial · 6.5mm · 2.03mm/px · z∈[-129,+143]mm · 4 of 117 slices shown]
[im 1/117]
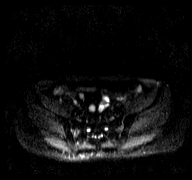
[im 39/117]
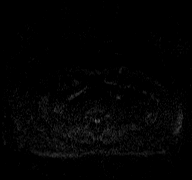
[im 78/117]
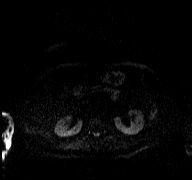
[im 117/117]
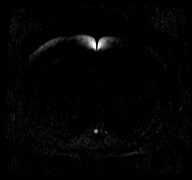

[Series 13: [id number]_trig_adc · axial · 6.5mm · 2.03mm/px · 1 of 39 slices shown]
[im 1/39]
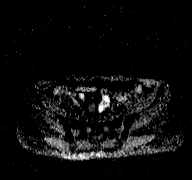

[Series 17: T1 · axial · 6.5mm · 0.74mm/px · z∈[-129,+143]mm · 3 of 78 slices shown]
[im 1/78]
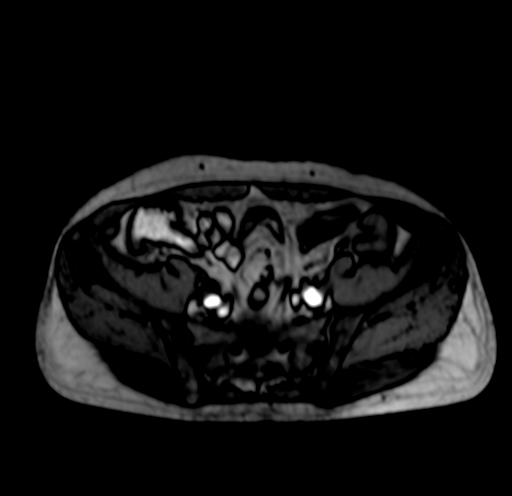
[im 39/78]
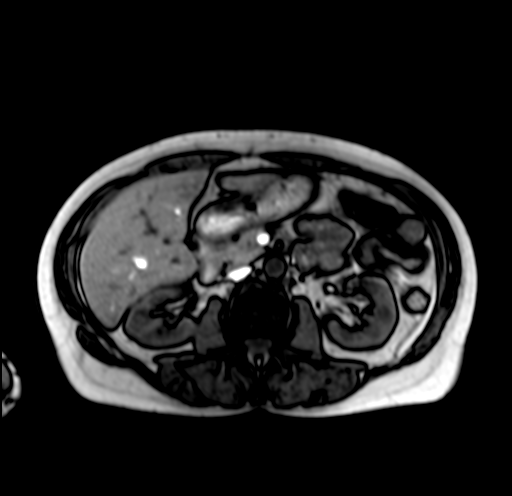
[im 78/78]
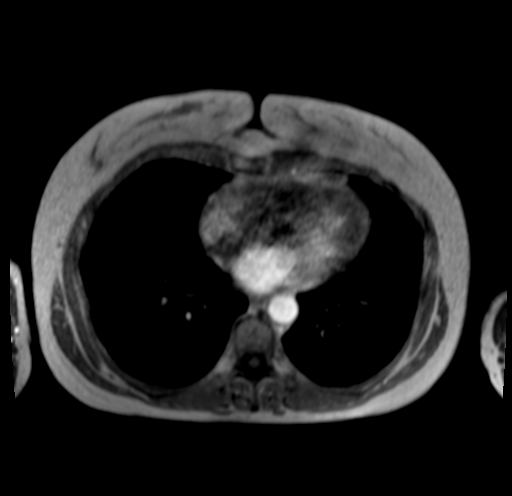

[Series 18: T1 dynamic · axial · non-contrast · 3.0mm · 0.74mm/px · z∈[-128,+133]mm · 3 of 88 slices shown]
[im 1/88]
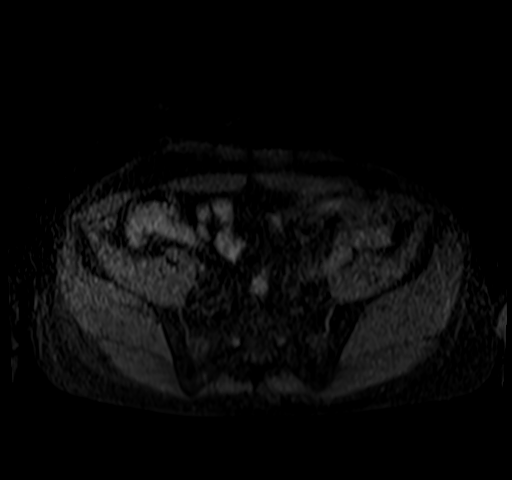
[im 44/88]
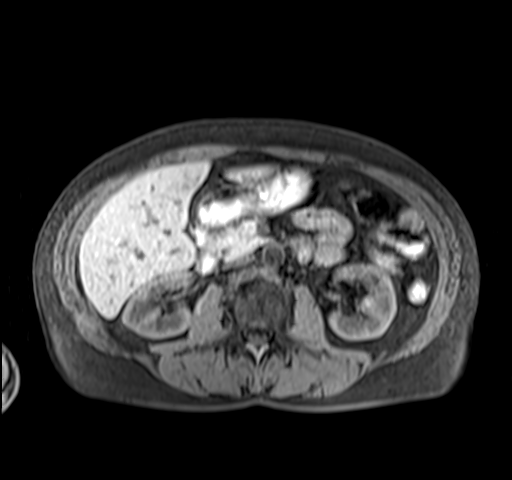
[im 88/88]
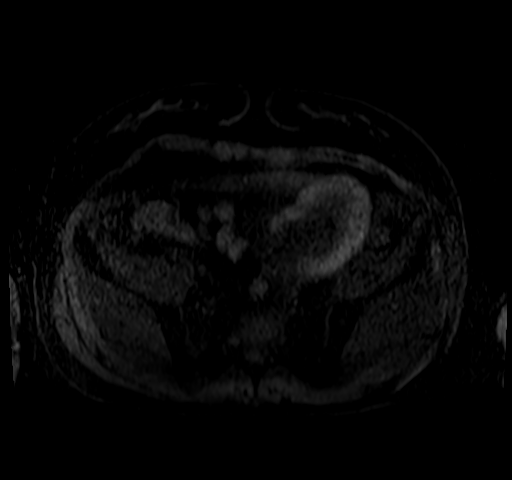

[Series 19: T1 dynamic post-contrast · axial · 3.0mm · 0.74mm/px · z∈[-128,+133]mm · 3 of 88 slices shown (1 of 2)]
[im 1/88]
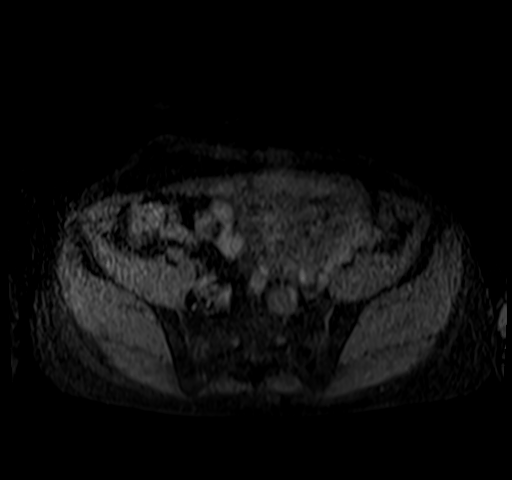
[im 44/88]
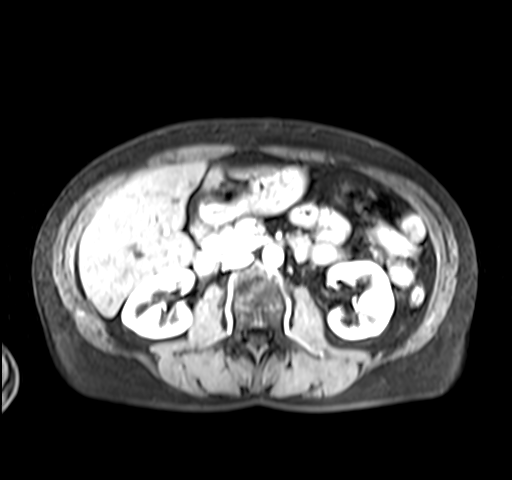
[im 88/88]
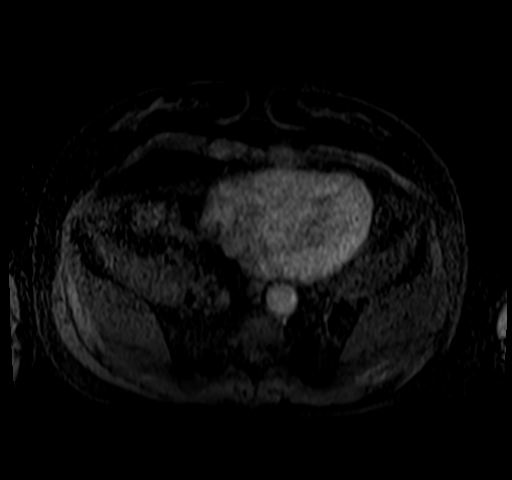

[Series 20: T1 dynamic post-contrast · axial · 3.0mm · 0.74mm/px · 1 of 88 slices shown (2 of 2)]
[im 1/88]
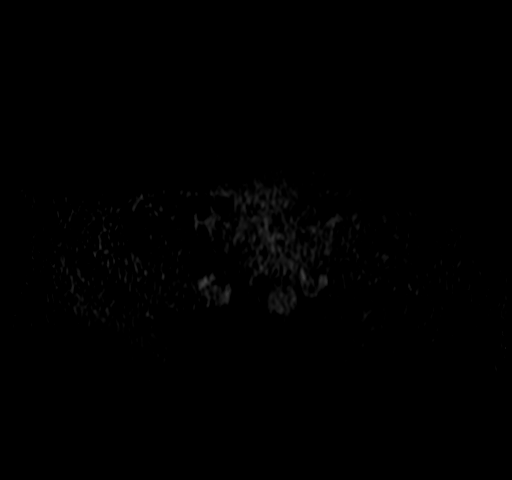

[24 of 48 positions shown; findings below may reference images not displayed]

FINDINGS: Lower chest: No acute findings.

Hepatobiliary: No hepatic masses identified. No evidence of
steatosis. Elongated right hepatic lobe, likely represents a
Riedel's lobe (normal variant). Prior cholecystectomy. No evidence
of biliary ductal dilatation or choledococele. No cystic lesion or
abnormal fluid collections seen in the porta hepatis.

Pancreas: No solid or cystic pancreatic mass identified. No evidence
of pancreatitis or peripancreatic fluid collections. No evidence of
pancreatic ductal dilatation or pancreas divisum.

Spleen:  Within normal limits in size and appearance.

Adrenals/Urinary Tract: No masses identified. No evidence of
hydronephrosis.

Stomach/Bowel: Unremarkable.

Vascular/Lymphatic: No pathologically enlarged lymph nodes
identified. No acute vascular findings.

Other:  None.

Musculoskeletal:  No suspicious bone lesions identified.
IMPRESSION: Prior cholecystectomy. No evidence of biliary ductal dilatation or
other significant abnormality.

No evidence of cystic lesion or abnormal fluid collection in the
porta hepatis.

## 2021-09-20 MED ORDER — GADOBENATE DIMEGLUMINE 529 MG/ML IV SOLN
12.0000 mL | Freq: Once | INTRAVENOUS | Status: AC | PRN
  Administered 2021-09-20: 12 mL via INTRAVENOUS

## 2021-09-22 ENCOUNTER — Encounter: Payer: Self-pay | Admitting: Family Medicine

## 2021-09-22 DIAGNOSIS — R1011 Right upper quadrant pain: Secondary | ICD-10-CM

## 2021-10-09 ENCOUNTER — Encounter: Payer: Self-pay | Admitting: Gastroenterology

## 2021-10-09 ENCOUNTER — Ambulatory Visit: Payer: BC Managed Care – PPO | Admitting: Nurse Practitioner

## 2021-10-09 ENCOUNTER — Encounter: Payer: Self-pay | Admitting: Nurse Practitioner

## 2021-10-09 VITALS — BP 114/80 | HR 60 | Ht 65.0 in | Wt 138.0 lb

## 2021-10-09 DIAGNOSIS — R1011 Right upper quadrant pain: Secondary | ICD-10-CM

## 2021-10-09 DIAGNOSIS — K219 Gastro-esophageal reflux disease without esophagitis: Secondary | ICD-10-CM | POA: Diagnosis not present

## 2021-10-09 NOTE — Patient Instructions (Signed)
You have been scheduled for an endoscopy. Please follow written instructions given to you at your visit today. If you use inhalers (even only as needed), please bring them with you on the day of your procedure.   If you are age 59 or older, your body mass index should be between 23-30. Your Body mass index is 22.96 kg/m. If this is out of the aforementioned range listed, please consider follow up with your Primary Care Provider.  If you are age 15 or younger, your body mass index should be between 19-25. Your Body mass index is 22.96 kg/m. If this is out of the aformentioned range listed, please consider follow up with your Primary Care Provider.   ________________________________________________________  The [facility] providers would like to encourage you to use [facility] to communicate with providers for non-urgent requests or questions.  Due to long hold times on the telephone, sending your provider a message by [facility] may be a faster and more efficient way to get a response.  Please allow 48 business hours for a response.  Please remember that this is for non-urgent requests.  _______________________________________________________   Due to recent changes in healthcare laws, you may see the results of your imaging and laboratory studies on MyChart before your provider has had a chance to review them.  We understand that in some cases there may be results that are confusing or concerning to you. Not all laboratory results come back in the same time frame and the provider may be waiting for multiple results in order to interpret others.  Please give [place] 48 hours in order for your provider to thoroughly review all the results before contacting the office for clarification of your results.    Thank you for choosing [facility]  Midge Minium

## 2021-10-09 NOTE — Progress Notes (Signed)
ASSESSMENT AND PLAN    # 61 yo female with progressive RUQ pain which can be triggered by eating, having a BM but also bothers her at night when laying on her back or right side. She is s/p remote cholecystectomy. Liver tests are normal. Recent US suggests a well-circumscribed 2.6 x 1.8 cm fluid collection near the porta hepatis possibly a focally dilated common bile duct, an indeterminate fluid collection, or even fluid-filled duodenum. However, MRCP two weeks later was negative. Discordant imaging results confusing and not even sure that findings on [place] would explain her symptoms anyway.  --For further evaluation of RUQ pain and ? Duodenal findings on [place] will arrange for EGD. The risks and benefits of EGD with possible biopsies were discussed with the patient who agrees to proceed.   # Chronic GERD. Overall does okay on daily Nexium. Dexilant works best for her but it isn't covered by her insurance. Sounds like she had an EGD just out of college.  --Will evaluate for Barrett's in setting of chronic GERD at time of EGD  # Colon cancer screening. Ten year screening colonoscopy due in 2016.    HISTORY OF PRESENT ILLNESS     Chief Complaint : pain in right upper abdomen  Robin Mueller is a 61 y.o. female with a past medical history significant for hemorrhoids s/p banding,  IBS, chronic GERD, migraines, depression, appendectomy, hysterectomy,  cholecystectomy. See PMH below for any additional history.   Seen by Dr. Carlean Purl in 2016 for IBS and hemorrhoids.  Underwent hemorrhoid banding in 2017-2018. Referred by PCP for evaluation of RUQ pain   INTERVAL HISTORY Here for evaluation of RUQ pain. Pain started 2 years ago, occurred intermittently at first.This past summer episodes became more frequent and lasting longer. She gets the RUQ pain now  about 4-5 times a week. The pain doesn't radiate anywhere. It occurs randomly, sometimes during a meal ,  before and during a BM and it can wake  her up at night if she lays on her back or her right side. It doesn't really bother her to bend or twist. She sometimes wakes up nauseated but doesn't vomit  She has taken Ibuprofen 800 mg about once a week for two years. Gallbladder removed > 20 years ago. Bowel movements are unchanged She still has rectal bleeding sometimes, had internal hemorrhoid banding in 2015, 2017, and 2018  PCP ordered labs. LFTs, CBC normal. An [place] which showed a well-circumscribed 2.6 x 1.8 cm fluid collection near the porta hepatis possibly a focally dilated common bile duct, an indeterminate fluid collection, or even fluid-filled duodenum. However, MRCP two weeks later was negative.   In addition to above, Robin Mueller has chronic GERD. Over the years but she been on multiple diferent medications. Dexilant worked the best but insurance does pay for it. Takes Nexium every day but gets occasional breakthrough heartburn and reflux. Had an EGD many years ago.   Lab Data Reviewed:  CBC Latest Ref Rng & Units 07/21/2021 07/16/2020 05/07/2019  WBC 4.0 - 10.5 K/uL 6.1 4.6 8.1  Hemoglobin 12.0 - 15.0 g/dL 13.8 13.9 13.4  Hematocrit 36.0 - 46.0 % 41.3 40.4 40.5  Platelets 150.0 - 400.0 K/uL 279.0 248.0 276.0    No results found for: LIPASE CMP Latest Ref Rng & Units 07/21/2021 07/16/2020 05/07/2019  Glucose 70 - 99 mg/dL 87 77 86  BUN 6 - 23 mg/dL 16 18 27(H)  Creatinine 0.40 - 1.20 mg/dL 0.88 0.86 0.91  Sodium 135 - 145 mEq/L 137 141 139  Potassium 3.5 - 5.1 mEq/L 4.2 4.2 4.4  Chloride 96 - 112 mEq/L 100 103 103  CO2 19 - 32 mEq/L 29 29 26   Calcium 8.4 - 10.5 mg/dL 9.5 9.4 9.7  Total Protein 6.0 - 8.3 g/dL 6.8 6.7 7.0  Total Bilirubin 0.2 - 1.2 mg/dL 0.7 0.6 0.6  Alkaline Phos 39 - 117 U/L 47 49 51  AST 0 - 37 U/L 29 22 30   ALT 0 - 35 U/L 26 23 32   tTg IgA normal.   Imaging  US abdomen  IMPRESSION: 1. A well-circumscribed 2.6 x 1.8 cm fluid collection near the porta hepatis could represent focally dilated common bile  duct, an indeterminate fluid collection, or even fluid-filled duodenum. An MRCP could better evaluate whether this represents common bile duct. 2. No other abnormalities.    MR ABDOMEN MRCP W WO CONTAST CLINICAL DATA:  Right upper quadrant pain. Prior cholecystectomy. Possible cystic lesion in porta hepatis on recent ultrasound.  EXAM: MRI ABDOMEN WITHOUT AND WITH CONTRAST (INCLUDING MRCP)  TECHNIQUE: Multiplanar multisequence MR imaging of the abdomen was performed both before and after the administration of intravenous contrast. Heavily T2-weighted images of the biliary and pancreatic ducts were obtained, and three-dimensional MRCP images were rendered by post processing.  CONTRAST:  10mL MULTIHANCE GADOBENATE DIMEGLUMINE 529 MG/ML IV SOLN  COMPARISON:  None.  FINDINGS: Lower chest: No acute findings.  Hepatobiliary: No hepatic masses identified. No evidence of steatosis. Elongated right hepatic lobe, likely represents a Riedel's lobe (normal variant). Prior cholecystectomy. No evidence of biliary ductal dilatation or choledococele. No cystic lesion or abnormal fluid collections seen in the porta hepatis.  Pancreas: No solid or cystic pancreatic mass identified. No evidence of pancreatitis or peripancreatic fluid collections. No evidence of pancreatic ductal dilatation or pancreas divisum.  Spleen:  Within normal limits in size and appearance.  Adrenals/Urinary Tract: No masses identified. No evidence of hydronephrosis.  Stomach/Bowel: Unremarkable.  Vascular/Lymphatic: No pathologically enlarged lymph nodes identified. No acute vascular findings.  Other:  None.  Musculoskeletal:  No suspicious bone lesions identified.  IMPRESSION: Prior cholecystectomy. No evidence of biliary ductal dilatation or other significant abnormality.  No evidence of cystic lesion or abnormal fluid collection in the porta hepatis.  Electronically Signed   By: Marlaine Hind M.D.    On: 09/20/2021 18:29  PREVIOUS GI EVALUATIONS:   Nov 2016 Screening colonoscopy  -Normal, repeat in 10 years   Past Medical History:  Diagnosis Date   Allergic rhinitis    Allergy    Anxiety    Arthritis    hands, feet,back - otc med prn   Colon polyp    Depression    Dysrhythmia    hx pvc,pac   GERD (gastroesophageal reflux disease)    IBS (irritable bowel syndrome)    diet controlled   Internal hemorrhoid    PONV (postoperative nausea and vomiting)    Rosacea      Past Surgical History:  Procedure Laterality Date   APPENDECTOMY     CARPAL TUNNEL RELEASE Bilateral    bilateral   CHOLECYSTECTOMY     COLONOSCOPY  2007   Gessner normal but hx polyps    CYST EXCISION Left    left hand and tendon release   FOOT NEUROMA SURGERY Right 2010   FOOT SURGERY Left    Debriement   HEMORRHOID BANDING  05/03/2016   MASS EXCISION Right 12/05/2014   Procedure: EXCISION MASS, index  finger;  Surgeon: Leanora Cover, MD;  Location: [facility];  Service: Orthopedics;  Laterality: Right;   NASAL SEPTUM SURGERY     right calcaneous surgery     TUMOR EXCISION Left    left lower leg skin   ulna nerve release Right 2007   ulna nerve release Left 2007   ULNAR NERVE TRANSPOSITION Right 2009   VAGINAL HYSTERECTOMY  2000   Family History  Problem Relation Age of Onset   Hypertension Mother    Diabetes Mother    Colon polyps Mother        precancerous   Heart attack Mother    Diverticulitis Mother    Breast cancer Mother    Hypertension Father    Stroke Father    Colon polyps Father    Melanoma Father    Diverticulitis Father    Hypertension Brother    Colon polyps Brother    Diabetes Brother    Melanoma Brother    Lupus Maternal Grandmother    Rheum arthritis Maternal Grandmother    Coronary artery disease Maternal Grandfather    Heart disease Maternal Grandfather    Basal cell carcinoma Maternal Grandfather    Colon cancer Neg Hx    Stomach cancer Neg Hx     Esophageal cancer Neg Hx    Rectal cancer Neg Hx    Social History   Tobacco Use   Smoking status: Former    Packs/day: 1.00    Types: Cigarettes    Quit date: 09/06/1998    Years since quitting: 23.1   Smokeless tobacco: Never  Vaping Use   Vaping Use: Never used  Substance Use Topics   Alcohol use: Yes    Alcohol/week: 14.0 standard drinks    Types: 14 Glasses of wine per week    Comment: 0-2 per day   Drug use: No   Current Outpatient Medications  Medication Sig Dispense Refill   Ascorbic Acid (VITAMIN C) 100 MG CHEW Chew by mouth daily.     b complex vitamins tablet Take 1 tablet by mouth daily.     BIOTIN PO Take by mouth daily.     Calcium 600-200 MG-UNIT per tablet Take 1 tablet by mouth daily.     esomeprazole (NEXIUM) 40 MG capsule TAKE 1 CAPSULE (40 MG TOTAL) BY MOUTH DAILY. 90 capsule 1   fish oil-omega-3 fatty acids 1000 MG capsule Take 1 g by mouth daily.     Flaxseed, Linseed, (FLAX SEED OIL) 1000 MG CAPS Take by mouth daily.     FLUoxetine (PROZAC) 10 MG capsule TAKE 1 CAPSULE BY MOUTH EVERY DAY 90 capsule 2   ibuprofen (ADVIL) 800 MG tablet TAKE 1 TABLET BY MOUTH EVERY 8 HOURS AS NEEDED 180 tablet 0   magnesium oxide (MAG-OX) 400 MG tablet Take 400 mg by mouth daily.     Polyethyl Glycol-Propyl Glycol (SYSTANE) 0.4-0.3 % SOLN Apply 1 drop to eye in the morning and at bedtime.     Turmeric 500 MG CAPS Take by mouth.     No current facility-administered medications for this visit.   Allergies  Allergen Reactions   Amoxicillin     Diarrhea    Adhesive [Tape] Rash   Latex Rash    She says she has a mild reaction to this     Review of Systems: Positive for allergy, sinus trouble, anxiety, arthritis, back pain.  All other systems reviewed and negative except where noted in HPI.    PHYSICAL  EXAM :    Wt Readings from Last 3 Encounters:  10/09/21 138 lb (62.6 kg)  07/21/21 135 lb 3.2 oz (61.3 kg)  07/01/21 135 lb (61.2 kg)    BP 114/80 (BP  Location: Left Arm, Patient Position: Sitting, Cuff Size: Normal)    Pulse 60    Ht 5\' 5"  (1.651 m) Comment: height measured without shoes   Wt 138 lb (62.6 kg)    BMI 22.96 kg/m  Constitutional:  Generally well appearing female in no acute distress. Psychiatric: Pleasant. Normal mood and affect. Behavior is normal. EENT: Pupils normal.  Conjunctivae are normal. No scleral icterus. Neck supple.  Cardiovascular: Normal rate, regular rhythm. No edema Pulmonary/chest: Effort normal and breath sounds normal. No wheezing, rales or rhonchi. Abdominal: Soft, nondistended, nontender. Bowel sounds active throughout. There are no masses palpable. No hepatomegaly. Negative Carnett's sign Neurological: Alert and oriented to person place and time. Skin: Skin is warm and dry. No rashes noted.  Tye Savoy, NP  10/09/2021, 11:41 AM  Cc:  Referring Provider Midge Minium, MD

## 2021-10-13 ENCOUNTER — Ambulatory Visit (AMBULATORY_SURGERY_CENTER): Payer: BC Managed Care – PPO | Admitting: Gastroenterology

## 2021-10-13 ENCOUNTER — Encounter: Payer: Self-pay | Admitting: Gastroenterology

## 2021-10-13 VITALS — BP 126/69 | HR 53 | Temp 98.0°F | Resp 14 | Ht 65.0 in | Wt 138.0 lb

## 2021-10-13 DIAGNOSIS — K317 Polyp of stomach and duodenum: Secondary | ICD-10-CM

## 2021-10-13 DIAGNOSIS — R1011 Right upper quadrant pain: Secondary | ICD-10-CM

## 2021-10-13 DIAGNOSIS — K3189 Other diseases of stomach and duodenum: Secondary | ICD-10-CM

## 2021-10-13 DIAGNOSIS — K219 Gastro-esophageal reflux disease without esophagitis: Secondary | ICD-10-CM

## 2021-10-13 MED ORDER — SODIUM CHLORIDE 0.9 % IV SOLN: 500.0000 mL | Freq: Once | INTRAVENOUS | Status: DC

## 2021-10-13 NOTE — Progress Notes (Signed)
Nad vss trans to pacu 

## 2021-10-13 NOTE — Op Note (Signed)
[facility] Patient Name: Robin Mueller Procedure Date: 10/13/2021 2:53 PM MRN: [id number] Endoscopist: Remo Lipps P. Havery Moros , MD Age: 61 Referring MD:  Date of Birth: 01/02/1961 Gender: Female Account #: [id number] Procedure:                Upper GI endoscopy Indications:              Abdominal pain in the right upper quadrant,                            Follow-up of gastro-esophageal reflux disease -                            post cholecystectomy, negative RUQ [place] other than                            questionable dilated CBD, clarified with MRCP which                            was normal. On chronic nexium for GERD, screen for                            Barrett's Medicines:                Monitored Anesthesia Care Procedure:                Pre-Anesthesia Assessment:                           - Prior to the procedure, a History and Physical                            was performed, and patient medications and                            allergies were reviewed. The patient's tolerance of                            previous anesthesia was also reviewed. The risks                            and benefits of the procedure and the sedation                            options and risks were discussed with the patient.                            All questions were answered, and informed consent                            was obtained. Prior Anticoagulants: The patient has                            taken no previous anticoagulant or antiplatelet  agents. ASA Grade Assessment: II - A patient with                            mild systemic disease. After reviewing the risks                            and benefits, the patient was deemed in                            satisfactory condition to undergo the procedure.                           After obtaining informed consent, the endoscope was                            passed under direct vision. Throughout  the                            procedure, the patient's blood pressure, pulse, and                            oxygen saturations were monitored continuously. The                            GIF [id number] was introduced through the                            mouth, and advanced to the second part of duodenum.                            The upper GI endoscopy was accomplished without                            difficulty. The patient tolerated the procedure                            well. Scope In: Scope Out: Findings:                 Esophagogastric landmarks were identified: the                            Z-line was found at 37 cm, the gastroesophageal                            junction was found at 37 cm and the upper extent of                            the gastric folds was found at 37 cm from the                            incisors.                           There  was a small gastric inlet patch in the                            proximal esophagus. The exam of the esophagus was                            otherwise normal. No evidence of Barrett's.                           A single 4-5 mm sessile polyp was found in the                            distal gastric body, grossly consistent with a                            benign fundic gland polyp . Biopsies were taken                            with a cold forceps for histology.                           The exam of the stomach was otherwise normal.                           Biopsies were taken with a cold forceps in the                            gastric body, at the incisura and in the gastric                            antrum for Helicobacter pylori testing.                           The duodenal bulb and second portion of the                            duodenum were normal. Complications:            No immediate complications. Estimated blood loss:                            Minimal. Estimated Blood Loss:     Estimated blood  loss was minimal. Impression:               - Esophagogastric landmarks identified.                           - Benign small gastric inlet patch in the proximal                            esophagus                           - Normal esophagus otherwise                           -  A single gastric polyp, suspect benign fundic                            gland polyp. Biopsied.                           - Normal stomach otherwise. Biopsies taken to rule                            out H pylori                           - Normal duodenal bulb and second portion of the                            duodenum.                           No overt cause for pain on this exam, will await                            biopsy results. Unclear if this could be                            musculoskeletal in etiology? Recommendation:           - Patient has a contact number available for                            emergencies. The signs and symptoms of potential                            delayed complications were discussed with the                            patient. Return to normal activities tomorrow.                            Written discharge instructions were provided to the                            patient.                           - Resume previous diet.                           - Continue present medications.                           - Await pathology results.                           - Will relay findings to primary GI MD, Dr.                            Carlean Purl, will discuss  options moving forward [employer]. Havery Moros, MD 10/13/2021 3:26:31 PM This report has been signed electronically.

## 2021-10-13 NOTE — Patient Instructions (Signed)
Resume previous diet and medications. Awaiting pathology results. Will relay information to Dr. Leone Payor to discuss options moving forward.  YOU HAD AN ENDOSCOPIC PROCEDURE TODAY AT THE [facility]:   Refer to the procedure report that was given to you for any specific questions about what was found during the examination.  If the procedure report does not answer your questions, please call your gastroenterologist to clarify.  If you requested that your care partner not be given the details of your procedure findings, then the procedure report has been included in a sealed envelope for you to review at your convenience later.  YOU SHOULD EXPECT: Some feelings of bloating in the abdomen. Passage of more gas than usual.  Walking can help get rid of the air that was put into your GI tract during the procedure and reduce the bloating. If you had a lower endoscopy (such as a colonoscopy or flexible sigmoidoscopy) you may notice spotting of blood in your stool or on the toilet paper. If you underwent a bowel prep for your procedure, you may not have a normal bowel movement for a few days.  Please Note:  You might notice some irritation and congestion in your nose or some drainage.  This is from the oxygen used during your procedure.  There is no need for concern and it should clear up in a day or so.  SYMPTOMS TO REPORT IMMEDIATELY:  Following upper endoscopy (EGD)  Vomiting of blood or coffee ground material  New chest pain or pain under the shoulder blades  Painful or persistently difficult swallowing  New shortness of breath  Fever of 100F or higher  Black, tarry-looking stools  For urgent or emergent issues, a gastroenterologist can be reached at any hour by calling [phone]. Do not use MyChart messaging for urgent concerns.    DIET:  We do recommend a small meal at first, but then you may proceed to your regular diet.  Drink plenty of fluids but you should avoid alcoholic  beverages for 24 hours.  ACTIVITY:  You should plan to take it easy for the rest of today and you should NOT DRIVE or use heavy machinery until tomorrow (because of the sedation medicines used during the test).    FOLLOW UP: Our staff will call the number listed on your records 48-72 hours following your procedure to check on you and address any questions or concerns that you may have regarding the information given to you following your procedure. If we do not reach you, we will leave a message.  We will attempt to reach you two times.  During this call, we will ask if you have developed any symptoms of COVID 19. If you develop any symptoms (ie: fever, flu-like symptoms, shortness of breath, cough etc.) before then, please call [phone].  If you test positive for Covid 19 in the 2 weeks post procedure, please call and report this information to [place].    If any biopsies were taken you will be contacted by phone or by letter within the next 1-3 weeks.  Please call us at [phone] if you have not heard about the biopsies in 3 weeks.    SIGNATURES/CONFIDENTIALITY: You and/or your care partner have signed paperwork which will be entered into your electronic medical record.  These signatures attest to the fact that that the information above on your After Visit Summary has been reviewed and is understood.  Full responsibility of the confidentiality of this discharge information lies  with you and/or your care-partner.

## 2021-10-13 NOTE — Progress Notes (Signed)
History and Physical Interval Note: Last seen on 10/09/21 in the office. No interval changes. Patient of Dr. Leone Payor, history of intermittent RUQ pain, some prandial association. Negative RUQ and MRCP, s/p cholecystectomy. HIstory of reflux fairly well controlled with nexium, here for EGD to further evaluate RUQ pain and will also screen for BE. NO interval changes. Discussed risks / benefits and she wishes to proceed.    10/13/2021 3:03 PM  Robin Mueller  has presented today for endoscopic procedure(s), with the diagnosis of  Encounter Diagnoses  Name Primary?   RUQ pain Yes   Gastroesophageal reflux disease without esophagitis   .  The various methods of evaluation and treatment have been discussed with the patient and/or family. After consideration of risks, benefits and other options for treatment, the patient has consented to  the endoscopic procedure(s).   The patient's history has been reviewed, patient examined, no change in status, stable for surgery.  I have reviewed the patient's chart and labs.  Questions were answered to the patient's satisfaction.    Harlin Rain, MD [facility]

## 2021-10-13 NOTE — Progress Notes (Signed)
CNW - VS   

## 2021-10-15 ENCOUNTER — Telehealth: Payer: Self-pay

## 2021-10-15 NOTE — Telephone Encounter (Signed)
°  Follow up Call-  Call back number 10/13/2021  Post procedure Call Back phone  # #[phone] cell  Permission to leave phone message Yes  Some recent data might be hidden     Patient questions:  Do you have a fever, pain , or abdominal swelling? No. Pain Score  0 *  Have you tolerated food without any problems? Yes.    Have you been able to return to your normal activities? Yes.    Do you have any questions about your discharge instructions: Diet   No. Medications  No. Follow up visit  No.  Do you have questions or concerns about your Care? No.  Actions: * If pain score is 4 or above: No action needed, pain <4.  Have you developed a fever since your procedure? no  2.   Have you had an respiratory symptoms (SOB or cough) since your procedure? no  3.   Have you tested positive for COVID 19 since your procedure no  4.   Have you had any family members/close contacts diagnosed with the COVID 19 since your procedure?  no   If yes to any of these questions please route to Laverna Peace, RN and Karlton Lemon, RN

## 2021-10-19 ENCOUNTER — Other Ambulatory Visit: Payer: Self-pay

## 2021-10-19 ENCOUNTER — Telehealth: Payer: Self-pay

## 2021-10-19 MED ORDER — DICYCLOMINE HCL 20 MG PO TABS: 20.0000 mg | ORAL_TABLET | Freq: Three times a day (TID) | ORAL | 0 refills | Status: DC

## 2021-10-19 NOTE — Telephone Encounter (Signed)
-----   Message from Yetta Flock, MD sent at 10/16/2021  6:17 PM EST ----- Robin Mueller wanted to relay the results of this patient EGD to you, I think you are her primary MD.  She has had a work-up for persistent abdominal pain, ultrasound questioned an abnormality of the bile duct but MRCP looks okay.  EGD showed no cause for her abdominal pain.  I think this may be musculoskeletal in etiology but may need needs clinic follow-up for reassessment and further exam, I never saw her in the office.  [name] can you touch base with this patient to see how she is feeling?  Please let her know that EGD showed no evidence of H. pylori and benign polyp in her stomach.  No evidence of Barrett's.

## 2021-10-19 NOTE — Telephone Encounter (Signed)
Left message for patient to please call back. 

## 2021-10-19 NOTE — Telephone Encounter (Signed)
-----   Message from Iva Boop, MD sent at 10/19/2021  4:05 PM EST ----- Okay. Explained to her that we have not found anything bad causing the pain but I want to try to help her feel better.  It is possible that she is having spasms of the stomach muscles and intestinal muscles so I want to try dicyclomine 20 mg before meals #90 no refill  She can set a follow-up appointment with me or Gunnar Fusi in about a month  Thanks

## 2021-10-20 ENCOUNTER — Telehealth: Payer: Self-pay | Admitting: Internal Medicine

## 2021-10-20 NOTE — Telephone Encounter (Signed)
Left message for pt to call back  °

## 2021-10-20 NOTE — Telephone Encounter (Signed)
Patient returned your call, please advise. 

## 2021-10-20 NOTE — Telephone Encounter (Signed)
Pt made aware of Dr. Leone Payor recommendations: Prescription sent to pharmacy: Pt made aware Pt scheduled to see Dr Leone Payor on 11/16/2021 at 10:50: Pt made aware Pt verbalized understanding with all questions answered.

## 2021-11-12 ENCOUNTER — Other Ambulatory Visit: Payer: Self-pay | Admitting: Internal Medicine

## 2021-11-16 ENCOUNTER — Ambulatory Visit: Payer: BC Managed Care – PPO | Admitting: Internal Medicine

## 2021-11-17 LAB — HM MAMMOGRAPHY

## 2021-11-24 LAB — HM MAMMOGRAPHY

## 2021-12-07 ENCOUNTER — Telehealth: Payer: BC Managed Care – PPO | Admitting: Family Medicine

## 2021-12-07 DIAGNOSIS — B9689 Other specified bacterial agents as the cause of diseases classified elsewhere: Secondary | ICD-10-CM

## 2021-12-07 DIAGNOSIS — J019 Acute sinusitis, unspecified: Secondary | ICD-10-CM

## 2021-12-07 MED ORDER — DOXYCYCLINE HYCLATE 100 MG PO TABS: 100.0000 mg | ORAL_TABLET | Freq: Two times a day (BID) | ORAL | 0 refills | Status: AC

## 2021-12-07 NOTE — Progress Notes (Signed)

## 2021-12-29 NOTE — Progress Notes (Signed)
? ?Virtual Visit via Video Note ?The purpose of this virtual visit is to provide medical care while limiting exposure to the novel coronavirus.   ? ?Consent was obtained for video visit:  Yes.   ?Answered questions that patient had about telehealth interaction:  Yes.   ?I discussed the limitations, risks, security and privacy concerns of performing an evaluation and management service by telemedicine. I also discussed with the patient that there may be a patient responsible charge related to this service. The patient expressed understanding and agreed to proceed. ? ?Pt location: Home ?Physician Location: office ?Name of referring provider:  Sheliah Hatch, MD ?I connected with Francella Solian at patients initiation/request on 12/30/2021 at  8:30 AM EDT by video enabled telemedicine application and verified that I am speaking with the correct person using two identifiers. ?Pt MRN:  [id number] ?Pt DOB:  1960/10/11 ?Video Participants:  GWYNEVERE LIZANA ? ?Assessment and Plan:   ?Migraine without aura vs tension-type headache, improved ?Left ocular pain ?  ?Migraine prevention:  start topiramate 25mg  at bedtime for one week, then increase to 50mg  at bedtime ?Migraine rescue:  She will stop sumatriptan and instead try rizatriptan 10mg  ?Limit use of pain relievers to no more than 2 days out of week to prevent risk of rebound or medication-overuse headache. ?Keep headache diary ?For further evaluation of ocular pain will check MRI of orbits with and without contrast ?Follow up 4-5 months ?  ? ?History of Present Illness:  ?Robin Mueller is a 61 year old right-handed female who follows up for migraines. ?  ?UPDATE: ?Reports increased headaches possibly due to stress or seasonal allergies. ?Intensity:  mild to moderate ?Duration:  off and on throughout the day.  Sumatriptan doesn't last. ?Frequency:  now almost daily. ?Frequency of abortive medication: ibuprofen at least 1 800mg  5 days a week. ?Current  NSAIDS/analgesics:  Tylenol (variable efficacy), ibuprofen 800mg  (variable efficacy) ?Current triptans:  sumatriptan 100mg  ?Current ergotamine:  none ?Current anti-emetic:  none ?Current muscle relaxants:  none ?Current Antihypertensive medications:  none ?Current Antidepressant medications:  Prozac ?Current Anticonvulsant medications:  none ?Current anti-CGRP:  none ?Current Vitamins/Herbal/Supplements:  magnesium oxide 400mg , CoQ10 300mg  daily, B complex ?Current Antihistamines/Decongestants:  Azelastine-Fluticasone NS - uses daily ?Other therapy:  none ?Hormone/birth control:  none ?  ?Caffeine:  Decaf coffee.  ?Diet:  Hydrates.  Does not skip meals ?Exercise:  Works out daily ?Depression:  no; Anxiety:  A little bit once in awhile ?Other pain:  no ?Sleep hygiene:  ok ? ?For the past several weeks, she has noticed a discomfort in her left eye.  When she closes her eyes or if she looks down, she feels a pressure and tingling above the left eye that radiates into her eyelid.  No associated visual disturbance.  It lasts as long as she is looking down or keeps her eyes closed.  It has not worsened over the past several weeks.  She is not sure what has caused it.  She thought it may have been due to wearing a sleeping mask to prevent dry eye, but she stopped using it weeks ago.   ? ?  ?HISTORY: ?Onset:  At least a year (early 2021).  ?Location:  Over left eye ?Quality:  Pressure sometimes stabbing ?Initial Intensity:  Normally mild- moderate, infrequently severe.  She denies new headache, thunderclap headache or severe headache that wakes her from sleep. ?Aura:  no ?Prodrome:  no ?Postdrome:  no ?Associated symptoms:  Sometimes photophobia.  She denies associated nausea, vomiting, phonophobia, osmophobia, visual disturbance, autonomic symptoms, unilateral numbness or weakness.  She does report congestion related to sinuses, not the headache. ?Initial Duration:  All day, sometimes off and on ?Initial Frequency:  4 days  a week ?Frequency of abortive medication: 2 to 3 days a week ?Triggers:  Change in weather, perfume, sometimes her sinus NS triggers the headache ?Relieving factors:  Nothing else ?Activity:  Able to function recently with the headache, however it is difficult to complete work ?  ?She reports history of chronic sinusitis and therefore reports history of sinus headache.  These current headaches are somewhat similar but they have not resolved.  However she currently denies tenderness to palpation of her sinuses.  CT sinuses from February 2019 were clear other than minimal mucosal thickening along the ethmoid and maxillary sinuses. ?  ?  ?Past NSAIDS/analgesics:  none ?Past abortive triptans:  none ?Past abortive ergotamine:  none ?Past muscle relaxants:  Flexeril ?Past anti-emetic:  none ?Past antihypertensive medications:  none ?Past antidepressant medications:  none ?Past anticonvulsant medications:  none ?Past anti-CGRP:  none ?Past vitamins/Herbal/Supplements:  none ?Past antihistamines/decongestants:  Flonase ?Other past therapies:  none ?  ?  ?Family history of headache:  Maternal grandmother (migraines), mother (headaches infrequently) ? ?Past Medical History: ?Past Medical History:  ?Diagnosis Date  ? Allergic rhinitis   ? Allergy   ? Anxiety   ? Arthritis   ? hands, feet,back - otc med prn  ? Asthma   ? Colon polyp   ? Dysrhythmia   ? hx pvc,pac  ? GERD (gastroesophageal reflux disease)   ? Hyperlipidemia   ? IBS (irritable bowel syndrome)   ? diet controlled  ? Internal hemorrhoid   ? PONV (postoperative nausea and vomiting)   ? Rosacea   ? ? ?Medications: ?Outpatient Encounter Medications as of 12/30/2021  ?Medication Sig Note  ? Ascorbic Acid (VITAMIN C) 100 MG CHEW Chew by mouth daily.   ? b complex vitamins tablet Take 1 tablet by mouth daily.   ? BIOTIN PO Take by mouth daily.   ? Calcium 600-200 MG-UNIT per tablet Take 1 tablet by mouth daily.   ? dicyclomine (BENTYL) 20 MG tablet TAKE 1 TABLET (20 MG  TOTAL) BY MOUTH 3 (THREE) TIMES DAILY BEFORE MEALS.   ? esomeprazole (NEXIUM) 40 MG capsule TAKE 1 CAPSULE (40 MG TOTAL) BY MOUTH DAILY.   ? fish oil-omega-3 fatty acids 1000 MG capsule Take 1 g by mouth daily.   ? Flaxseed, Linseed, (FLAX SEED OIL) 1000 MG CAPS Take by mouth daily.   ? FLUoxetine (PROZAC) 10 MG capsule TAKE 1 CAPSULE BY MOUTH EVERY DAY   ? ibuprofen (ADVIL) 800 MG tablet TAKE 1 TABLET BY MOUTH EVERY 8 HOURS AS NEEDED 06/12/2021: PRN  ? magnesium oxide (MAG-OX) 400 MG tablet Take 400 mg by mouth daily.   ? Polyethyl Glycol-Propyl Glycol (SYSTANE) 0.4-0.3 % SOLN Apply 1 drop to eye in the morning and at bedtime.   ? Turmeric 500 MG CAPS Take by mouth.   ? ?No facility-administered encounter medications on file as of 12/30/2021.  ? ? ?Allergies: ?Allergies  ?Allergen Reactions  ? Amoxicillin   ?  Diarrhea ?  ? Adhesive [Tape] Rash  ? Latex Rash  ?  She says she has a mild reaction to this  ? ? ?Family History: ?Family History  ?Problem Relation Age of Onset  ? Hypertension Mother   ? Diabetes Mother   ? Colon polyps  Mother   ?     precancerous  ? Heart attack Mother   ? Diverticulitis Mother   ? Breast cancer Mother   ? Hypertension Father   ? Stroke Father   ? Colon polyps Father   ? Melanoma Father   ? Diverticulitis Father   ? Hypertension Brother   ? Colon polyps Brother   ? Diabetes Brother   ? Melanoma Brother   ? Lupus Maternal Grandmother   ? Rheum arthritis Maternal Grandmother   ? Coronary artery disease Maternal Grandfather   ? Heart disease Maternal Grandfather   ? Basal cell carcinoma Maternal Grandfather   ? Colon cancer Neg Hx   ? Stomach cancer Neg Hx   ? Esophageal cancer Neg Hx   ? Rectal cancer Neg Hx   ? ? ?Observations/Objective:   ?No acute distress.  Alert and oriented.  Speech fluent and not dysarthric.  Language intact.  ? ? ?Follow Up Instructions: ?  ? -I discussed the assessment and treatment plan with the patient. The patient was provided an opportunity to ask questions and  all were answered. The patient agreed with the plan and demonstrated an understanding of the instructions. ?  ?The patient was advised to call back or seek an in-person evaluation if the symptoms worsen o

## 2021-12-30 ENCOUNTER — Encounter: Payer: Self-pay | Admitting: Neurology

## 2021-12-30 ENCOUNTER — Telehealth: Payer: BC Managed Care – PPO | Admitting: Neurology

## 2021-12-30 VITALS — Ht 66.0 in | Wt 132.0 lb

## 2021-12-30 DIAGNOSIS — H5712 Ocular pain, left eye: Secondary | ICD-10-CM

## 2021-12-30 DIAGNOSIS — G43009 Migraine without aura, not intractable, without status migrainosus: Secondary | ICD-10-CM | POA: Diagnosis not present

## 2021-12-30 MED ORDER — RIZATRIPTAN BENZOATE 10 MG PO TABS: 10.0000 mg | ORAL_TABLET | ORAL | 5 refills | Status: DC | PRN

## 2021-12-30 MED ORDER — TOPIRAMATE 50 MG PO TABS: ORAL_TABLET | ORAL | 0 refills | Status: DC

## 2021-12-30 NOTE — Patient Instructions (Signed)
start topiramate 25mg  at bedtime for one week, then increase to 50mg  at bedtime ?At earliest onset of migraine, take rizatriptan 10mg .  May repeat after 2 hours if needed.  Maximum 2 tablets in 24 hours. ?Limit use of pain relievers to no more than 2 days out of week to prevent risk of rebound or medication-overuse headache. ?Will check MRI of orbits with and without contrast. ?Follow up 4-5 months. ?

## 2021-12-30 NOTE — Addendum Note (Signed)
Addended by: Karl Luke A on: 12/30/2021 08:59 AM ? ? Modules accepted: Orders ? ?

## 2022-01-04 ENCOUNTER — Other Ambulatory Visit: Payer: Self-pay | Admitting: Neurology

## 2022-01-04 DIAGNOSIS — H5712 Ocular pain, left eye: Secondary | ICD-10-CM

## 2022-01-08 ENCOUNTER — Encounter: Payer: Self-pay | Admitting: Family Medicine

## 2022-01-18 ENCOUNTER — Other Ambulatory Visit: Payer: Self-pay | Admitting: Neurology

## 2022-01-18 ENCOUNTER — Ambulatory Visit
Admission: RE | Admit: 2022-01-18 | Discharge: 2022-01-18 | Disposition: A | Discharge status: Home / Self Care | Payer: BC Managed Care – PPO | Source: Ambulatory Visit | Attending: Neurology | Admitting: Neurology

## 2022-01-18 DIAGNOSIS — H5712 Ocular pain, left eye: Secondary | ICD-10-CM

## 2022-01-18 IMAGING — MR MR ORBITS W/O CM
4 of 5 series · 19 of 48 positions shown · non-contrast
Comparison: Maxillofacial CT [DATE].

CLINICAL DATA: Provided history: Ocular pain, left eye. Left ocular
pain. Additional history provided by scanning technologist: Patient
reports left eye pain and pressure, pain above left orbit for 2-3
months.

EXAM:
MRI OF THE ORBITS WITHOUT CONTRAST
TECHNIQUE: Multiplanar, multi-echo pulse sequences of the orbits and
surrounding structures were acquired including fat saturation
techniques, without intravenous contrast administration.

[Series 2: T1 · sagittal · 5.0mm · 0.45mm/px · 3 of 21 slices shown (1 of 2)]
[im 3/21]
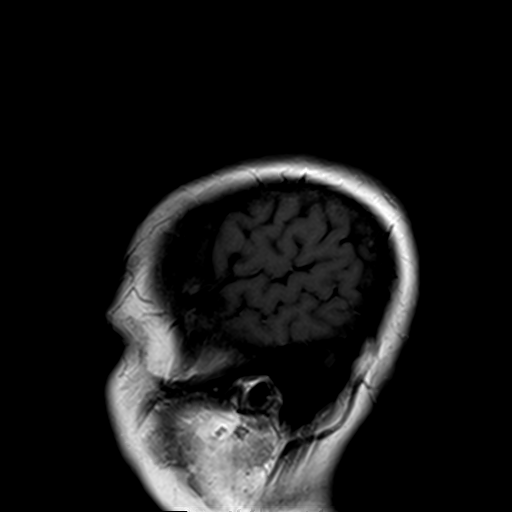
[im 12/21]
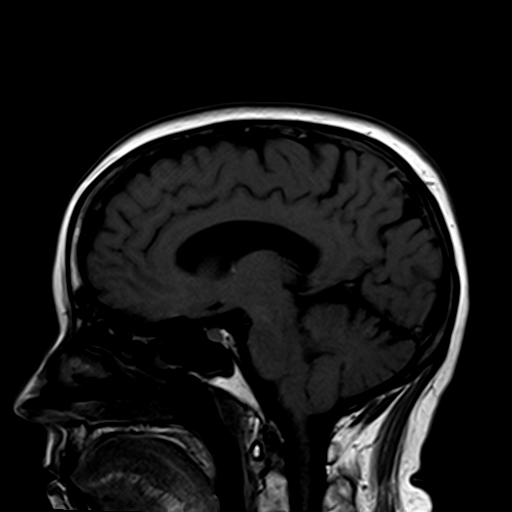
[im 18/21]
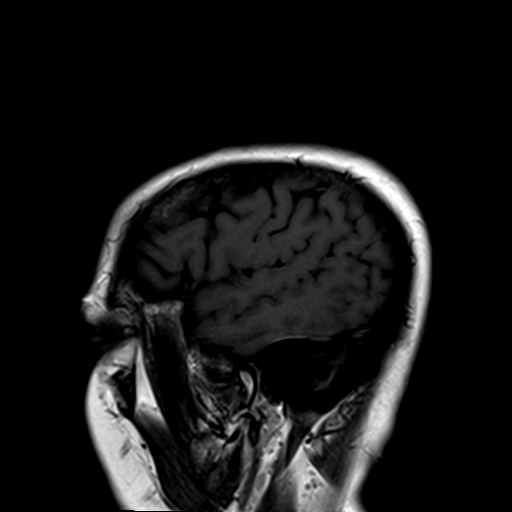

[Series 3: T1 · coronal · 3.0mm · 0.35mm/px · 3 of 27 slices shown (2 of 2)]
[im 5/27]
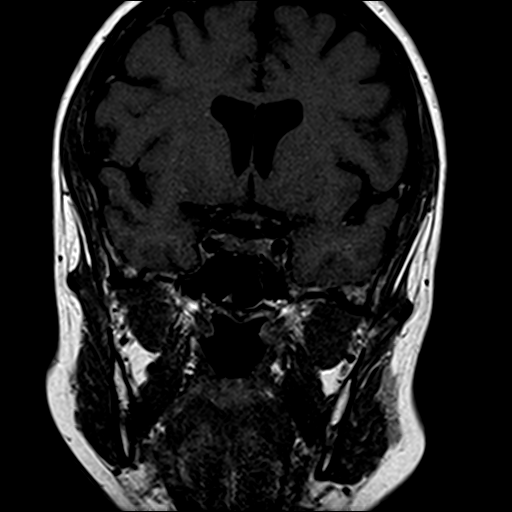
[im 15/27]
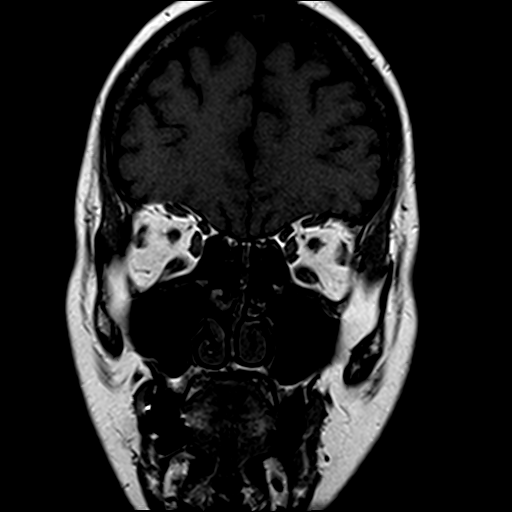
[im 24/27]
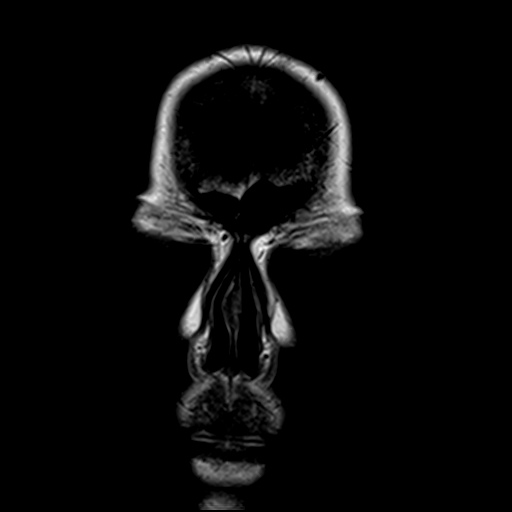

[Series 5: T2 fat-sat · coronal · 3.0mm · 0.35mm/px · 9 of 28 slices shown (1 of 2)]
[im 1/28]
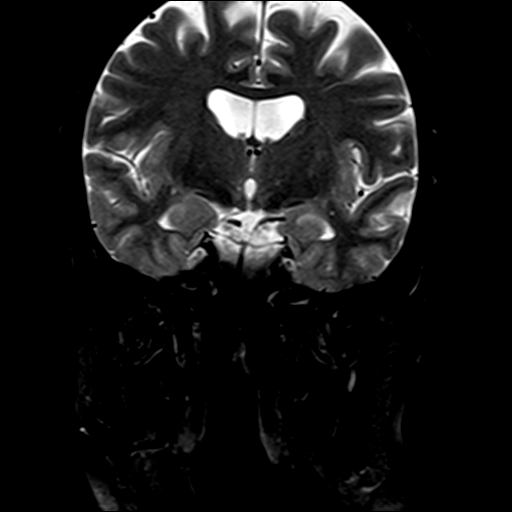
[im 5/28]
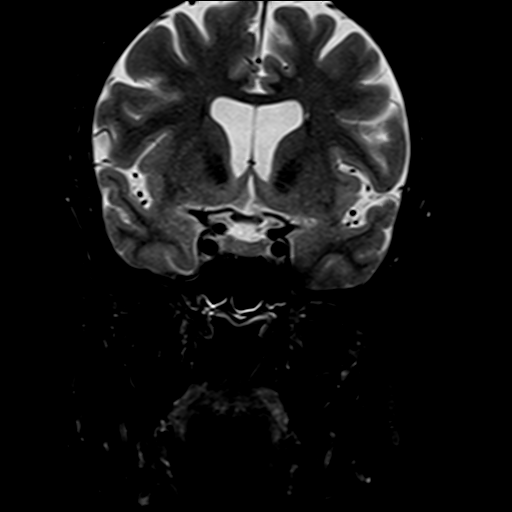
[im 8/28]
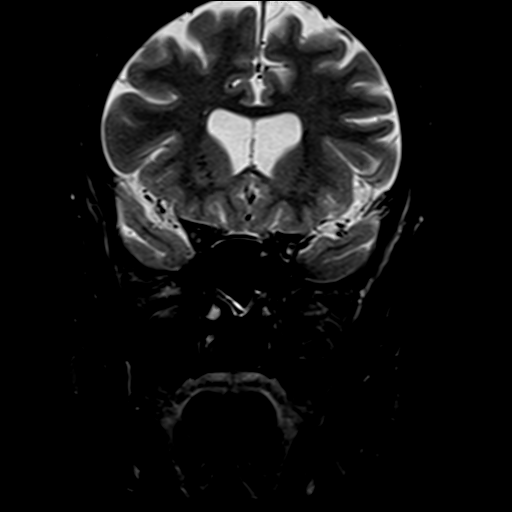
[im 13/28]
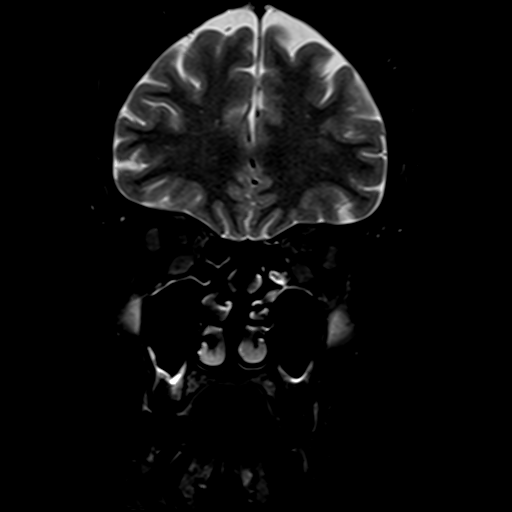
[im 15/28]
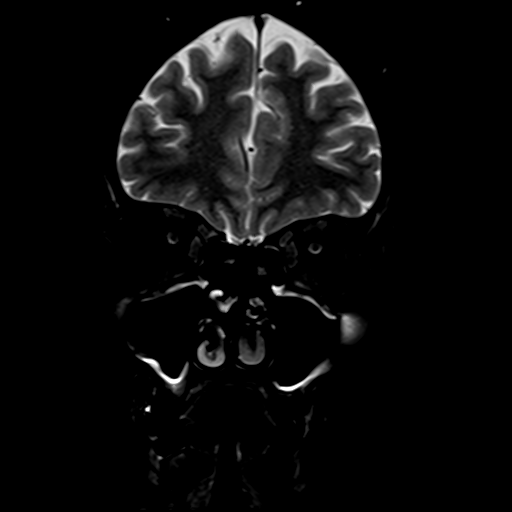
[im 20/28]
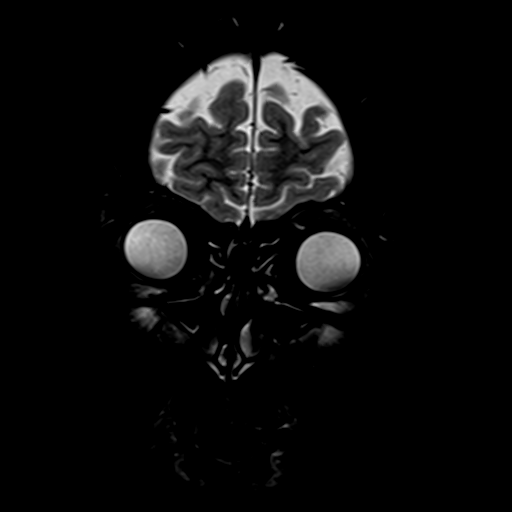
[im 23/28]
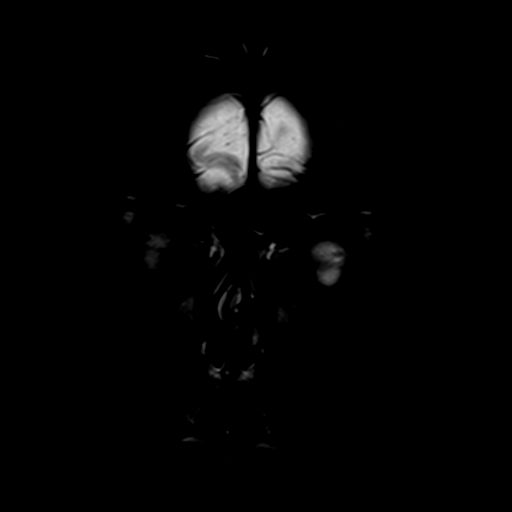
[im 25/28]
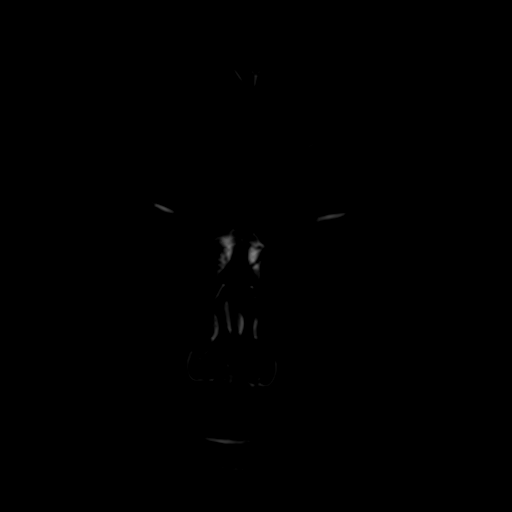
[im 28/28]
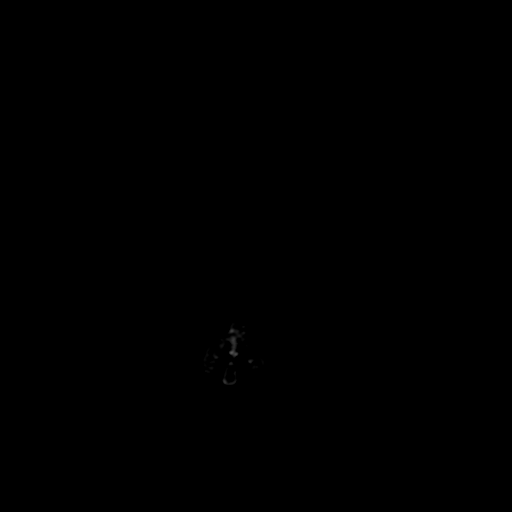

[Series 6: T2 fat-sat · axial · 3.0mm · 0.35mm/px · z∈[-34,+11]mm · 4 of 18 slices shown (2 of 2)]
[im 1/18]
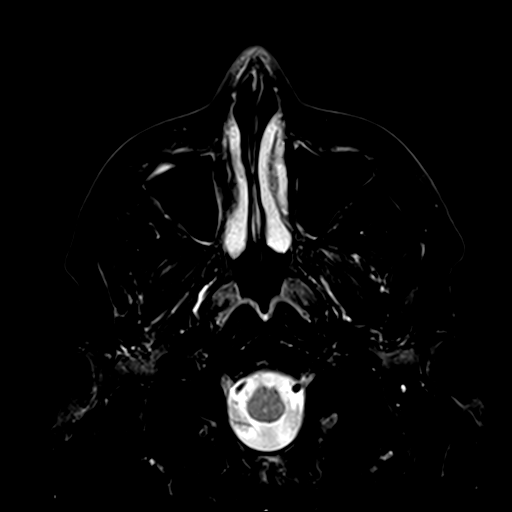
[im 3/18]
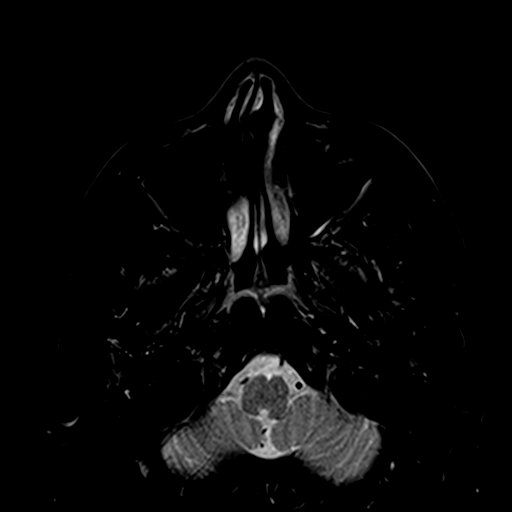
[im 10/18]
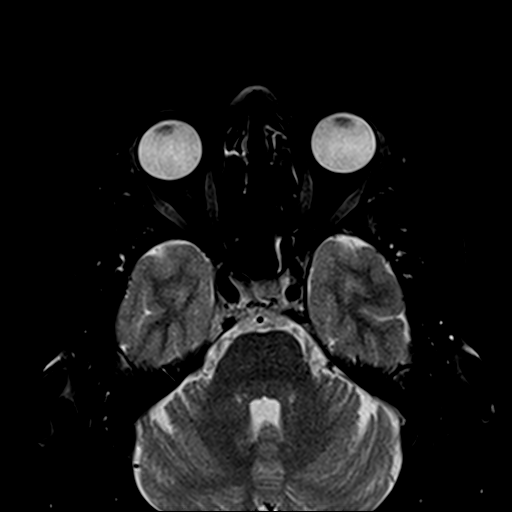
[im 15/18]
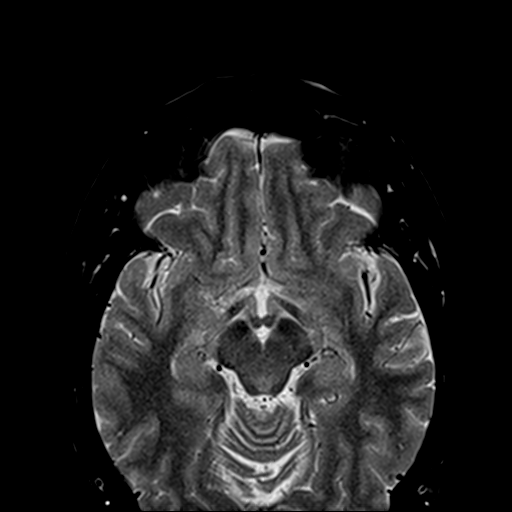

[19 of 48 positions shown; findings below may reference images not displayed]

FINDINGS: The patient refused intravenous contrast, and a non-contrast
examination was performed.

Orbits: The globes are normal in size and contour. The extraocular
muscles, optic nerve sheath complexes and lacrimal glands are
symmetric and unremarkable. No evidence of orbital inflammation or
an intraorbital mass.

Visualized sinuses: Prior bilateral ethmoidectomies. Bilateral
antrostomies. Mild mucosal thickening within the bilateral ethmoid
air cells/ethmoidectomy cavities and maxillary sinuses. Small fluid
level within the right sphenoid sinus. Mild mucosal thickening
within the left sphenoid sinus.

Soft tissues: No abnormality of the maxillofacial or upper neck soft
tissues at the imaged levels.

Limited intracranial: Trace fluid within the bilateral mastoid air
cells.
IMPRESSION: The patient refused intravenous contrast, and a non-contrast
examination was performed.

Unremarkable non-contrast MRI appearance of the orbits.

Postoperative changes to the paranasal sinuses and mild paranasal
sinus disease, as described.

Trace fluid within the bilateral mastoid air cells.

## 2022-01-23 ENCOUNTER — Other Ambulatory Visit: Payer: Self-pay | Admitting: Neurology

## 2022-01-25 ENCOUNTER — Ambulatory Visit: Payer: BC Managed Care – PPO | Admitting: Family Medicine

## 2022-01-25 ENCOUNTER — Encounter: Payer: Self-pay | Admitting: Family Medicine

## 2022-01-25 VITALS — BP 118/78 | HR 73 | Temp 97.8°F | Resp 16 | Ht 66.0 in | Wt 137.8 lb

## 2022-01-25 DIAGNOSIS — J0140 Acute pansinusitis, unspecified: Secondary | ICD-10-CM

## 2022-01-25 DIAGNOSIS — H579 Unspecified disorder of eye and adnexa: Secondary | ICD-10-CM | POA: Diagnosis not present

## 2022-01-25 MED ORDER — PREDNISONE 10 MG PO TABS: ORAL_TABLET | ORAL | 0 refills | Status: DC

## 2022-01-25 NOTE — Progress Notes (Signed)
   Subjective:    Patient ID: Robin Mueller, female    DOB: [birth year], 61 y.o.   MRN: [id number]  HPI MRI f/u- pt had MRI done on 5/15 per Dr Robin Mueller.  Orbits WNL.  Sinuses showed mild mucosal thickening in bilateral ethmoid sinuses and maxillary sinuses.  Small fluid level in R sphenoid sinus, mild mucosal thickening in L sphenoid sinus.  MRI was done due to L eye pain w/ looking down or closing eyes.  Pain starts above L eyebrow, then develops tingling down to upper eye lid.  When lying down at night, she feels the eye lid is putting pressure on her eye.  Sxs are now moving to the R.  Has not been to eye doctor since December.  Pressure can be relieved by pressing on orbital ridge while lying down.  Sxs initially started 3 months ago.   Review of Systems For ROS see HPI     Objective:   Physical Exam Vitals reviewed.  Constitutional:      General: She is not in acute distress.    Appearance: Normal appearance. She is not ill-appearing.  HENT:     Head: Normocephalic and atraumatic.     Nose:     Right Sinus: Maxillary sinus tenderness present. No frontal sinus tenderness.     Left Sinus: Maxillary sinus tenderness present. No frontal sinus tenderness.  Eyes:     General: Lids are normal. Gaze aligned appropriately. No allergic shiner.       Right eye: No discharge.        Left eye: No discharge.     Extraocular Movements: Extraocular movements intact.     Conjunctiva/sclera:     Right eye: Right conjunctiva is not injected.     Left eye: Left conjunctiva is not injected.  Skin:    General: Skin is warm and dry.     Findings: No rash.  Neurological:     General: No focal deficit present.     Mental Status: She is alert and oriented to person, place, and time.     Cranial Nerves: No cranial nerve deficit.  Psychiatric:        Mood and Affect: Mood normal.        Behavior: Behavior normal.        Thought Content: Thought content normal.          Assessment & Plan:    Eye pressure- new.  Reviewed Dr Robin Mueller notes, MRI.  Suspect her sinus inflammation is contributing but in case there is something else at play, will refer to Ophthalmology (pt sees Optometry).  She is to take the Prednisone taper as directed.  Pt expressed understanding and is in agreement w/ plan.   Sinusitis- recurrent issue for pt.  She is relatively asymptomatic compared to her usual sinus infxns but her persistent sinus inflammation could be the cause of her eye sxs.  Will start Prednisone taper to decrease swelling/pressure.  Pt expressed understanding and is in agreement w/ plan.

## 2022-01-25 NOTE — Patient Instructions (Signed)
Follow up as needed or as scheduled START the Prednisone as directed- take w/ food We'll call you with Ophthalmology appt Try warm or cold compresses for symptom relief Call with any questions or concerns Hang in there!!

## 2022-02-10 ENCOUNTER — Encounter: Payer: Self-pay | Admitting: Family Medicine

## 2022-02-17 ENCOUNTER — Telehealth (INDEPENDENT_AMBULATORY_CARE_PROVIDER_SITE_OTHER): Payer: BC Managed Care – PPO | Admitting: Family Medicine

## 2022-02-17 ENCOUNTER — Encounter: Payer: Self-pay | Admitting: Family Medicine

## 2022-02-17 DIAGNOSIS — J019 Acute sinusitis, unspecified: Secondary | ICD-10-CM | POA: Diagnosis not present

## 2022-02-17 DIAGNOSIS — B9689 Other specified bacterial agents as the cause of diseases classified elsewhere: Secondary | ICD-10-CM | POA: Diagnosis not present

## 2022-02-17 MED ORDER — CEFDINIR 300 MG PO CAPS: 300.0000 mg | ORAL_CAPSULE | Freq: Two times a day (BID) | ORAL | 0 refills | Status: DC

## 2022-02-17 NOTE — Progress Notes (Signed)
Virtual Visit via Video   I connected with patient on 02/17/22 at  2:40 PM EDT by a video enabled telemedicine application and verified that I am speaking with the correct person using two identifiers.  Location patient: Home Location provider: [facility] Persons participating in the virtual visit: Patient, Provider, CMA Sheryle Hail C)  I discussed the limitations of evaluation and management by telemedicine and the availability of in person appointments. The patient expressed understanding and agreed to proceed.  Subjective:   HPI:   Sinusitis- pt reports sxs started in late May w/ sore throat.  Then developed sinus congestion.  Started Mucinex D w/ some relief.  Sxs then worsened.  + facial pain.  No tooth pain.  + HA.  + mild nausea, no vomiting.    ROS:   See pertinent positives and negatives per HPI.  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/20/2015   RLS (restless legs syndrome) 07/14/2014   Physical exam 10/19/2011   Plantar fasciitis 10/19/2011   Hand pain 05/26/2011   Seasonal and perennial allergic rhinitis 04/09/2009   ANXIETY DEPRESSION 02/26/2009   GERD 10/02/2008   IBS 10/02/2008    Social History   Tobacco Use   Smoking status: Former    Packs/day: 1.00    Types: Cigarettes    Quit date: 09/06/1998    Years since quitting: 23.4   Smokeless tobacco: Never  Substance Use Topics   Alcohol use: Yes    Alcohol/week: 14.0 standard drinks of alcohol    Types: 14 Glasses of wine per week    Comment: 0-2 per day    Current Outpatient Medications:    Ascorbic Acid (VITAMIN C) 100 MG CHEW, Chew by mouth daily., Disp: , Rfl:    b complex vitamins tablet, Take 1 tablet by mouth daily., Disp: , Rfl:    BIOTIN PO, Take by mouth daily., Disp: , Rfl:    Calcium 600-200 MG-UNIT per tablet, Take 1  tablet by mouth daily., Disp: , Rfl:    esomeprazole (NEXIUM) 40 MG capsule, TAKE 1 CAPSULE (40 MG TOTAL) BY MOUTH DAILY., Disp: 90 capsule, Rfl: 1   fish oil-omega-3 fatty acids 1000 MG capsule, Take 1 g by mouth daily., Disp: , Rfl:    Flaxseed, Linseed, (FLAX SEED OIL) 1000 MG CAPS, Take by mouth daily., Disp: , Rfl:    FLUoxetine (PROZAC) 10 MG capsule, TAKE 1 CAPSULE BY MOUTH EVERY DAY, Disp: 90 capsule, Rfl: 2   ibuprofen (ADVIL) 800 MG tablet, TAKE 1 TABLET BY MOUTH EVERY 8 HOURS AS NEEDED, Disp: 180 tablet, Rfl: 0   magnesium oxide (MAG-OX) 400 MG tablet, Take 400 mg by mouth daily., Disp: , Rfl:    Polyethyl Glycol-Propyl Glycol (SYSTANE) 0.4-0.3 % SOLN, Apply 1 drop to eye in the morning and at bedtime., Disp: , Rfl:    Turmeric 500 MG CAPS, Take by mouth., Disp: , Rfl:    predniSONE (DELTASONE) 10 MG tablet, 3 tabs x3 days and then 2 tabs x3 days and then 1 tab x3 days.  Take w/ food. (Patient not taking: Reported on 02/17/2022), Disp: 18 tablet, Rfl: 0  Allergies  Allergen Reactions   Amoxicillin     Diarrhea    Adhesive [Tape] Rash   Latex Rash    She says she has a mild reaction to this  Objective:   There were no vitals taken for this visit. AAOx3, NAD NCAT, EOMI No obvious CN deficits Coloring WNL Pt is able to speak clearly, coherently without shortness of breath or increased work of breathing.  Thought process is linear.  Mood is appropriate.   Assessment and Plan:   Sinusitis- recurrent problem for pt.  Given that it has been over 2 weeks, this is likely bacterial at this point.  [place]eds ab[place]  Doxy is not a good idea.  Allergic to Amox.  Will tx w/ Omnicef.  Reviewed supportive care and red flags that should prompt return.  Pt expressed understanding and is in agreement w/ plan.    [name], MD 02/17/2022

## 2022-02-23 DIAGNOSIS — M179 Osteoarthritis of knee, unspecified: Secondary | ICD-10-CM | POA: Insufficient documentation

## 2022-03-11 ENCOUNTER — Other Ambulatory Visit: Payer: Self-pay | Admitting: Family Medicine

## 2022-04-01 ENCOUNTER — Encounter: Payer: Self-pay | Admitting: Family Medicine

## 2022-04-12 ENCOUNTER — Ambulatory Visit (INDEPENDENT_AMBULATORY_CARE_PROVIDER_SITE_OTHER): Payer: BC Managed Care – PPO | Admitting: Family Medicine

## 2022-04-12 ENCOUNTER — Encounter: Payer: Self-pay | Admitting: Family Medicine

## 2022-04-12 ENCOUNTER — Ambulatory Visit (INDEPENDENT_AMBULATORY_CARE_PROVIDER_SITE_OTHER): Payer: BC Managed Care – PPO | Admitting: Behavioral Health

## 2022-04-12 VITALS — BP 112/70 | HR 63 | Temp 97.6°F | Resp 16 | Ht 66.0 in | Wt 138.0 lb

## 2022-04-12 DIAGNOSIS — F4323 Adjustment disorder with mixed anxiety and depressed mood: Secondary | ICD-10-CM | POA: Diagnosis not present

## 2022-04-12 DIAGNOSIS — H66002 Acute suppurative otitis media without spontaneous rupture of ear drum, left ear: Secondary | ICD-10-CM

## 2022-04-12 DIAGNOSIS — J019 Acute sinusitis, unspecified: Secondary | ICD-10-CM | POA: Diagnosis not present

## 2022-04-12 DIAGNOSIS — B9689 Other specified bacterial agents as the cause of diseases classified elsewhere: Secondary | ICD-10-CM

## 2022-04-12 MED ORDER — CEFDINIR 300 MG PO CAPS: 300.0000 mg | ORAL_CAPSULE | Freq: Two times a day (BID) | ORAL | 0 refills | Status: DC

## 2022-04-12 NOTE — Progress Notes (Signed)
   Subjective:    Patient ID: Robin Mueller, female    DOB: 20-Feb-1961, 61 y.o.   MRN: [id number]  HPI Sinus infection- sxs started 6 days ago.  Denies fever.  + facial pain/pressure.  + chest congestion- particularly when lying down- 'i feel wheezy and crackly'.  L ear pain.  + cough.  No tooth pain.  + sick contacts.   Review of Systems For ROS see HPI     Objective:   Physical Exam Vitals reviewed.  Constitutional:      General: She is not in acute distress.    Appearance: Normal appearance. She is well-developed. She is not ill-appearing.  HENT:     Head: Normocephalic and atraumatic.     Right Ear: Tympanic membrane normal.     Left Ear: A middle ear effusion is present. Tympanic membrane is scarred, erythematous and bulging.     Nose: Mucosal edema and rhinorrhea present.     Right Sinus: No maxillary sinus tenderness or frontal sinus tenderness.     Left Sinus: Maxillary sinus tenderness and frontal sinus tenderness present.     Mouth/Throat:     Pharynx: Uvula midline. Posterior oropharyngeal erythema present. No oropharyngeal exudate.  Eyes:     Conjunctiva/sclera: Conjunctivae normal.     Pupils: Pupils are equal, round, and reactive to light.  Cardiovascular:     Rate and Rhythm: Normal rate and regular rhythm.     Heart sounds: Normal heart sounds.  Pulmonary:     Effort: Pulmonary effort is normal. No respiratory distress.     Breath sounds: Normal breath sounds. No wheezing.  Musculoskeletal:     Cervical back: Normal range of motion and neck supple.  Lymphadenopathy:     Cervical: No cervical adenopathy.  Skin:    General: Skin is warm and dry.  Neurological:     General: No focal deficit present.     Mental Status: She is alert and oriented to person, place, and time.     Cranial Nerves: No cranial nerve deficit.     Motor: No weakness.     Coordination: Coordination normal.  Psychiatric:        Mood and Affect: Mood normal.        Behavior:  Behavior normal.        Thought Content: Thought content normal.           Assessment & Plan:  Sinusitis/OM- pt's sxs and PE consistent w/ L OM and L frontal/maxillary sinusitis.  Start Omnicef (pt has tolerated this in the past).  Reviewed supportive care and red flags that should prompt return.  Pt expressed understanding and is in agreement w/ plan.

## 2022-04-12 NOTE — Patient Instructions (Signed)
Follow up as needed or as scheduled START the Omnicef twice daily- take w/ food Drink LOTS of fluids Continue your daily allergy medication Call with any questions or concerns Hang in there!!!

## 2022-04-12 NOTE — Progress Notes (Signed)
                Robin Mueller Robin Frankey Botting, LMFT 

## 2022-04-12 NOTE — Progress Notes (Addendum)
[facility] Counselor Initial Adult Exam  Name: Robin Mueller Date: 04/12/2022 MRN: [id number]  DOB: Jul 17, 1961 PCP: Sheliah Hatch, MD  Time spent: 50 min telephone NuPt Intake; Pt is home in private/Clinician @ [facility] on Webex video  Guardian/Payee:  Self    Paperwork requested: No   Reason for Visit /Presenting Problem: Family discord due to Mother's death almost 59 yrs ago, Bros' Tx of Pt & discussion w/Pt's Dtr have been unexpected. June 2023 vacation ended in Fr leaving early & traveling to [place].  Mental Status Exam: Appearance:   NA     Behavior:  Appropriate  Motor:  Normal  Speech/Language:   Normal Rate  Affect:  NA  Mood:  angry and anxious  Thought process:  normal  Thought content:    WNL  Sensory/Perceptual disturbances:    WNL  Orientation:  oriented to person, place, and time/date  Attention:  Good  Concentration:  Good  Memory:  WNL  Fund of knowledge:   Good  Insight:    Good  Judgment:   Good  Impulse Control:  Good   Appearance: NA    Behavior: Appropriate Motor:  NA Speech/Language: Normal Rate Affect: Appropriate Mood: anxious & angry Thought process: normal Thought content: WNL Sensory/Perceptual disturbances: WNL Orientation: oriented to person, place, and time/date Attention: Good Concentration: Good Memory: WNL Fund of knowledge: Good Insight: Good Judgment: Good Impulse Control: Good  Reported Symptoms:  Anxiety over Family discord & recent early exit of Dad from Family vacation  Risk Assessment: Danger to Self:  No Self-injurious Behavior: No Danger to Others: No Duty to Warn:no Physical Aggression / Violence:No  Access to Firearms a concern: No  Gang Involvement:No  Patient / guardian was educated about steps to take if suicide or homicide risk level increases between visits: no While future psychiatric events cannot be accurately predicted, the patient does not currently require acute inpatient  psychiatric care and does not currently meet [facility] involuntary commitment criteria.  Substance Abuse History: Current substance abuse: No     Past Psychiatric History:   No previous psychological problems have been observed Outpatient Providers: Dr. Natalia Leatherwood, MD History of Psych Hospitalization: No  Psychological Testing:  NA    Abuse History:  Victim of: No.,  NA    Report needed: No. Victim of Neglect:No. Perpetrator of  NA   Witness / Exposure to Domestic Violence: No   Protective Services Involvement: No  Witness to [employer] Violence:  No   Family History:  Family History  Problem Relation Age of Onset   Hypertension Mother    Diabetes Mother    Colon polyps Mother        precancerous   Heart attack Mother    Diverticulitis Mother    Breast cancer Mother    Hypertension Father    Stroke Father    Colon polyps Father    Melanoma Father    Diverticulitis Father    Hypertension Brother    Colon polyps Brother    Diabetes Brother    Melanoma Brother    Lupus Maternal Grandmother    Rheum arthritis Maternal Grandmother    Coronary artery disease Maternal Grandfather    Heart disease Maternal Grandfather    Basal cell carcinoma Maternal Grandfather    Colon cancer Neg Hx    Stomach cancer Neg Hx    Esophageal cancer Neg Hx    Rectal cancer Neg Hx     Living situation: the patient  lives with their spouse  Sexual Orientation: Straight  Relationship Status: married  Name of spouse / other: Michele Mcalpine If a parent, number of children / ages: Dtr in [place] (10 min away); [place]- married to Joe w/59yo named [employer]  Support Systems: NA  Financial Stress:  No   Income/Employment/Disability: Employment  Financial planner: No   Educational History: Education: Risk manager: Not noted  Any cultural differences that may affect / interfere with treatment:  not applicable   Recreation/Hobbies: Not  noted  Stressors: Loss of Mother in Oct 2021 & recent exit of Dad from her home    Strengths: Supportive Relationships, Family, and Able to Communicate Effectively  Barriers:  None noted   Legal History: Pending legal issue / charges: The patient has no significant history of legal issues. History of legal issue / charges:  NA  Medical History/Surgical History: reviewed Past Medical History:  Diagnosis Date   Allergic rhinitis    Allergy    Anxiety    Arthritis    hands, feet,back - otc med prn   Asthma    Colon polyp    Dysrhythmia    hx pvc,pac   GERD (gastroesophageal reflux disease)    Hyperlipidemia    IBS (irritable bowel syndrome)    diet controlled   Internal hemorrhoid    PONV (postoperative nausea and vomiting)    Rosacea     Past Surgical History:  Procedure Laterality Date   APPENDECTOMY     CARPAL TUNNEL RELEASE Bilateral    bilateral   CHOLECYSTECTOMY     COLONOSCOPY  2007   Gessner normal but hx polyps    CYST EXCISION Left    left hand and tendon release   FOOT NEUROMA SURGERY Right 2010   FOOT SURGERY Left    Debriement   HEMORRHOID BANDING  05/03/2016   MASS EXCISION Right 12/05/2014   Procedure: EXCISION MASS, index finger;  Surgeon: Betha Loa, MD;  Location: [facility];  Service: Orthopedics;  Laterality: Right;   NASAL SEPTUM SURGERY     POLYPECTOMY     right calcaneous surgery     TUMOR EXCISION Left    left lower leg skin   ulna nerve release Right 2007   ulna nerve release Left 2007   ULNAR NERVE TRANSPOSITION Right 2009   UPPER GASTROINTESTINAL ENDOSCOPY     VAGINAL HYSTERECTOMY  2000    Medications: Current Outpatient Medications  Medication Sig Dispense Refill   Ascorbic Acid (VITAMIN C) 100 MG CHEW Chew by mouth daily.     b complex vitamins tablet Take 1 tablet by mouth daily.     BIOTIN PO Take by mouth daily.     Calcium 600-200 MG-UNIT per tablet Take 1 tablet by mouth daily.     cefdinir (OMNICEF)  300 MG capsule Take 1 capsule (300 mg total) by mouth 2 (two) times daily. 20 capsule 0   esomeprazole (NEXIUM) 40 MG capsule TAKE 1 CAPSULE (40 MG TOTAL) BY MOUTH DAILY. 90 capsule 1   fish oil-omega-3 fatty acids 1000 MG capsule Take 1 g by mouth daily.     Flaxseed, Linseed, (FLAX SEED OIL) 1000 MG CAPS Take by mouth daily.     FLUoxetine (PROZAC) 10 MG capsule TAKE 1 CAPSULE BY MOUTH EVERY DAY 90 capsule 2   ibuprofen (ADVIL) 800 MG tablet TAKE 1 TABLET BY MOUTH EVERY 8 HOURS AS NEEDED 180 tablet 0   magnesium oxide (MAG-OX) 400 MG tablet Take  400 mg by mouth daily.     Polyethyl Glycol-Propyl Glycol (SYSTANE) 0.4-0.3 % SOLN Apply 1 drop to eye in the morning and at bedtime.     Turmeric 500 MG CAPS Take by mouth.     No current facility-administered medications for this visit.    Allergies  Allergen Reactions   Amoxicillin     Diarrhea    Adhesive [Tape] Rash   Latex Rash    She says she has a mild reaction to this    Diagnoses:  Adjustment disorder with mixed anxiety and depressed mood  LOS: [id number]  Plan of Care: Grief work; acceptance & processing of events since Oct 2021   Deneise Lever, LMFT

## 2022-04-19 ENCOUNTER — Ambulatory Visit (INDEPENDENT_AMBULATORY_CARE_PROVIDER_SITE_OTHER): Payer: BC Managed Care – PPO | Admitting: Behavioral Health

## 2022-04-19 DIAGNOSIS — F4329 Adjustment disorder with other symptoms: Secondary | ICD-10-CM

## 2022-04-19 DIAGNOSIS — F4323 Adjustment disorder with mixed anxiety and depressed mood: Secondary | ICD-10-CM | POA: Diagnosis not present

## 2022-04-19 NOTE — Progress Notes (Signed)
                Robin Mueller Robin Geary Rufo, LMFT 

## 2022-04-19 NOTE — Progress Notes (Addendum)
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  Guardian/Payee:  Self    Paperwork requested: No   Reason for Visit /Presenting Problem: Inc'd anx/dep due to recent circumstances w/Family that are distressing  Mental Status Exam: Appearance:   Neat   Behavior:  Appropriate  Motor:  unk  Speech/Language:   Normal Rate  Affect:  Appropriate  Mood:  anxious  Thought process:  normal  Thought content:    WNL  Sensory/Perceptual disturbances:    WNL  Orientation:  oriented to person, place, and time/date  Attention:  Good  Concentration:  Good  Memory:  WNL  Fund of knowledge:   Good  Insight:    Good  Judgment:   Good  Impulse Control:  Good    Reported Symptoms:  reduction in anx due to contact w/her Father in [place] & hearing his grieving process  Risk Assessment: Danger to Self:  No Self-injurious Behavior: No Danger to Others: No Duty to Warn:no Physical Aggression / Violence:No  Access to Firearms a concern: No  Gang Involvement:No  Patient / guardian was educated about steps to take if suicide or homicide risk level increases between visits: no While future psychiatric events cannot be accurately predicted, the patient does not currently require acute inpatient psychiatric care and does not currently meet [facility] involuntary commitment criteria.  Substance Abuse History: Current substance abuse: No     Past Psychiatric History:   No previous psychological problems have been observed Outpatient Providers: [name], MD History of Psych Hospitalization: No  Psychological Testing:  NA    Abuse History:  Victim of: No.,  NA    Report needed: No. Victim of Neglect:No. Perpetrator of  NA   Witness / Exposure to Domestic Violence: No    Protective Services Involvement: No  Witness to [employer] Violence:  No   Family History:  Family History  Problem Relation Age of Onset   Hypertension Mother    Diabetes Mother    Colon polyps Mother        precancerous   Heart attack Mother    Diverticulitis Mother    Breast cancer Mother    Hypertension Father    Stroke Father    Colon polyps Father    Melanoma Father    Diverticulitis Father    Hypertension Brother    Colon polyps Brother    Diabetes Brother    Melanoma Brother    Lupus Maternal Grandmother    Rheum arthritis Maternal Grandmother    Coronary artery disease Maternal Grandfather    Heart disease Maternal Grandfather    Basal cell carcinoma Maternal Grandfather    Colon cancer Neg Hx    Stomach cancer Neg Hx    Esophageal cancer Neg Hx    Rectal cancer Neg Hx     Living situation: the patient lives with their spouse  Sexual Orientation: Straight  Relationship Status: married  Name of spouse / other: Michele Mcalpine If a parent, number of children / ages: NA  Support Systems: spouse & Husb [employer] copy Stress:  No   Income/Employment/Disability: Employment  Financial planner: No   Educational History: Education: Risk manager: Not noted today  Any cultural differences that may affect / interfere with treatment:  not applicable  Recreation/Hobbies: NA  Stressors: Health problems   Loss of Mother ~ 2 yrs ago    Strengths: Supportive Relationships, Friends, and Able to Communicate Effectively  Barriers:  Pt learning to recognize & provide attn to her own grief needs   Legal History: Pending legal issue / charges: The patient has no significant history of legal issues. History of legal issue / charges:  NA  Medical History/Surgical History: reviewed Past Medical History:  Diagnosis Date   Allergic rhinitis    Allergy    Anxiety    Arthritis    hands, feet,back - otc med prn   Asthma    Colon  polyp    Dysrhythmia    hx pvc,pac   GERD (gastroesophageal reflux disease)    Hyperlipidemia    IBS (irritable bowel syndrome)    diet controlled   Internal hemorrhoid    PONV (postoperative nausea and vomiting)    Rosacea     Past Surgical History:  Procedure Laterality Date   APPENDECTOMY     CARPAL TUNNEL RELEASE Bilateral    bilateral   CHOLECYSTECTOMY     COLONOSCOPY  2007   Gessner normal but hx polyps    CYST EXCISION Left    left hand and tendon release   FOOT NEUROMA SURGERY Right 2010   FOOT SURGERY Left    Debriement   HEMORRHOID BANDING  05/03/2016   MASS EXCISION Right 12/05/2014   Procedure: EXCISION MASS, index finger;  Surgeon: Betha Loa, MD;  Location: [facility];  Service: Orthopedics;  Laterality: Right;   NASAL SEPTUM SURGERY     POLYPECTOMY     right calcaneous surgery     TUMOR EXCISION Left    left lower leg skin   ulna nerve release Right 2007   ulna nerve release Left 2007   ULNAR NERVE TRANSPOSITION Right 2009   UPPER GASTROINTESTINAL ENDOSCOPY     VAGINAL HYSTERECTOMY  2000    Medications: Current Outpatient Medications  Medication Sig Dispense Refill   Ascorbic Acid (VITAMIN C) 100 MG CHEW Chew by mouth daily.     b complex vitamins tablet Take 1 tablet by mouth daily.     BIOTIN PO Take by mouth daily.     Calcium 600-200 MG-UNIT per tablet Take 1 tablet by mouth daily.     cefdinir (OMNICEF) 300 MG capsule Take 1 capsule (300 mg total) by mouth 2 (two) times daily. 20 capsule 0   esomeprazole (NEXIUM) 40 MG capsule TAKE 1 CAPSULE (40 MG TOTAL) BY MOUTH DAILY. 90 capsule 1   fish oil-omega-3 fatty acids 1000 MG capsule Take 1 g by mouth daily.     Flaxseed, Linseed, (FLAX SEED OIL) 1000 MG CAPS Take by mouth daily.     FLUoxetine (PROZAC) 10 MG capsule TAKE 1 CAPSULE BY MOUTH EVERY DAY 90 capsule 2   ibuprofen (ADVIL) 800 MG tablet TAKE 1 TABLET BY MOUTH EVERY 8 HOURS AS NEEDED 180 tablet 0   magnesium oxide (MAG-OX)  400 MG tablet Take 400 mg by mouth daily.     Polyethyl Glycol-Propyl Glycol (SYSTANE) 0.4-0.3 % SOLN Apply 1 drop to eye in the morning and at bedtime.     Turmeric 500 MG CAPS Take by mouth.     No current facility-administered medications for this visit.    Allergies  Allergen Reactions   Amoxicillin     Diarrhea    Adhesive [Tape] Rash   Latex Rash    She says  she has a mild reaction to this    Diagnoses: Anx/Dep & grief rxn   Plan of Care: Odelia expresses difficult adjustment to grief issues that are now surfacing regarding her Mother who died 2 yrs ago. There has been Family distress/conflict; her Father was living w/Pt & her Husb & now has moved back to [place] where she & Family originated. Pt will begin to process her own grief moving forward.  Target Date: 07/20/2022 Progress: 3 Frequency: Twice monthly Modality: Ledell Peoples sts she is worried for her Dad's elder functioning & how being w/her Sonda Primes & SIL up [place] will dec his protection. Pt will communicate more w/Bros when she is concerned & inc communication directly w/her Dad who is tech saavy.  Target Date: 04/27/2022  Progress: 0  Frequency: Twice monthly  Modality: Claretta Fraise, LMFT

## 2022-04-27 ENCOUNTER — Ambulatory Visit (INDEPENDENT_AMBULATORY_CARE_PROVIDER_SITE_OTHER): Payer: BC Managed Care – PPO | Admitting: Behavioral Health

## 2022-04-27 DIAGNOSIS — F4323 Adjustment disorder with mixed anxiety and depressed mood: Secondary | ICD-10-CM | POA: Diagnosis not present

## 2022-04-27 NOTE — Progress Notes (Signed)
                Jamariah Tony L Colan Laymon, LMFT 

## 2022-05-07 NOTE — Progress Notes (Addendum)
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 [employer] Type: Individual Therapy  Reported Symptoms: Pt feels she is in a waiting pattern over too many things-it is causing elevated anx/dep  Mental Status Exam: Appearance:  NA     Behavior: Appropriate  Motor: Normal  Speech/Language:  Normal Rate  Affect: Appropriate  Mood: normal  Thought process: normal  Thought content:   WNL  Sensory/Perceptual disturbances:   WNL  Orientation: oriented to person, place, and time/date  Attention: Good  Concentration: Good  Memory: WNL  Fund of knowledge:  Good  Insight:   Good  Judgment:  Good  Impulse Control: Good   Risk Assessment: Danger to Self:  No Self-injurious Behavior: No Danger to Others: No Duty to Warn:no Physical Aggression / Violence:No  Access to Firearms a concern: No  Gang Involvement:No   Subjective: Pt's Fr called her for the first time this week. They have created an opening for future conversation. Pt will be alone w/children back in [place] soon. Pt & Husb are, "doing really well".   Interventions:  s-f therapy  Diagnosis:Adjustment disorder with mixed anxiety and depressed mood  Plan: ST: Cont to communicate w/your Fr & keep in mind the ideas we discussed to keep relationship healthy. Report back to Clinician the outcomes of these discussions w/Fr ea session.  LT: Pt has decided it would have neg impact if her Father moved back in w/the Cpl. She needs to preserve her marriage/Family. Cont to discuss the outcomes of this decision & improvements/impacts on marital relationship over the next mos. Review in late Oct 2023.  Deneise Lever, LMFT

## 2022-05-11 ENCOUNTER — Other Ambulatory Visit: Payer: Self-pay | Admitting: Family Medicine

## 2022-05-13 ENCOUNTER — Ambulatory Visit: Payer: BC Managed Care – PPO | Admitting: Family

## 2022-05-13 ENCOUNTER — Encounter: Payer: Self-pay | Admitting: Family

## 2022-05-13 VITALS — BP 124/76 | HR 66 | Temp 98.0°F | Ht 66.0 in | Wt 136.2 lb

## 2022-05-13 DIAGNOSIS — J309 Allergic rhinitis, unspecified: Secondary | ICD-10-CM

## 2022-05-13 MED ORDER — IPRATROPIUM BROMIDE 0.06 % NA SOLN: 2.0000 | Freq: Three times a day (TID) | NASAL | 1 refills | Status: DC

## 2022-05-13 NOTE — Progress Notes (Signed)
Patient ID: Robin Mueller, female    DOB: [birth year], 61 y.o.   MRN: [id number]  Chief Complaint  Patient presents with   Ear Pain    Pt states ear pain started about 2x mos on and off, lt ear    HPI:      Sinus symptoms:  nasal drainage, congestion, dry cough, ear fullness/pain off and on for over a month or 2. Denies sore throat or fever. She has been treated twice for sinusitis since June. Pt reports taking Allegra qd and using a nasal saline spray qd. Reports steroid nasal sprays give her migraines and had to stop. Reports she has environmental allergies and have bothered her since this past spring. Seen by allergy clinic years ago.      Assessment & Plan:  1. Allergic rhinitis, unspecified seasonality, unspecified trigger sending Atrovent nasal spray to use tid and recommend she try generic Xyzal qd instead of Allegra, advised on use & SE of meds. Continue to use saline nasal spray prior to the Atrovent and extra during the day. Drink at least 2L of water qd.   - ipratropium (ATROVENT) 0.06 % nasal spray; Place 2 sprays into both nostrils 3 (three) times daily.  Dispense: 15 mL; Refill: 1   Subjective:    Outpatient Medications Prior to Visit  Medication Sig Dispense Refill   Ascorbic Acid (VITAMIN C) 100 MG CHEW Chew by mouth daily.     b complex vitamins tablet Take 1 tablet by mouth daily.     BIOTIN PO Take by mouth daily.     Calcium 600-200 MG-UNIT per tablet Take 1 tablet by mouth daily.     cefdinir (OMNICEF) 300 MG capsule Take 1 capsule (300 mg total) by mouth 2 (two) times daily. 20 capsule 0   esomeprazole (NEXIUM) 40 MG capsule TAKE 1 CAPSULE (40 MG TOTAL) BY MOUTH DAILY. 90 capsule 1   fish oil-omega-3 fatty acids 1000 MG capsule Take 1 g by mouth daily.     Flaxseed, Linseed, (FLAX SEED OIL) 1000 MG CAPS Take by mouth daily.     FLUoxetine (PROZAC) 10 MG capsule TAKE 1 CAPSULE BY MOUTH EVERY DAY 90 capsule 2   ibuprofen (ADVIL) 800 MG tablet TAKE 1 TABLET  BY MOUTH EVERY 8 HOURS AS NEEDED 180 tablet 0   magnesium oxide (MAG-OX) 400 MG tablet Take 400 mg by mouth daily.     Polyethyl Glycol-Propyl Glycol (SYSTANE) 0.4-0.3 % SOLN Apply 1 drop to eye in the morning and at bedtime.     Turmeric 500 MG CAPS Take by mouth.     No facility-administered medications prior to visit.   Past Medical History:  Diagnosis Date   Allergic rhinitis    Allergy    Anxiety    Arthritis    hands, feet,back - otc med prn   Asthma    Colon polyp    Dysrhythmia    hx pvc,pac   GERD (gastroesophageal reflux disease)    Hyperlipidemia    IBS (irritable bowel syndrome)    diet controlled   Internal hemorrhoid    PONV (postoperative nausea and vomiting)    Rosacea    Past Surgical History:  Procedure Laterality Date   APPENDECTOMY     CARPAL TUNNEL RELEASE Bilateral    bilateral   CHOLECYSTECTOMY     COLONOSCOPY  2007   Gessner normal but hx polyps    CYST EXCISION Left    left hand and tendon release  FOOT NEUROMA SURGERY Right 2010   FOOT SURGERY Left    Debriement   HEMORRHOID BANDING  05/03/2016   MASS EXCISION Right 12/05/2014   Procedure: EXCISION MASS, index finger;  Surgeon: Betha Loa, MD;  Location: [facility];  Service: Orthopedics;  Laterality: Right;   NASAL SEPTUM SURGERY     POLYPECTOMY     right calcaneous surgery     TUMOR EXCISION Left    left lower leg skin   ulna nerve release Right 2007   ulna nerve release Left 2007   ULNAR NERVE TRANSPOSITION Right 2009   UPPER GASTROINTESTINAL ENDOSCOPY     VAGINAL HYSTERECTOMY  2000   Allergies  Allergen Reactions   Amoxicillin     Diarrhea    Adhesive [Tape] Rash   Latex Rash    She says she has a mild reaction to this      Objective:    Physical Exam Vitals and nursing note reviewed.  Constitutional:      Appearance: Normal appearance.  HENT:     Right Ear: Tympanic membrane and ear canal normal.     Left Ear: Tympanic membrane and ear canal normal.      Nose:     Right Sinus: No maxillary sinus tenderness or frontal sinus tenderness.     Left Sinus: No maxillary sinus tenderness or frontal sinus tenderness.     Mouth/Throat:     Mouth: Mucous membranes are moist.     Pharynx: Oropharyngeal exudate present. No pharyngeal swelling, posterior oropharyngeal erythema or uvula swelling.  Eyes:     General: Allergic shiner present.  Cardiovascular:     Rate and Rhythm: Normal rate and regular rhythm.  Pulmonary:     Effort: Pulmonary effort is normal.     Breath sounds: Normal breath sounds.  Musculoskeletal:        General: Normal range of motion.  Skin:    General: Skin is warm and dry.  Neurological:     Mental Status: She is alert.  Psychiatric:        Mood and Affect: Mood normal.        Behavior: Behavior normal.    BP 124/76   Pulse 66   Temp 98 F (36.7 C)   Ht 5\' 6"  (1.676 m)   Wt 136 lb 3.2 oz (61.8 kg)   SpO2 98%   BMI 21.98 kg/m  Wt Readings from Last 3 Encounters:  05/13/22 136 lb 3.2 oz (61.8 kg)  04/12/22 138 lb (62.6 kg)  01/25/22 137 lb 12.8 oz (62.5 kg)       01/27/22, NP

## 2022-05-14 ENCOUNTER — Ambulatory Visit: Payer: BC Managed Care – PPO | Admitting: Neurology

## 2022-05-17 ENCOUNTER — Ambulatory Visit: Payer: BC Managed Care – PPO | Admitting: Physician Assistant

## 2022-06-04 ENCOUNTER — Ambulatory Visit (INDEPENDENT_AMBULATORY_CARE_PROVIDER_SITE_OTHER): Payer: BC Managed Care – PPO | Admitting: Behavioral Health

## 2022-06-04 DIAGNOSIS — F4329 Adjustment disorder with other symptoms: Secondary | ICD-10-CM | POA: Diagnosis not present

## 2022-06-04 DIAGNOSIS — F4323 Adjustment disorder with mixed anxiety and depressed mood: Secondary | ICD-10-CM | POA: Diagnosis not present

## 2022-06-04 NOTE — Progress Notes (Signed)
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 Pt is still grieving her Mom's death.  Mental Status Exam: Appearance:  Casual     Behavior: Appropriate and Sharing  Motor: Normal  Speech/Language:  Clear and Coherent and Normal Rate  Affect: Appropriate  Mood: normal  Thought process: normal  Thought content:   WNL  Sensory/Perceptual disturbances:   WNL  Orientation: oriented to person, place, and time/date  Attention: Good  Concentration: Good  Memory: WNL  Fund of knowledge:  Good  Insight:   Good  Judgment:  Good  Impulse Control: Good   Risk Assessment: Danger to Self:  No Self-injurious Behavior: No Danger to Others: No Duty to Warn:no Physical Aggression / Violence:No  Access to Firearms a concern: No  Gang Involvement:No   Subjective: Pt is going to take the [employer] @ [employer] this Fall & is not worried for what others think/expect. Her communication w/her Father who stays w/Pt's Bros & Str In [place]. It is still difficult to speak w/her Bros & Spouse.   Interventions: Solution-Oriented/Positive Psychology  Diagnosis:Adjustment disorder with mixed anxiety and depressed mood  Grief reaction with prolonged bereavement  Plan: Robin Mueller reports she has come to terms w/her Father living up [place] w/her [place]. Fr sounds happier & he is mourning Pt's Mom. Pt will tend to her own grief process moving forward & respect the unique nature of this mourning.   Target Date: 07/04/2022  Progress: 4  Frequency: Twice monthly  Modality: Boykin Reaper, LMFT

## 2022-06-04 NOTE — Progress Notes (Signed)
                Robin Mueller L Robin Vanblarcom, LMFT 

## 2022-07-12 ENCOUNTER — Ambulatory Visit (INDEPENDENT_AMBULATORY_CARE_PROVIDER_SITE_OTHER): Payer: BC Managed Care – PPO | Admitting: Behavioral Health

## 2022-07-12 DIAGNOSIS — F4323 Adjustment disorder with mixed anxiety and depressed mood: Secondary | ICD-10-CM | POA: Diagnosis not present

## 2022-07-12 DIAGNOSIS — F4329 Adjustment disorder with other symptoms: Secondary | ICD-10-CM

## 2022-07-12 NOTE — Progress Notes (Signed)
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[place] [facility]   Treatment Type: Individual Therapy  Reported Symptoms: Pt is exp'g less anx/dep due to her Sporting Career & work duties  Mental Status Exam: Appearance:  Casual     Behavior: Appropriate and Sharing  Motor: Normal  Speech/Language:  Clear and Coherent  Affect: Appropriate  Mood: normal  Thought process: normal  Thought content:   WNL  Sensory/Perceptual disturbances:   WNL  Orientation: oriented to person, place, and time/date  Attention: Good  Concentration: Good  Memory: WNL  Fund of knowledge:  Good  Insight:   Good  Judgment:  Good  Impulse Control: Good   Risk Assessment: Danger to Self:  No Self-injurious Behavior: No Danger to Others: No Duty to Warn:no Physical Aggression / Violence:No  Access to Firearms a concern: No  Gang Involvement:No   Subjective: Pt is expressing her love of the game of Lacrosse & her schedule in the Spring Season.   Interventions: Solution-Oriented/Positive Psychology and Grief Therapy  Diagnosis:Adjustment disorder with mixed anxiety and depressed mood  Grief reaction with prolonged bereavement  Plan: Robin Mueller describes a busy Spring Season that she may have to lessen since she is 61yo Bday in 2024. 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  [name], LMFT

## 2022-07-12 NOTE — Progress Notes (Signed)
                Garielle Mroz L Sindhu Nguyen, LMFT 

## 2022-07-13 ENCOUNTER — Telehealth: Payer: Self-pay

## 2022-07-13 DIAGNOSIS — E785 Hyperlipidemia, unspecified: Secondary | ICD-10-CM

## 2022-07-13 DIAGNOSIS — Z0000 Encounter for general adult medical examination without abnormal findings: Secondary | ICD-10-CM

## 2022-07-13 NOTE — Telephone Encounter (Signed)
Patient called in and schedule lab only appointment for Monday to have physical labs preformed. These have been ordered for future

## 2022-07-15 ENCOUNTER — Ambulatory Visit
Admission: RE | Admit: 2022-07-15 | Discharge: 2022-07-15 | Disposition: A | Discharge status: Home / Self Care | Payer: BC Managed Care – PPO | Source: Ambulatory Visit | Attending: Emergency Medicine | Admitting: Emergency Medicine

## 2022-07-15 VITALS — BP 142/87 | HR 60 | Temp 98.1°F | Resp 18

## 2022-07-15 DIAGNOSIS — J3089 Other allergic rhinitis: Secondary | ICD-10-CM | POA: Diagnosis not present

## 2022-07-15 DIAGNOSIS — J302 Other seasonal allergic rhinitis: Secondary | ICD-10-CM

## 2022-07-15 DIAGNOSIS — R058 Other specified cough: Secondary | ICD-10-CM | POA: Diagnosis not present

## 2022-07-15 MED ORDER — AEROCHAMBER PLUS FLO-VU LARGE MISC: 1.0000 | Freq: Once | 0 refills | Status: AC

## 2022-07-15 MED ORDER — ALBUTEROL SULFATE HFA 108 (90 BASE) MCG/ACT IN AERS: 2.0000 | INHALATION_SPRAY | Freq: Four times a day (QID) | RESPIRATORY_TRACT | 0 refills | Status: DC | PRN

## 2022-07-15 NOTE — Discharge Instructions (Signed)
Please follow-up with your allergy specialist regarding your postnasal drip, nasal congestion and nonproductive cough.  I provided you with a prescription for albuterol.  Please inhale 2 puffs twice daily and anytime you have severe cough or feel short of breath.  At this time, I do not hear any wheezing in your lungs.  Thank you for visiting urgent care today.

## 2022-07-15 NOTE — ED Triage Notes (Signed)
Pt c/o cough, URI sx "months"-worse x 1-2 weeks-NAD-steady gait

## 2022-07-15 NOTE — ED Provider Notes (Signed)
[facility]    CSN: [id number] Arrival date & time: 07/15/22  1539    HISTORY   Chief Complaint  Patient presents with   Cough   HPI Robin Mueller is a pleasant, 61 y.o. female who presents to urgent care today. Patient complains of cough, rhinorrhea, congestion for the past several months that has become progressively worse over the past 1 to 2 weeks.  Patient has normal vital signs on arrival today.  Patient denies known sick contacts.  Patient denies headache, fever, body aches, chills, nausea, vomiting, diarrhea, sore throat, loss of taste or smell.  Patient endorses a history of asthma and perennial allergic rhinitis with seasonal variation.  Patient states she is not currently taking any medications for allergies or asthma other than Atrovent nasal spray which was prescribed for her in September of this year, as that she is not using it daily.  Patient states she was also advised to take Xyzal at that time which she is also not taking.  Patient states that nasal steroids give her migraine headaches.  Patient has been seen by allergy and immunology in the past for her ongoing allergies.  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  The history is provided by the patient.   Past Medical History:  Diagnosis Date   Allergic rhinitis    Allergy    Anxiety    Arthritis    hands, feet,back - otc med prn   Asthma    Colon polyp    Dysrhythmia    hx pvc,pac   GERD (gastroesophageal reflux disease)    Hyperlipidemia    IBS (irritable bowel syndrome)    diet controlled   Internal hemorrhoid    PONV (postoperative nausea and vomiting)    Rosacea    Patient Active Problem List   Diagnosis Date Noted   Adjustment disorder with mixed anxiety and depressed mood 04/12/2022   Osteoarthritis of knee 02/23/2022   Hyperlipidemia 07/21/2021   Latex allergy 05/31/2016    Hemorrhoids, internal, with bleeding 06/03/2015   Decreased hearing of both ears 01/20/2015   RLS (restless legs syndrome) 07/14/2014   Physical exam 10/19/2011   Plantar fasciitis 10/19/2011   Hand pain 05/26/2011   Seasonal and perennial allergic rhinitis 04/09/2009   ANXIETY DEPRESSION 02/26/2009   GERD 10/02/2008   IBS 10/02/2008   Past Surgical History:  Procedure Laterality Date   APPENDECTOMY     CARPAL TUNNEL RELEASE Bilateral    bilateral   CHOLECYSTECTOMY     COLONOSCOPY  2007   Gessner normal but hx polyps    CYST EXCISION Left    left hand and tendon release   FOOT NEUROMA SURGERY Right 2010   FOOT SURGERY Left    Debriement   HEMORRHOID BANDING  05/03/2016   MASS EXCISION Right 12/05/2014   Procedure: EXCISION MASS, index finger;  Surgeon: Betha Loa, MD;  Location: [facility];  Service: Orthopedics;  Laterality: Right;   NASAL SEPTUM SURGERY     POLYPECTOMY     right calcaneous surgery     TUMOR EXCISION Left    left lower leg skin   ulna nerve release Right 2007   ulna nerve release Left 2007   ULNAR NERVE TRANSPOSITION Right 2009   UPPER GASTROINTESTINAL ENDOSCOPY     VAGINAL HYSTERECTOMY  2000   OB History   No obstetric history  on file.    Home Medications    Prior to Admission medications   Medication Sig Start Date End Date Taking? Authorizing Provider  Ascorbic Acid (VITAMIN C) 100 MG CHEW Chew by mouth daily.    [provider]  b complex vitamins tablet Take 1 tablet by mouth daily.    [provider]  BIOTIN PO Take by mouth daily.    [provider]  Calcium 600-200 MG-UNIT per tablet Take 1 tablet by mouth daily.    [provider]  cefdinir (OMNICEF) 300 MG capsule Take 1 capsule (300 mg total) by mouth 2 (two) times daily. 04/12/22   Sheliah Hatch, MD  esomeprazole (NEXIUM) 40 MG capsule TAKE 1 CAPSULE (40 MG TOTAL) BY MOUTH DAILY. 03/11/22   Sheliah Hatch, MD  fish oil-omega-3  fatty acids 1000 MG capsule Take 1 g by mouth daily.    [provider]  Flaxseed, Linseed, (FLAX SEED OIL) 1000 MG CAPS Take by mouth daily.    [provider]  FLUoxetine (PROZAC) 10 MG capsule TAKE 1 CAPSULE BY MOUTH EVERY DAY 05/11/22   Sheliah Hatch, MD  ibuprofen (ADVIL) 800 MG tablet TAKE 1 TABLET BY MOUTH EVERY 8 HOURS AS NEEDED 01/01/19   Sheliah Hatch, MD  ipratropium (ATROVENT) 0.06 % nasal spray Place 2 sprays into both nostrils 3 (three) times daily. 05/13/22   Dulce Sellar, NP  magnesium oxide (MAG-OX) 400 MG tablet Take 400 mg by mouth daily.    [provider]  Polyethyl Glycol-Propyl Glycol (SYSTANE) 0.4-0.3 % SOLN Apply 1 drop to eye in the morning and at bedtime.    [provider]  Turmeric 500 MG CAPS Take by mouth.    [provider]    Family History Family History  Problem Relation Age of Onset   Hypertension Mother    Diabetes Mother    Colon polyps Mother        precancerous   Heart attack Mother    Diverticulitis Mother    Breast cancer Mother    Hypertension Father    Stroke Father    Colon polyps Father    Melanoma Father    Diverticulitis Father    Hypertension Brother    Colon polyps Brother    Diabetes Brother    Melanoma Brother    Lupus Maternal Grandmother    Rheum arthritis Maternal Grandmother    Coronary artery disease Maternal Grandfather    Heart disease Maternal Grandfather    Basal cell carcinoma Maternal Grandfather    Colon cancer Neg Hx    Stomach cancer Neg Hx    Esophageal cancer Neg Hx    Rectal cancer Neg Hx    Social History Social History   Tobacco Use   Smoking status: Former    Packs/day: 1.00    Types: Cigarettes    Quit date: 09/06/1998    Years since quitting: 23.8   Smokeless tobacco: Never  Vaping Use   Vaping Use: Never used  Substance Use Topics   Alcohol use: Not Currently    Comment: occ   Drug use: No   Allergies   Amoxicillin, Adhesive  [tape], and Latex  Review of Systems Review of Systems Pertinent findings revealed after performing a 14 point review of systems has been noted in the history of present illness.  Physical Exam Triage Vital Signs ED Triage Vitals  Enc Vitals Group     BP 07/03/21 0827 (!) 147/82  Pulse Rate 07/03/21 0827 72     Resp 07/03/21 0827 18     Temp 07/03/21 0827 98.3 F (36.8 C)     Temp Source 07/03/21 0827 Oral     SpO2 07/03/21 0827 98 %     Weight --      Height --      Head Circumference --      Peak Flow --      Pain Score 07/03/21 0826 5     Pain Loc --      Pain Edu? --      Excl. in GC? --   No data found.  Updated Vital Signs BP (!) 142/87 (BP Location: Right Arm)   Pulse 60   Temp 98.1 F (36.7 C) (Oral)   Resp 18   SpO2 98%   Physical Exam Vitals and nursing note reviewed.  Constitutional:      General: She is not in acute distress.    Appearance: Normal appearance. She is not ill-appearing.  HENT:     Head: Normocephalic and atraumatic.     Salivary Glands: Right salivary gland is not diffusely enlarged or tender. Left salivary gland is not diffusely enlarged or tender.     Right Ear: Ear canal and external ear normal. No drainage. A middle ear effusion is present. There is no impacted cerumen. Tympanic membrane is bulging. Tympanic membrane is not injected or erythematous.     Left Ear: Ear canal and external ear normal. No drainage. A middle ear effusion is present. There is no impacted cerumen. Tympanic membrane is bulging. Tympanic membrane is not injected or erythematous.     Ears:     Comments: Bilateral EACs normal, both TMs bulging with clear fluid    Nose: Rhinorrhea present. No nasal deformity, septal deviation, signs of injury, nasal tenderness, mucosal edema or congestion. Rhinorrhea is clear.     Right Nostril: Occlusion present. No foreign body, epistaxis or septal hematoma.     Left Nostril: Occlusion present. No foreign body, epistaxis or  septal hematoma.     Right Turbinates: Enlarged, swollen and pale.     Left Turbinates: Enlarged, swollen and pale.     Right Sinus: No maxillary sinus tenderness or frontal sinus tenderness.     Left Sinus: No maxillary sinus tenderness or frontal sinus tenderness.     Mouth/Throat:     Lips: Pink. No lesions.     Mouth: Mucous membranes are moist. No oral lesions.     Pharynx: Oropharynx is clear. Uvula midline. No posterior oropharyngeal erythema or uvula swelling.     Tonsils: No tonsillar exudate. 0 on the right. 0 on the left.     Comments: Postnasal drip Eyes:     General: Lids are normal.        Right eye: No discharge.        Left eye: No discharge.     Extraocular Movements: Extraocular movements intact.     Conjunctiva/sclera: Conjunctivae normal.     Right eye: Right conjunctiva is not injected.     Left eye: Left conjunctiva is not injected.  Neck:     Trachea: Trachea and phonation normal.  Cardiovascular:     Rate and Rhythm: Normal rate and regular rhythm.     Pulses: Normal pulses.     Heart sounds: Normal heart sounds. No murmur heard.    No friction rub. No gallop.  Pulmonary:     Effort: Pulmonary effort is normal. No tachypnea,  bradypnea, accessory muscle usage, prolonged expiration, respiratory distress or retractions.     Breath sounds: Normal breath sounds and air entry. No stridor, decreased air movement or transmitted upper airway sounds. No decreased breath sounds, wheezing, rhonchi or rales.     Comments: No wheezing appreciated on exam Chest:     Chest wall: No tenderness.  Musculoskeletal:        General: Normal range of motion.     Cervical back: Normal range of motion and neck supple. Normal range of motion.  Lymphadenopathy:     Cervical: No cervical adenopathy.  Skin:    General: Skin is warm and dry.     Findings: No erythema or rash.  Neurological:     General: No focal deficit present.     Mental Status: She is alert and oriented to  person, place, and time.  Psychiatric:        Mood and Affect: Mood normal.        Behavior: Behavior normal.     Visual Acuity Right Eye Distance:   Left Eye Distance:   Bilateral Distance:    Right Eye Near:   Left Eye Near:    Bilateral Near:     UC Couse / Diagnostics / Procedures:     Radiology No results found.  Procedures Procedures (including critical care time) EKG  Pending results:  Labs Reviewed - No data to display  Medications Ordered in UC: Medications - No data to display  UC Diagnoses / Final Clinical Impressions(s)   I have reviewed the triage vital signs and the nursing notes.  Pertinent labs & imaging results that were available during my care of the patient were reviewed by me and considered in my medical decision making (see chart for details).    Final diagnoses:  Perennial allergic rhinitis with seasonal variation  Nonproductive cough   Patient advised to follow-up with allergy specialist for uncontrolled allergies and intolerance of all nasal sprays.  Patient provided with an albuterol inhaler for her severe cough although no wheezing was appreciated on exam today.  Return precautions advised.  ED Prescriptions     Medication Sig Dispense Auth. Provider   albuterol (VENTOLIN HFA) 108 (90 Base) MCG/ACT inhaler Inhale 2 puffs into the lungs every 6 (six) hours as needed for wheezing or shortness of breath (Cough). 18 g Theadora RamaMorgan, Stephen Baruch Scales, PA-C   Spacer/Aero-Holding Chambers (AEROCHAMBER PLUS FLO-VU LARGE) MISC 1 each by Other route once for 1 dose. 1 each Theadora RamaMorgan, Caitland Porchia Scales, PA-C      PDMP not reviewed this encounter.  Disposition Upon Discharge:  Condition: stable for discharge home Home: take medications as prescribed; routine discharge instructions as discussed; follow up as advised.  Patient presented with an acute illness with associated systemic symptoms and significant discomfort requiring urgent management. In my opinion,  this is a condition that a prudent lay person (someone who possesses an average knowledge of health and medicine) may potentially expect to result in complications if not addressed urgently such as respiratory distress, impairment of bodily function or dysfunction of bodily organs.   Routine symptom specific, illness specific and/or disease specific instructions were discussed with the patient and/or caregiver at length.   As such, the patient has been evaluated and assessed, work-up was performed and treatment was provided in alignment with urgent care protocols and evidence based medicine.  Patient/parent/caregiver has been advised that the patient may require follow up for further testing and treatment if the symptoms continue in spite  of treatment, as clinically indicated and appropriate.  If the patient was tested for COVID-19, Influenza and/or RSV, then the patient/parent/guardian was advised to isolate at home pending the results of his/her diagnostic coronavirus test and potentially longer if they're positive. I have also advised pt that if his/her COVID-19 test returns positive, it's recommended to self-isolate for at least 10 days after symptoms first appeared AND until fever-free for 24 hours without fever reducer AND other symptoms have improved or resolved. Discussed self-isolation recommendations as well as instructions for household member/close contacts as per the [facility] and [place] DHHS, and also gave patient the COVID packet with this information.  Patient/parent/caregiver has been advised to return to the [facility] or PCP in 3-5 days if no better; to PCP or the Emergency Department if new signs and symptoms develop, or if the current signs or symptoms continue to change or worsen for further workup, evaluation and treatment as clinically indicated and appropriate  The patient will follow up with their current PCP if and as advised. If the patient does not currently have a PCP we will assist them in  obtaining one.   The patient may need specialty follow up if the symptoms continue, in spite of conservative treatment and management, for further workup, evaluation, consultation and treatment as clinically indicated and appropriate.  Patient/parent/caregiver verbalized understanding and agreement of plan as discussed.  All questions were addressed during visit.  Please see discharge instructions below for further details of plan.  Discharge Instructions:   Discharge Instructions      Please follow-up with your allergy specialist regarding your postnasal drip, nasal congestion and nonproductive cough.  I provided you with a prescription for albuterol.  Please inhale 2 puffs twice daily and anytime you have severe cough or feel short of breath.  At this time, I do not hear any wheezing in your lungs.  Thank you for visiting urgent care today.      This office note has been dictated using Teaching laboratory technician.  Unfortunately, this method of dictation can sometimes lead to typographical or grammatical errors.  I apologize for your inconvenience in advance if this occurs.  Please do not hesitate to reach out to me if clarification is needed.      Theadora Rama Scales, PA-C 07/15/22 1631

## 2022-07-16 ENCOUNTER — Encounter: Payer: Self-pay | Admitting: Family Medicine

## 2022-07-16 ENCOUNTER — Ambulatory Visit (INDEPENDENT_AMBULATORY_CARE_PROVIDER_SITE_OTHER): Payer: BC Managed Care – PPO | Admitting: Family Medicine

## 2022-07-16 VITALS — BP 114/78 | HR 76 | Temp 98.8°F | Resp 18 | Ht 66.0 in | Wt 137.5 lb

## 2022-07-16 DIAGNOSIS — J302 Other seasonal allergic rhinitis: Secondary | ICD-10-CM

## 2022-07-16 DIAGNOSIS — J3089 Other allergic rhinitis: Secondary | ICD-10-CM

## 2022-07-16 MED ORDER — PREDNISONE 10 MG PO TABS: ORAL_TABLET | ORAL | 0 refills | Status: DC

## 2022-07-16 NOTE — Patient Instructions (Signed)
Follow up as needed or as scheduled START the Prednisone as directed- 3 pills at the same time x3 days, then 2 pills at the same time x3 days, then 1 tab daily.  Take w/ food Continue to take Allegra D daily Drink LOTS of fluids Call with any questions or concerns Hang in there!!!

## 2022-07-16 NOTE — Progress Notes (Signed)
   Subjective:    Patient ID: Robin Mueller, female    DOB: 02/18/1961, 61 y.o.   MRN: [id number]  HPI Cough/congestion- was seen at [facility] yesterday and told she was having allergies.  Pt reports sxs have 'been off and on all summer long'.  + PND, sore throat, cough, congestion.  Restarted Allegra D.  Doesn't like Xyzal.  OTC Benadryl.  Steroid nasal sprays trigger migraines.  Pt feels like she is on the verge of sinus infection.  Mucous is thickening but has not changed colors.      Review of Systems For ROS see HPI     Objective:   Physical Exam Vitals reviewed.  Constitutional:      General: She is not in acute distress.    Appearance: Normal appearance. She is well-developed. She is not ill-appearing.  HENT:     Head: Normocephalic and atraumatic.     Right Ear: Tympanic membrane and ear canal normal.     Left Ear: Tympanic membrane and ear canal normal.     Nose: Mucosal edema and congestion present. No rhinorrhea.     Right Sinus: No maxillary sinus tenderness or frontal sinus tenderness.     Left Sinus: No maxillary sinus tenderness or frontal sinus tenderness.     Mouth/Throat:     Pharynx: Posterior oropharyngeal erythema (w/ PND) present.  Eyes:     Conjunctiva/sclera: Conjunctivae normal.     Pupils: Pupils are equal, round, and reactive to light.  Cardiovascular:     Rate and Rhythm: Normal rate and regular rhythm.     Pulses: Normal pulses.     Heart sounds: Normal heart sounds.  Pulmonary:     Effort: Pulmonary effort is normal. No respiratory distress.     Breath sounds: Normal breath sounds. No wheezing or rales.  Musculoskeletal:     Cervical back: Normal range of motion and neck supple.  Lymphadenopathy:     Cervical: No cervical adenopathy.  Skin:    General: Skin is warm and dry.  Neurological:     General: No focal deficit present.     Mental Status: She is alert and oriented to person, place, and time.  Psychiatric:        Mood and Affect: Mood  normal.        Behavior: Behavior normal.        Thought Content: Thought content normal.           Assessment & Plan:

## 2022-07-16 NOTE — Assessment & Plan Note (Signed)
Deteriorated.  Pt has had months of congestion, drainage, cough.  Does not feel that she has a sinus infection currently- as she gets these frequently enough to recognize the sxs- but is not getting relief w/ OTC allergy meds.  Unable to use nasal steroids due to migraines.  Start Prednisone taper and monitor for improvement.  Pt expressed understanding and is in agreement w/ plan.

## 2022-07-19 ENCOUNTER — Other Ambulatory Visit (INDEPENDENT_AMBULATORY_CARE_PROVIDER_SITE_OTHER): Payer: BC Managed Care – PPO

## 2022-07-19 DIAGNOSIS — E785 Hyperlipidemia, unspecified: Secondary | ICD-10-CM | POA: Diagnosis not present

## 2022-07-19 DIAGNOSIS — Z0000 Encounter for general adult medical examination without abnormal findings: Secondary | ICD-10-CM

## 2022-07-19 LAB — TSH: TSH: 0.92 u[IU]/mL (ref 0.35–5.50)

## 2022-07-19 LAB — CBC WITH DIFFERENTIAL/PLATELET
Basophils Absolute: 0 10*3/uL (ref 0.0–0.1)
Basophils Relative: 0.1 % (ref 0.0–3.0)
Eosinophils Absolute: 0 10*3/uL (ref 0.0–0.7)
Eosinophils Relative: 0 % (ref 0.0–5.0)
HCT: 38.4 % (ref 36.0–46.0)
Hemoglobin: 13 g/dL (ref 12.0–15.0)
Lymphocytes Relative: 13 % (ref 12.0–46.0)
Lymphs Abs: 1.7 10*3/uL (ref 0.7–4.0)
MCHC: 33.8 g/dL (ref 30.0–36.0)
MCV: 99.3 fl (ref 78.0–100.0)
Monocytes Absolute: 1.1 10*3/uL — ABNORMAL HIGH (ref 0.1–1.0)
Monocytes Relative: 8.3 % (ref 3.0–12.0)
Neutro Abs: 10 10*3/uL — ABNORMAL HIGH (ref 1.4–7.7)
Neutrophils Relative %: 78.6 % — ABNORMAL HIGH (ref 43.0–77.0)
Platelets: 332 10*3/uL (ref 150.0–400.0)
RBC: 3.86 Mil/uL — ABNORMAL LOW (ref 3.87–5.11)
RDW: 13 % (ref 11.5–15.5)
WBC: 12.7 10*3/uL — ABNORMAL HIGH (ref 4.0–10.5)

## 2022-07-19 LAB — LIPID PANEL
Cholesterol: 298 mg/dL — ABNORMAL HIGH (ref 0–200)
HDL: 75.5 mg/dL (ref >39.00)
LDL Cholesterol: 183 mg/dL — ABNORMAL HIGH (ref 0–99)
NonHDL: 222.54
Total CHOL/HDL Ratio: 4
Triglycerides: 196 mg/dL — ABNORMAL HIGH (ref 0.0–149.0)
VLDL: 39.2 mg/dL (ref 0.0–40.0)

## 2022-07-19 LAB — HEPATIC FUNCTION PANEL
ALT: 19 U/L (ref 0–35)
AST: 20 U/L (ref 0–37)
Albumin: 4.8 g/dL (ref 3.5–5.2)
Alkaline Phosphatase: 39 U/L (ref 39–117)
Bilirubin, Direct: 0.1 mg/dL (ref 0.0–0.3)
Total Bilirubin: 0.5 mg/dL (ref 0.2–1.2)
Total Protein: 7.1 g/dL (ref 6.0–8.3)

## 2022-07-20 ENCOUNTER — Telehealth: Payer: Self-pay

## 2022-07-20 LAB — BASIC METABOLIC PANEL WITH GFR
BUN: 22 mg/dL (ref 7–25)
CO2: 26 mmol/L (ref 20–32)
Calcium: 9.8 mg/dL (ref 8.6–10.4)
Chloride: 103 mmol/L (ref 98–110)
Creat: 0.82 mg/dL (ref 0.50–1.03)
Glucose, Bld: 103 mg/dL — ABNORMAL HIGH (ref 65–99)
Potassium: 4 mmol/L (ref 3.5–5.3)
Sodium: 140 mmol/L (ref 135–146)
eGFR: 82 mL/min/{1.73_m2} (ref >=60)

## 2022-07-20 NOTE — Telephone Encounter (Signed)
-----   Message from Sheliah Hatch, MD sent at 07/20/2022  2:04 PM EST ----- Total cholesterol and LDL (bad cholesterol) have both jumped by about 30 points.  Based on this, I want to start Crestor 20mg  nightly while working on healthy diet and regular exercise.  We will also repeat your liver functions at a lab only visit in 6 weeks (dx hyperlipidemia)  WBC (white blood cell count) is mildly elevated but this is due to the prednisone you are taking and no cause for concern  Remainder of labs look great!

## 2022-07-20 NOTE — Telephone Encounter (Signed)
Called patient to get her scheduled for 6 week follow up labs no answer LM to call back

## 2022-07-21 NOTE — Telephone Encounter (Signed)
Patient has appt 07/23/22 can schedule at that time

## 2022-07-23 ENCOUNTER — Encounter: Payer: Self-pay | Admitting: Family Medicine

## 2022-07-23 ENCOUNTER — Ambulatory Visit (INDEPENDENT_AMBULATORY_CARE_PROVIDER_SITE_OTHER): Payer: BC Managed Care – PPO | Admitting: Family Medicine

## 2022-07-23 VITALS — BP 110/70 | HR 62 | Temp 98.1°F | Resp 16 | Ht 66.0 in | Wt 138.0 lb

## 2022-07-23 DIAGNOSIS — E785 Hyperlipidemia, unspecified: Secondary | ICD-10-CM | POA: Diagnosis not present

## 2022-07-23 DIAGNOSIS — Z0000 Encounter for general adult medical examination without abnormal findings: Secondary | ICD-10-CM

## 2022-07-23 MED ORDER — ROSUVASTATIN CALCIUM 20 MG PO TABS: 20.0000 mg | ORAL_TABLET | Freq: Every day | ORAL | 3 refills | Status: DC

## 2022-07-23 NOTE — Assessment & Plan Note (Addendum)
Pt's PE WNL.  UTD on mammo, colonoscopy, Tdap, flu.  Check labs.  Anticipatory guidance provided.  °

## 2022-07-23 NOTE — Progress Notes (Signed)
   Subjective:    Patient ID: Robin Mueller, female    DOB: [birth year], 61 y.o.   MRN: [id number]  HPI CPE- UTD on mammo, colonoscopy, Tdap, flu.  No concerns today  Patient Care Team    Relationship Specialty Notifications Start End  Sheliah Hatch, MD PCP - General   08/19/10   Iva Boop, MD Consulting Physician Gastroenterology  07/21/21   Drema Dallas, DO Consulting Physician Neurology  12/30/21      Health Maintenance  Topic Date Due   MAMMOGRAM  11/25/2022   COLONOSCOPY (Pts 45-106yrs Insurance coverage will need to be confirmed)  08/03/2025   INFLUENZA VACCINE  Completed   Hepatitis C Screening  Completed   Zoster Vaccines- Shingrix  Completed   HPV VACCINES  Aged Out   COVID-19 Vaccine  Discontinued   HIV Screening  Discontinued      Review of Systems Patient reports no vision/ hearing changes, adenopathy,fever, weight change,  persistant/recurrent hoarseness , swallowing issues, chest pain, palpitations, edema, persistant/recurrent cough, hemoptysis, dyspnea (rest/exertional/paroxysmal nocturnal), gastrointestinal bleeding (melena, rectal bleeding), abdominal pain, significant heartburn, bowel changes, GU symptoms (dysuria, hematuria, incontinence), Gyn symptoms (abnormal  bleeding, pain),  syncope, focal weakness, memory loss, numbness & tingling, skin/hair/nail changes, abnormal bruising or bleeding, anxiety, or depression.     Objective:   Physical Exam General Appearance:    Alert, cooperative, no distress, appears stated age  Head:    Normocephalic, without obvious abnormality, atraumatic  Eyes:    PERRL, conjunctiva/corneas clear, EOM's intact both eyes  Ears:    Normal TM's and external ear canals, both ears  Nose:   Nares normal, septum midline, mucosa normal, no drainage    or sinus tenderness  Throat:   Lips, mucosa, and tongue normal; teeth and gums normal  Neck:   Supple, symmetrical, trachea midline, no adenopathy;    Thyroid: no  enlargement/tenderness/nodules  Back:     Symmetric, no curvature, ROM normal, no CVA tenderness  Lungs:     Clear to auscultation bilaterally, respirations unlabored  Chest Wall:    No tenderness or deformity   Heart:    Regular rate and rhythm, S1 and S2 normal, no murmur, rub   or gallop  Breast Exam:    Deferred to GYN  Abdomen:     Soft, non-tender, bowel sounds active all four quadrants,    no masses, no organomegaly  Genitalia:    Deferred to GYN  Rectal:    Extremities:   Extremities normal, atraumatic, no cyanosis or edema  Pulses:   2+ and symmetric all extremities  Skin:   Skin color, texture, turgor normal, no rashes or lesions  Lymph nodes:   Cervical, supraclavicular, and axillary nodes normal  Neurologic:   CNII-XII intact, normal strength, sensation and reflexes    throughout          Assessment & Plan:

## 2022-07-23 NOTE — Patient Instructions (Signed)
Follow up in 1 year or as needed We'll notify you of your lab results and make any changes if needed Keep up the good work on healthy diet and regular exercise- you look great! Call with any questions or concerns Stay Safe! Stay Healthy! Happy Holidays!!! 

## 2022-08-01 NOTE — Assessment & Plan Note (Signed)
Chronic problem.  Has been attempting to control w/ diet and exercise.  Check labs and determine if medication is needed

## 2022-08-08 ENCOUNTER — Other Ambulatory Visit: Payer: Self-pay | Admitting: Family Medicine

## 2022-08-09 ENCOUNTER — Other Ambulatory Visit: Payer: Self-pay | Admitting: Family Medicine

## 2022-08-20 ENCOUNTER — Encounter (INDEPENDENT_AMBULATORY_CARE_PROVIDER_SITE_OTHER): Payer: BC Managed Care – PPO | Admitting: Family Medicine

## 2022-08-20 DIAGNOSIS — J019 Acute sinusitis, unspecified: Secondary | ICD-10-CM | POA: Diagnosis not present

## 2022-08-20 MED ORDER — PREDNISONE 10 MG PO TABS: ORAL_TABLET | ORAL | 0 refills | Status: DC

## 2022-08-20 NOTE — Telephone Encounter (Signed)
[facility] VISIT   Patient agreed to [facility] visit and is aware that copayment and coinsurance may apply. Patient was treated using telemedicine according to accepted telemedicine protocols.  Subjective:   Patient complains of sinus pressure  Patient Active Problem List   Diagnosis Date Noted   Adjustment disorder with mixed anxiety and depressed mood 04/12/2022   Osteoarthritis of knee 02/23/2022   Hyperlipidemia 07/21/2021   Latex allergy 05/31/2016   Hemorrhoids, internal, with bleeding 06/03/2015   Decreased hearing of both ears 01/20/2015   RLS (restless legs syndrome) 07/14/2014   Physical exam 10/19/2011   Plantar fasciitis 10/19/2011   Hand pain 05/26/2011   Seasonal and perennial allergic rhinitis 04/09/2009   ANXIETY DEPRESSION 02/26/2009   GERD 10/02/2008   IBS 10/02/2008   Social History   Tobacco Use   Smoking status: Former    Packs/day: 1.00    Types: Cigarettes    Quit date: 09/06/1998    Years since quitting: 23.9   Smokeless tobacco: Never  Substance Use Topics   Alcohol use: Not Currently    Comment: occ    Current Outpatient Medications:    predniSONE (DELTASONE) 10 MG tablet, 3 tabs x3 days and then 2 tabs x3 days and then 1 tab x3 days.  Take w/ food., Disp: 18 tablet, Rfl: 0   albuterol (VENTOLIN HFA) 108 (90 Base) MCG/ACT inhaler, Inhale 2 puffs into the lungs every 6 (six) hours as needed for wheezing or shortness of breath (Cough)., Disp: 18 g, Rfl: 0   Ascorbic Acid (VITAMIN C) 100 MG CHEW, Chew by mouth daily., Disp: , Rfl:    b complex vitamins tablet, Take 1 tablet by mouth daily., Disp: , Rfl:    BIOTIN PO, Take by mouth daily., Disp: , Rfl:    Calcium 600-200 MG-UNIT per tablet, Take 1 tablet by mouth daily., Disp: , Rfl:    esomeprazole (NEXIUM) 40 MG capsule, TAKE 1 CAPSULE (40 MG TOTAL) BY MOUTH DAILY., Disp: 90 capsule, Rfl: 1   fish oil-omega-3 fatty acids 1000 MG capsule, Take 1 g by mouth daily., Disp: , Rfl:    Flaxseed, Linseed,  (FLAX SEED OIL) 1000 MG CAPS, Take by mouth daily., Disp: , Rfl:    FLUoxetine (PROZAC) 10 MG capsule, TAKE 1 CAPSULE BY MOUTH EVERY DAY, Disp: 90 capsule, Rfl: 2   ibuprofen (ADVIL) 800 MG tablet, TAKE 1 TABLET BY MOUTH EVERY 8 HOURS AS NEEDED, Disp: 180 tablet, Rfl: 0   magnesium oxide (MAG-OX) 400 MG tablet, Take 400 mg by mouth daily., Disp: , Rfl:    Polyethyl Glycol-Propyl Glycol (SYSTANE) 0.4-0.3 % SOLN, Apply 1 drop to eye in the morning and at bedtime., Disp: , Rfl:    rosuvastatin (CRESTOR) 20 MG tablet, Take 1 tablet (20 mg total) by mouth daily., Disp: 30 tablet, Rfl: 3   Turmeric 500 MG CAPS, Take by mouth., Disp: , Rfl:   Allergies  Allergen Reactions   Amoxicillin     Diarrhea    Adhesive [Tape] Rash   Latex Rash    She says she has a mild reaction to this    Assessment and Plan:   Diagnosis: sinusitis. Please see myChart communication and orders below.   No orders of the defined types were placed in this encounter.  Meds ordered this encounter  Medications   predniSONE (DELTASONE) 10 MG tablet    Sig: 3 tabs x3 days and then 2 tabs x3 days and then 1 tab x3 days.  Take w/  food.    Dispense:  18 tablet    Refill:  0    [name], MD 08/20/2022  A total of 5 minutes were spent by me to personally review the patient-generated inquiry, review patient records and data pertinent to assessment of the patient's problem, develop a management plan including generation of prescriptions and/or orders, and on subsequent communication with the patient through secure the MyChart portal service.   There is no separately reported E/M service related to this service in the past 7 days nor does the patient have an upcoming soonest available appointment for this issue. This work was completed in less than 7 days.   The patient consented to this service today (see patient agreement prior to ongoing communication). Patient counseled regarding the need for in-person exam for  certain conditions and was advised to call the office if any changing or worsening symptoms occur.   The codes to be used for the E/M service are: [x]   99421 for 5-10 minutes of time spent on the inquiry. []   for 11-20 minutes. []   for 21+ minutes.

## 2022-09-07 ENCOUNTER — Other Ambulatory Visit (INDEPENDENT_AMBULATORY_CARE_PROVIDER_SITE_OTHER): Payer: BC Managed Care – PPO

## 2022-09-07 ENCOUNTER — Other Ambulatory Visit: Payer: Self-pay | Admitting: Family Medicine

## 2022-09-07 DIAGNOSIS — E785 Hyperlipidemia, unspecified: Secondary | ICD-10-CM

## 2022-09-07 LAB — HEPATIC FUNCTION PANEL
ALT: 26 U/L (ref 0–35)
AST: 30 U/L (ref 0–37)
Albumin: 4.3 g/dL (ref 3.5–5.2)
Alkaline Phosphatase: 35 U/L — ABNORMAL LOW (ref 39–117)
Bilirubin, Direct: 0.1 mg/dL (ref 0.0–0.3)
Total Bilirubin: 0.8 mg/dL (ref 0.2–1.2)
Total Protein: 6.5 g/dL (ref 6.0–8.3)

## 2022-09-07 NOTE — Progress Notes (Signed)
Orders entered for lab visit

## 2022-09-12 ENCOUNTER — Telehealth: Payer: BC Managed Care – PPO | Admitting: Family

## 2022-09-12 DIAGNOSIS — U071 COVID-19: Secondary | ICD-10-CM

## 2022-09-12 MED ORDER — MOLNUPIRAVIR EUA 200MG CAPSULE: 4.0000 | ORAL_CAPSULE | Freq: Two times a day (BID) | ORAL | 0 refills | Status: DC

## 2022-09-12 MED ORDER — NIRMATRELVIR/RITONAVIR (PAXLOVID)TABLET: 3.0000 | ORAL_TABLET | Freq: Two times a day (BID) | ORAL | 0 refills | Status: AC

## 2022-09-12 MED ORDER — BENZONATATE 100 MG PO CAPS: 100.0000 mg | ORAL_CAPSULE | Freq: Three times a day (TID) | ORAL | 0 refills | Status: DC | PRN

## 2022-09-12 NOTE — Progress Notes (Signed)
Virtual Visit Consent   Robin Mueller, you are scheduled for a virtual visit with a [facility] provider today. Just as with appointments in the office, your consent must be obtained to participate. Your consent will be active for this visit and any virtual visit you may have with one of our providers in the next 365 days. If you have a MyChart account, a copy of this consent can be sent to you electronically.  As this is a virtual visit, video technology does not allow for your provider to perform a traditional examination. This may limit your provider's ability to fully assess your condition. If your provider identifies any concerns that need to be evaluated in person or the need to arrange testing (such as labs, EKG, etc.), we will make arrangements to do so. Although advances in technology are sophisticated, we cannot ensure that it will always work on either your end or our end. If the connection with a video visit is poor, the visit may have to be switched to a telephone visit. With either a video or telephone visit, we are not always able to ensure that we have a secure connection.  By engaging in this virtual visit, you consent to the provision of healthcare and authorize for your insurance to be billed (if applicable) for the services provided during this visit. Depending on your insurance coverage, you may receive a charge related to this service.  I need to obtain your verbal consent now. Are you willing to proceed with your visit today? Robin Mueller has provided verbal consent on 09/12/2022 for a virtual visit (video or telephone). Jannifer Rodney, FNP  Date: 09/12/2022 2:36 PM  Virtual Visit via Video Note   I, Jannifer Rodney, connected with  Robin Mueller  ([id number], 08-19-1961) on 09/12/22 at  2:30 PM EST by a video-enabled telemedicine application and verified that I am speaking with the correct person using two identifiers.  Location: Patient: Virtual Visit Location  Patient: Home Provider: Virtual Visit Location Provider: Home Office   I discussed the limitations of evaluation and management by telemedicine and the availability of in person appointments. The patient expressed understanding and agreed to proceed.    History of Present Illness: Robin Mueller is a 62 y.o. who identifies as a female who was assigned female at birth, and is being seen today for body aches, congestion, cough, that started yesterday. Tested positive for COVID today.   HPI: URI  This is a new problem. The current episode started yesterday. The problem has been gradually worsening. There has been no fever. Associated symptoms include congestion, coughing, ear pain, headaches, joint swelling, nausea, rhinorrhea and sneezing. Pertinent negatives include no sinus pain, sore throat, vomiting or wheezing. She has tried acetaminophen for the symptoms. The treatment provided mild relief.    Problems:  Patient Active Problem List   Diagnosis Date Noted   Adjustment disorder with mixed anxiety and depressed mood 04/12/2022   Osteoarthritis of knee 02/23/2022   Hyperlipidemia 07/21/2021   Latex allergy 05/31/2016   Hemorrhoids, internal, with bleeding 06/03/2015   Decreased hearing of both ears 01/20/2015   RLS (restless legs syndrome) 07/14/2014   Physical exam 10/19/2011   Plantar fasciitis 10/19/2011   Hand pain 05/26/2011   Seasonal and perennial allergic rhinitis 04/09/2009   ANXIETY DEPRESSION 02/26/2009   GERD 10/02/2008   IBS 10/02/2008    Allergies:  Allergies  Allergen Reactions   Amoxicillin     Diarrhea    Adhesive [  Tape] Rash   Latex Rash    She says she has a mild reaction to this   Medications:  Current Outpatient Medications:    benzonatate (TESSALON PERLES) 100 MG capsule, Take 1 capsule (100 mg total) by mouth 3 (three) times daily as needed., Disp: 20 capsule, Rfl: 0   molnupiravir EUA (LAGEVRIO) 200 mg CAPS capsule, Take 4 capsules (800 mg total)  by mouth 2 (two) times daily for 5 days., Disp: 40 capsule, Rfl: 0   albuterol (VENTOLIN HFA) 108 (90 Base) MCG/ACT inhaler, Inhale 2 puffs into the lungs every 6 (six) hours as needed for wheezing or shortness of breath (Cough)., Disp: 18 g, Rfl: 0   Ascorbic Acid (VITAMIN C) 100 MG CHEW, Chew by mouth daily., Disp: , Rfl:    b complex vitamins tablet, Take 1 tablet by mouth daily., Disp: , Rfl:    BIOTIN PO, Take by mouth daily., Disp: , Rfl:    Calcium 600-200 MG-UNIT per tablet, Take 1 tablet by mouth daily., Disp: , Rfl:    esomeprazole (NEXIUM) 40 MG capsule, TAKE 1 CAPSULE (40 MG TOTAL) BY MOUTH DAILY., Disp: 90 capsule, Rfl: 1   fish oil-omega-3 fatty acids 1000 MG capsule, Take 1 g by mouth daily., Disp: , Rfl:    Flaxseed, Linseed, (FLAX SEED OIL) 1000 MG CAPS, Take by mouth daily., Disp: , Rfl:    FLUoxetine (PROZAC) 10 MG capsule, TAKE 1 CAPSULE BY MOUTH EVERY DAY, Disp: 90 capsule, Rfl: 2   ibuprofen (ADVIL) 800 MG tablet, TAKE 1 TABLET BY MOUTH EVERY 8 HOURS AS NEEDED, Disp: 180 tablet, Rfl: 0   magnesium oxide (MAG-OX) 400 MG tablet, Take 400 mg by mouth daily., Disp: , Rfl:    Polyethyl Glycol-Propyl Glycol (SYSTANE) 0.4-0.3 % SOLN, Apply 1 drop to eye in the morning and at bedtime., Disp: , Rfl:    predniSONE (DELTASONE) 10 MG tablet, 3 tabs x3 days and then 2 tabs x3 days and then 1 tab x3 days.  Take w/ food., Disp: 18 tablet, Rfl: 0   rosuvastatin (CRESTOR) 20 MG tablet, Take 1 tablet (20 mg total) by mouth daily., Disp: 30 tablet, Rfl: 3   Turmeric 500 MG CAPS, Take by mouth., Disp: , Rfl:   Observations/Objective: Patient is well-developed, well-nourished in no acute distress.  Resting comfortably  at home.  Head is normocephalic, atraumatic.  No labored breathing.  Speech is clear and coherent with logical content.  Patient is alert and oriented at baseline.  Nasal congestion   Assessment and Plan: 1. COVID-19 - molnupiravir EUA (LAGEVRIO) 200 mg CAPS capsule; Take  4 capsules (800 mg total) by mouth 2 (two) times daily for 5 days.  Dispense: 40 capsule; Refill: 0 - benzonatate (TESSALON PERLES) 100 MG capsule; Take 1 capsule (100 mg total) by mouth 3 (three) times daily as needed.  Dispense: 20 capsule; Refill: 0  COVID positive, rest, force fluids, tylenol as needed, Quarantine for at least 5 days and you are fever free, then must wear a mask out in public from day 3-78, report any worsening symptoms such as increased shortness of breath, swelling, or continued high fevers. Possible adverse effects discussed with antivirals.    Follow Up Instructions: I discussed the assessment and treatment plan with the patient. The patient was provided an opportunity to ask questions and all were answered. The patient agreed with the plan and demonstrated an understanding of the instructions.  A copy of instructions were sent to the patient via  MyChart unless otherwise noted below.     The patient was advised to call back or seek an in-person evaluation if the symptoms worsen or if the condition fails to improve as anticipated.  Time:  I spent 9 minutes with the patient via telehealth technology discussing the above problems/concerns.    Evelina Dun, FNP

## 2022-09-12 NOTE — Addendum Note (Signed)
Addended by: Evelina Dun A on: 09/12/2022 04:08 PM   Modules accepted: Orders

## 2022-09-13 ENCOUNTER — Encounter: Payer: Self-pay | Admitting: Family Medicine

## 2022-11-09 ENCOUNTER — Other Ambulatory Visit: Payer: Self-pay | Admitting: Family Medicine

## 2022-11-15 ENCOUNTER — Encounter: Payer: Self-pay | Admitting: Family Medicine

## 2022-11-15 ENCOUNTER — Ambulatory Visit (INDEPENDENT_AMBULATORY_CARE_PROVIDER_SITE_OTHER): Payer: BC Managed Care – PPO | Admitting: Family Medicine

## 2022-11-15 VITALS — BP 118/80 | HR 61 | Temp 98.1°F | Resp 16 | Ht 66.0 in | Wt 140.1 lb

## 2022-11-15 DIAGNOSIS — B9689 Other specified bacterial agents as the cause of diseases classified elsewhere: Secondary | ICD-10-CM | POA: Diagnosis not present

## 2022-11-15 DIAGNOSIS — J019 Acute sinusitis, unspecified: Secondary | ICD-10-CM | POA: Diagnosis not present

## 2022-11-15 MED ORDER — DOXYCYCLINE HYCLATE 100 MG PO TABS: 100.0000 mg | ORAL_TABLET | Freq: Two times a day (BID) | ORAL | 0 refills | Status: DC

## 2022-11-15 NOTE — Patient Instructions (Signed)
Follow up as needed or as scheduled START the Doxycycline twice daily- take w/ food Make sure you are taking your allergy medication daily- pollen counts are high Drink lots of fluids Rest! Call with any questions or concerns Hang in there!!!

## 2022-11-15 NOTE — Progress Notes (Signed)
   Subjective:    Patient ID: Robin Mueller, female    DOB: [birth year], 62 y.o.   MRN: [id number]  HPI Ear pain- sxs started 3/4 w/ sinus pain/pressure.  No fever.  Continues to have facial pain/pressure.  Bilateral ear pain.  + tooth pain.  No cough.  + HA.  No N/V/D.   Review of Systems For ROS see HPI     Objective:   Physical Exam Vitals reviewed.  Constitutional:      General: She is not in acute distress.    Appearance: Normal appearance. She is well-developed. She is not ill-appearing.  HENT:     Head: Normocephalic and atraumatic.     Right Ear: Tympanic membrane normal.     Left Ear: Tympanic membrane normal.     Nose: Mucosal edema and congestion present. No rhinorrhea.     Right Sinus: Maxillary sinus tenderness and frontal sinus tenderness present.     Left Sinus: Maxillary sinus tenderness and frontal sinus tenderness present.     Mouth/Throat:     Pharynx: Uvula midline. Posterior oropharyngeal erythema present. No oropharyngeal exudate.  Eyes:     Conjunctiva/sclera: Conjunctivae normal.     Pupils: Pupils are equal, round, and reactive to light.  Cardiovascular:     Rate and Rhythm: Normal rate and regular rhythm.     Heart sounds: Normal heart sounds.  Pulmonary:     Effort: Pulmonary effort is normal. No respiratory distress.     Breath sounds: Normal breath sounds. No wheezing.  Musculoskeletal:     Cervical back: Normal range of motion and neck supple.  Lymphadenopathy:     Cervical: No cervical adenopathy.  Skin:    General: Skin is warm and dry.  Neurological:     General: No focal deficit present.     Mental Status: She is alert and oriented to person, place, and time.     Cranial Nerves: No cranial nerve deficit.     Motor: No weakness.     Coordination: Coordination normal.  Psychiatric:        Mood and Affect: Mood normal.        Behavior: Behavior normal.        Thought Content: Thought content normal.           Assessment &  Plan:  Bacterial sinusitis- pt has hx of similar.  Start Doxycycline due to PCN allergy.  Reviewed supportive care and red flags that should prompt return.  Pt expressed understanding and is in agreement w/ plan.

## 2022-11-16 NOTE — Telephone Encounter (Signed)
Okay to provide work note? Visit yesterday 11/15/22

## 2022-11-23 LAB — HM MAMMOGRAPHY

## 2023-01-26 ENCOUNTER — Other Ambulatory Visit: Payer: Self-pay | Admitting: Family Medicine

## 2023-04-12 ENCOUNTER — Encounter: Payer: Self-pay | Admitting: Family Medicine

## 2023-04-12 ENCOUNTER — Ambulatory Visit: Payer: BC Managed Care – PPO | Admitting: Family Medicine

## 2023-04-12 VITALS — BP 122/80 | HR 61 | Temp 97.9°F | Resp 16 | Ht 66.0 in | Wt 140.2 lb

## 2023-04-12 DIAGNOSIS — Z23 Encounter for immunization: Secondary | ICD-10-CM | POA: Diagnosis not present

## 2023-04-12 DIAGNOSIS — L03115 Cellulitis of right lower limb: Secondary | ICD-10-CM | POA: Diagnosis not present

## 2023-04-12 DIAGNOSIS — T63441A Toxic effect of venom of bees, accidental (unintentional), initial encounter: Secondary | ICD-10-CM

## 2023-04-12 MED ORDER — TRIAMCINOLONE ACETONIDE 0.1 % EX OINT: 1.0000 | TOPICAL_OINTMENT | Freq: Two times a day (BID) | CUTANEOUS | 1 refills | Status: DC

## 2023-04-12 MED ORDER — CEPHALEXIN 500 MG PO CAPS: 500.0000 mg | ORAL_CAPSULE | Freq: Three times a day (TID) | ORAL | 0 refills | Status: AC

## 2023-04-12 NOTE — Patient Instructions (Addendum)
Follow up as needed or as scheduled START the Cephalexin 3x/day w/ food APPLY the Triamcinolone ointment twice daily for the itching Continue to use Benadryl as needed Call with any questions or concerns Hang in there!!

## 2023-04-12 NOTE — Progress Notes (Unsigned)
   Subjective:    Patient ID: ALECHIA COUSINEAU, female    DOB: 03/03/1961, 62 y.o.   MRN: [id number]  HPI Bee sting- occurred morning of 8/3.  Saturday did not think anything of it, went about her day but by Sunday was developing pain, redness, swelling.  Had pain w/ walking, not just TTP.  Not draining or oozing.  Due for Tdap.  R lower leg is swollen down to foot.   Review of Systems For ROS see HPI     Objective:   Physical Exam Vitals reviewed.  Constitutional:      General: She is not in acute distress.    Appearance: Normal appearance. She is not ill-appearing.  Cardiovascular:     Pulses: Normal pulses.  Skin:    General: Skin is warm and dry.     Findings: Erythema (large area of redness extending from R mid calf to just above ankle medially, TTP) present.  Neurological:     Mental Status: She is alert.           Assessment & Plan:  Bee sting w/ resulting cellulitis- new.  No obvious sting site- no foreign body.  Large area of redness that has extended from sting site inferiorly down leg.  Painful, itchy.  Will start Keflex to cover cellulitis, Triamcinolone to help w/ itching, and Tdap updated.  Reviewed supportive care and red flags that should prompt return.  Pt expressed understanding and is in agreement w/ plan.

## 2023-04-24 ENCOUNTER — Other Ambulatory Visit: Payer: Self-pay | Admitting: Family Medicine

## 2023-05-19 ENCOUNTER — Encounter: Payer: Self-pay | Admitting: Family Medicine

## 2023-05-19 ENCOUNTER — Ambulatory Visit: Payer: BC Managed Care – PPO | Admitting: Family Medicine

## 2023-05-19 VITALS — BP 118/80 | HR 65 | Temp 97.9°F | Resp 17 | Ht 66.0 in | Wt 141.4 lb

## 2023-05-19 DIAGNOSIS — B9689 Other specified bacterial agents as the cause of diseases classified elsewhere: Secondary | ICD-10-CM | POA: Diagnosis not present

## 2023-05-19 DIAGNOSIS — J019 Acute sinusitis, unspecified: Secondary | ICD-10-CM

## 2023-05-19 MED ORDER — DOXYCYCLINE HYCLATE 100 MG PO TABS: 100.0000 mg | ORAL_TABLET | Freq: Two times a day (BID) | ORAL | 0 refills | Status: DC

## 2023-05-19 NOTE — Patient Instructions (Signed)
Follow up as needed or as scheduled START the Doxycycline twice daily- take w/ food Drink LOTS of fluids Call with any questions or concerns Hang in there!!!

## 2023-05-19 NOTE — Progress Notes (Signed)
   Subjective:    Patient ID: Robin Mueller, female    DOB: Jun 06, 1961, 62 y.o.   MRN: [id number]  HPI URI- sxs started ~3 weeks ago.  No fever.  + facial pain, bilateral ear pain. + nasal congestion.  + mild HA.  No tooth pain.  Cough due to PND.  No known sick contacts.     Review of Systems For ROS see HPI     Objective:   Physical Exam Vitals reviewed.  Constitutional:      General: She is not in acute distress.    Appearance: Normal appearance. She is well-developed. She is not ill-appearing.  HENT:     Head: Normocephalic and atraumatic.     Right Ear: Tympanic membrane normal.     Left Ear: Tympanic membrane normal.     Nose: Mucosal edema and congestion present. No rhinorrhea.     Right Sinus: Maxillary sinus tenderness and frontal sinus tenderness present.     Left Sinus: Maxillary sinus tenderness and frontal sinus tenderness present.     Mouth/Throat:     Pharynx: Uvula midline. Posterior oropharyngeal erythema present. No oropharyngeal exudate.  Eyes:     Conjunctiva/sclera: Conjunctivae normal.     Pupils: Pupils are equal, round, and reactive to light.  Cardiovascular:     Rate and Rhythm: Normal rate and regular rhythm.     Heart sounds: Normal heart sounds.  Pulmonary:     Effort: Pulmonary effort is normal. No respiratory distress.     Breath sounds: Normal breath sounds. No wheezing.  Musculoskeletal:     Cervical back: Normal range of motion and neck supple.  Lymphadenopathy:     Cervical: No cervical adenopathy.  Skin:    General: Skin is warm and dry.  Neurological:     General: No focal deficit present.     Mental Status: She is alert and oriented to person, place, and time.     Cranial Nerves: No cranial nerve deficit.     Motor: No weakness.     Coordination: Coordination normal.  Psychiatric:        Mood and Affect: Mood normal.        Behavior: Behavior normal.        Thought Content: Thought content normal.           Assessment  & Plan:   Bacterial sinusitis- pt has hx of similar.  Sxs started 3 weeks ago and now has facial pain.  Start Doxy due to pt's Amox allergy.  Reviewed supportive care and red flags that should prompt return.  Pt expressed understanding and is in agreement w/ plan.

## 2023-07-16 ENCOUNTER — Other Ambulatory Visit: Payer: Self-pay | Admitting: Family Medicine

## 2023-07-25 ENCOUNTER — Ambulatory Visit (INDEPENDENT_AMBULATORY_CARE_PROVIDER_SITE_OTHER): Payer: BC Managed Care – PPO | Admitting: Family Medicine

## 2023-07-25 ENCOUNTER — Encounter: Payer: Self-pay | Admitting: Family Medicine

## 2023-07-25 VITALS — BP 130/72 | HR 58 | Temp 97.8°F | Ht 66.0 in | Wt 141.4 lb

## 2023-07-25 DIAGNOSIS — Z114 Encounter for screening for human immunodeficiency virus [HIV]: Secondary | ICD-10-CM

## 2023-07-25 DIAGNOSIS — Z0000 Encounter for general adult medical examination without abnormal findings: Secondary | ICD-10-CM

## 2023-07-25 DIAGNOSIS — E785 Hyperlipidemia, unspecified: Secondary | ICD-10-CM | POA: Diagnosis not present

## 2023-07-25 DIAGNOSIS — G4700 Insomnia, unspecified: Secondary | ICD-10-CM

## 2023-07-25 DIAGNOSIS — Z0001 Encounter for general adult medical examination with abnormal findings: Secondary | ICD-10-CM

## 2023-07-25 DIAGNOSIS — B351 Tinea unguium: Secondary | ICD-10-CM

## 2023-07-25 DIAGNOSIS — R1033 Periumbilical pain: Secondary | ICD-10-CM

## 2023-07-25 MED ORDER — TRAZODONE HCL 50 MG PO TABS
25.0000 mg | ORAL_TABLET | Freq: Every evening | ORAL | 3 refills | Status: DC | PRN
Start: 2023-07-25 — End: 2023-11-14

## 2023-07-25 MED ORDER — TERBINAFINE HCL 250 MG PO TABS: 250.0000 mg | ORAL_TABLET | Freq: Every day | ORAL | 0 refills | Status: DC

## 2023-07-25 NOTE — Patient Instructions (Addendum)
Follow up in 6 months to recheck cholesterol We'll notify you of your lab results and make any changes if needed Keep up the good work on healthy diet and regular exercise- you look great! START the Trazodone for sleep.  Start w/ 1/2 tab nightly Keep a log of your abdominal pain- when it starts, how long it lasts, etc TAKE the Terbinafine daily for nail fungus HOLD the Crestor while taking the Terbinafine Call with any questions or concerns Stay Safe!  Stay Healthy! Happy Holidays!!

## 2023-07-25 NOTE — Assessment & Plan Note (Signed)
Pt's PE WNL.  UTD on mammo, colonoscopy, Tdap, flu.  Check labs.  Anticipatory guidance provided.

## 2023-07-25 NOTE — Progress Notes (Signed)
Subjective:    Patient ID: Robin Mueller, female    DOB: 1960/12/11, 62 y.o.   MRN: [id number]  HPI CPE- UTD on mammo, colonoscopy, Tdap, flu.  Patient Care Team    Relationship Specialty Notifications Start End  Sheliah Hatch, MD PCP - General Family Medicine  09/07/22   Iva Boop, MD Consulting Physician Gastroenterology  07/21/21   Drema Dallas, DO Consulting Physician Neurology  12/30/21     Health Maintenance  Topic Date Due   HIV Screening  Never done   COVID-19 Vaccine (5 - 2023-24 season) 08/07/2023   MAMMOGRAM  11/23/2023   Colonoscopy  08/03/2025   DTaP/Tdap/Td (3 - Td or Tdap) 04/11/2033   INFLUENZA VACCINE  Completed   Hepatitis C Screening  Completed   Zoster Vaccines- Shingrix  Completed   HPV VACCINES  Aged Out      Review of Systems Patient reports no vision/ hearing changes, adenopathy,fever, weight change,  persistant/recurrent hoarseness , swallowing issues, chest pain, palpitations, edema, persistant/recurrent cough, hemoptysis, dyspnea (rest/exertional/paroxysmal nocturnal), gastrointestinal bleeding (melena, rectal bleeding), significant heartburn, bowel changes, GU symptoms (dysuria, hematuria, incontinence), Gyn symptoms (abnormal  bleeding, pain),  syncope, focal weakness, memory loss, numbness & tingling, skin/hair changes, abnormal bruising or bleeding, anxiety, or depression.   + insomnia- pt reports difficulty staying asleep.  Will fall asleep w/o difficulty but wake around 3am.   + R periumbilical pain- sxs started w/in the past month.  No N/V.  No change in bowel habits.  S/p cholecystectomy.  Sxs are intermittent.  No relation to eating.  No particular trigger.  Will resolve spontaneously.    + toenail fungus- no improvement w/ OTC treatment    Objective:   Physical Exam General Appearance:    Alert, cooperative, no distress, appears stated age  Head:    Normocephalic, without obvious abnormality, atraumatic  Eyes:    PERRL,  conjunctiva/corneas clear, EOM's intact, fundi    benign, both eyes  Ears:    Normal TM's and external ear canals, both ears  Nose:   Nares normal, septum midline, mucosa normal, no drainage    or sinus tenderness  Throat:   Lips, mucosa, and tongue normal; teeth and gums normal  Neck:   Supple, symmetrical, trachea midline, no adenopathy;    Thyroid: no enlargement/tenderness/nodules  Back:     Symmetric, no curvature, ROM normal, no CVA tenderness  Lungs:     Clear to auscultation bilaterally, respirations unlabored  Chest Wall:    No tenderness or deformity   Heart:    Regular rate and rhythm, S1 and S2 normal, no murmur, rub   or gallop  Breast Exam:    Deferred to GYN  Abdomen:     Soft, non-tender, bowel sounds active all four quadrants,    no masses, no organomegaly  Genitalia:    Deferred to GYN  Rectal:    Extremities:   Extremities normal, atraumatic, no cyanosis or edema  Pulses:   2+ and symmetric all extremities  Skin:   Skin color, texture, turgor normal, no rashes or lesions  Lymph nodes:   Cervical, supraclavicular, and axillary nodes normal  Neurologic:   CNII-XII intact, normal strength, sensation and reflexes    throughout          Assessment & Plan:  Insomnia- new.  Pt reports having a hard time staying asleep.  She would like something to help.  Will start w/ Trazodone and monitor for improvement  Toe nail  fungus- new to provider, ongoing for pt.  No relief w/ OTC products.  Will start Terbinafine and she will hold Crestor while taking this for 90 days.  Pt expressed understanding and is in agreement w/ plan.   Abd pain- recurrent.  Pt has hx of similar 2 yrs ago and initially had fluid filled structure on abd [place] but MRCP 2 weeks later was clear.  Pain is episodic and just R of belly button.  Encouraged her to keep a log to see when sxs occur, how long they last, and if there's a particular trigger.  Will follow.

## 2023-07-25 NOTE — Assessment & Plan Note (Signed)
Chronic problem.  Tolerating statin w/o difficulty.  Will hold Crestor while pt is taking anti-fungal medication.  Pt expressed understanding and is in agreement w/ plan.

## 2023-07-26 ENCOUNTER — Other Ambulatory Visit: Payer: BC Managed Care – PPO

## 2023-07-26 LAB — BASIC METABOLIC PANEL
BUN: 18 mg/dL (ref 6–23)
CO2: 28 meq/L (ref 19–32)
Calcium: 9.6 mg/dL (ref 8.4–10.5)
Chloride: 102 meq/L (ref 96–112)
Creatinine, Ser: 0.83 mg/dL (ref 0.40–1.20)
GFR: 76.58 mL/min (ref >60.00)
Glucose, Bld: 95 mg/dL (ref 70–99)
Potassium: 4.4 meq/L (ref 3.5–5.1)
Sodium: 139 meq/L (ref 135–145)

## 2023-07-26 LAB — TSH: TSH: 5.75 u[IU]/mL — ABNORMAL HIGH (ref 0.35–5.50)

## 2023-07-26 LAB — LIPID PANEL
Cholesterol: 240 mg/dL — ABNORMAL HIGH (ref 0–200)
HDL: 63.6 mg/dL (ref >39.00)
LDL Cholesterol: 135 mg/dL — ABNORMAL HIGH (ref 0–99)
NonHDL: 176.3
Total CHOL/HDL Ratio: 4
Triglycerides: 206 mg/dL — ABNORMAL HIGH (ref 0.0–149.0)
VLDL: 41.2 mg/dL — ABNORMAL HIGH (ref 0.0–40.0)

## 2023-07-26 LAB — CBC WITH DIFFERENTIAL/PLATELET
Basophils Absolute: 0.1 10*3/uL (ref 0.0–0.1)
Basophils Relative: 0.9 % (ref 0.0–3.0)
Eosinophils Absolute: 0.3 10*3/uL (ref 0.0–0.7)
Eosinophils Relative: 5.3 % — ABNORMAL HIGH (ref 0.0–5.0)
HCT: 42.8 % (ref 36.0–46.0)
Hemoglobin: 14.2 g/dL (ref 12.0–15.0)
Lymphocytes Relative: 29.7 % (ref 12.0–46.0)
Lymphs Abs: 1.9 10*3/uL (ref 0.7–4.0)
MCHC: 33.3 g/dL (ref 30.0–36.0)
MCV: 99 fL (ref 78.0–100.0)
Monocytes Absolute: 0.6 10*3/uL (ref 0.1–1.0)
Monocytes Relative: 9.7 % (ref 3.0–12.0)
Neutro Abs: 3.4 10*3/uL (ref 1.4–7.7)
Neutrophils Relative %: 54.4 % (ref 43.0–77.0)
Platelets: 277 10*3/uL (ref 150.0–400.0)
RBC: 4.32 Mil/uL (ref 3.87–5.11)
RDW: 12.9 % (ref 11.5–15.5)
WBC: 6.3 10*3/uL (ref 4.0–10.5)

## 2023-07-26 LAB — HEPATIC FUNCTION PANEL
ALT: 26 U/L (ref 0–35)
AST: 27 U/L (ref 0–37)
Albumin: 4.8 g/dL (ref 3.5–5.2)
Alkaline Phosphatase: 46 U/L (ref 39–117)
Bilirubin, Direct: 0.1 mg/dL (ref 0.0–0.3)
Total Bilirubin: 0.9 mg/dL (ref 0.2–1.2)
Total Protein: 7.1 g/dL (ref 6.0–8.3)

## 2023-07-27 ENCOUNTER — Other Ambulatory Visit: Payer: Self-pay

## 2023-07-27 ENCOUNTER — Telehealth: Payer: Self-pay

## 2023-07-27 DIAGNOSIS — E039 Hypothyroidism, unspecified: Secondary | ICD-10-CM

## 2023-07-27 MED ORDER — LEVOTHYROXINE SODIUM 50 MCG PO TABS: 50.0000 ug | ORAL_TABLET | Freq: Every day | ORAL | 1 refills | Status: DC

## 2023-07-27 NOTE — Telephone Encounter (Signed)
-----   Message from [name] sent at 07/27/2023 11:54 AM EST ----- Total cholesterol and LDL (bad cholesterol) are better than last check but still above goal.  Please make sure you are taking your Crestor (Rosuvastatin) daily.  Also, your TSH is mildly elevated which means your thyroid is under functioning.  Based on this, we're going to start Levothyroxine daily and repeat your TSH level at a lab only visit in 1 month (TSH, dx hypothyroid)  Remainder of labs look good!

## 2023-07-27 NOTE — Telephone Encounter (Signed)
She is correct.  She needs to hold the cholesterol medication while taking the nail fungus medication but then restart the Crestor once she is done w/ the Terbinafine

## 2023-07-27 NOTE — Telephone Encounter (Signed)
Pt has been notified.

## 2023-07-28 LAB — HIV ANTIBODY (ROUTINE TESTING W REFLEX): HIV 1&2 Ab, 4th Generation: NONREACTIVE

## 2023-08-15 ENCOUNTER — Encounter: Payer: Self-pay | Admitting: Family Medicine

## 2023-08-15 DIAGNOSIS — R1033 Periumbilical pain: Secondary | ICD-10-CM

## 2023-08-16 ENCOUNTER — Other Ambulatory Visit: Payer: Self-pay | Admitting: Family Medicine

## 2023-08-17 ENCOUNTER — Encounter: Payer: Self-pay | Admitting: Family Medicine

## 2023-08-17 NOTE — Addendum Note (Signed)
Addended by: Sheliah Hatch on: 08/17/2023 07:20 AM   Modules accepted: Orders

## 2023-08-18 ENCOUNTER — Other Ambulatory Visit: Payer: Self-pay | Admitting: Family Medicine

## 2023-08-18 DIAGNOSIS — E039 Hypothyroidism, unspecified: Secondary | ICD-10-CM

## 2023-08-18 DIAGNOSIS — M25562 Pain in left knee: Secondary | ICD-10-CM | POA: Insufficient documentation

## 2023-08-23 ENCOUNTER — Other Ambulatory Visit: Payer: Self-pay | Admitting: Family Medicine

## 2023-08-23 DIAGNOSIS — E039 Hypothyroidism, unspecified: Secondary | ICD-10-CM

## 2023-08-25 ENCOUNTER — Ambulatory Visit (HOSPITAL_BASED_OUTPATIENT_CLINIC_OR_DEPARTMENT_OTHER)
Admission: RE | Admit: 2023-08-25 | Discharge: 2023-08-25 | Disposition: A | Discharge status: Home / Self Care | Payer: BC Managed Care – PPO | Source: Ambulatory Visit | Attending: Family Medicine | Admitting: Family Medicine

## 2023-08-25 ENCOUNTER — Telehealth: Payer: Self-pay

## 2023-08-25 DIAGNOSIS — R1033 Periumbilical pain: Secondary | ICD-10-CM | POA: Insufficient documentation

## 2023-08-25 NOTE — Telephone Encounter (Signed)
Pt has been notified Pt has no concerns Pt reports she will call and will make appt

## 2023-08-25 NOTE — Telephone Encounter (Signed)
-----   Message from [name] sent at 08/25/2023 11:14 AM EST ----- Your abdominal ultrasound is normal- this is great news!  Since you're still having pain and we don't have an obvious cause, I would message Dr Adela Lank (GI) and/or schedule an appt so we can get to the bottom of this.  Happy Holidays!

## 2023-08-26 ENCOUNTER — Other Ambulatory Visit (INDEPENDENT_AMBULATORY_CARE_PROVIDER_SITE_OTHER): Payer: BC Managed Care – PPO

## 2023-08-26 ENCOUNTER — Telehealth: Payer: Self-pay

## 2023-08-26 ENCOUNTER — Encounter: Payer: Self-pay | Admitting: Family Medicine

## 2023-08-26 DIAGNOSIS — E039 Hypothyroidism, unspecified: Secondary | ICD-10-CM | POA: Diagnosis not present

## 2023-08-26 LAB — TSH: TSH: 1.65 u[IU]/mL (ref 0.35–5.50)

## 2023-08-26 NOTE — Telephone Encounter (Signed)
Pt has been notified.

## 2023-08-26 NOTE — Telephone Encounter (Signed)
-----   Message from [name] sent at 08/26/2023  4:17 PM EST ----- TSH is now normal- great news!  No changes at this time

## 2023-09-14 ENCOUNTER — Encounter: Payer: Self-pay | Admitting: Internal Medicine

## 2023-09-14 ENCOUNTER — Ambulatory Visit: Payer: 59 | Admitting: Internal Medicine

## 2023-09-14 ENCOUNTER — Other Ambulatory Visit: Payer: Self-pay | Admitting: Gastroenterology

## 2023-09-14 VITALS — BP 110/60 | HR 71 | Ht 66.0 in | Wt 144.0 lb

## 2023-09-14 DIAGNOSIS — K219 Gastro-esophageal reflux disease without esophagitis: Secondary | ICD-10-CM | POA: Diagnosis not present

## 2023-09-14 DIAGNOSIS — R1011 Right upper quadrant pain: Secondary | ICD-10-CM | POA: Diagnosis not present

## 2023-09-14 DIAGNOSIS — K580 Irritable bowel syndrome with diarrhea: Secondary | ICD-10-CM

## 2023-09-14 MED ORDER — DEXLANSOPRAZOLE 60 MG PO CPDR: 60.0000 mg | DELAYED_RELEASE_CAPSULE | Freq: Every day | ORAL | 0 refills | Status: DC

## 2023-09-14 MED ORDER — DICYCLOMINE HCL 20 MG PO TABS: 20.0000 mg | ORAL_TABLET | Freq: Three times a day (TID) | ORAL | 2 refills | Status: DC

## 2023-09-14 NOTE — Progress Notes (Signed)
 Chief Complaint: Upper abd pain Primary GI Doctor: Dr. Avram  HPI: Patient is a 63 y.o. female with a past medical history significant for hemorrhoids s/p banding, IBS, chronic GERD, hypothyroidism, migraines, depression, appendectomy, hysterectomy, and cholecystectomy who presents today with  On 07/25/2023 patient seen by PCP and c/o R periumbilical pain, recurrent.Encouraged her to keep a log to see when sxs occur, how long they last, and if there's a particular trigger.    On 10/09/2021 she was last seen in our GI clinic by Vina, NP for evaluation of RUQ pain that started 2 years prior (2021).LFTs, CBC normal. An US  which showed a well-circumscribed 2.6 x 1.8 cm fluid collection near the porta hepatis possibly a focally dilated common bile duct, an indeterminate fluid collection, or even fluid-filled duodenum. However, MRCP two weeks later was negative. Chronic GERD and been on multiple medications. Dexilant  worked the best. EGD ordered and showed no cause for her abdominal pain. No evidence of H. pylori and benign polyp in her stomach.  No evidence of Barrett's. -Dr. Avram prescribed dicyclomine  20 mg before meals to see if it was spastic disorder. Recommendations to follow-up in 1 month.   Interval History     Patient presents with main complaint of R periumbilical pain that started back in September 2024. She has had similar intermittent episodes of pain before but it has been a few years. She states it originally started after her gallbladder was removed over 15 years ago. No known triggers at that time. No increased stress. No new foods or dietary changes. No new medications. No exposure or viral illness. Patient did stop NSAID's she was taking no improvement in current symptoms. She is pending workup with orthopedics for possible OA. She does state the pain seems to be worse if she lays on her back or on her left side. She also feels the pain before she needs to have bowel movement.       Patient has history of GERD and currently taking Nexium  40 mg po daily. She reports she has had some pyrosis last few months. The Dexilant  worked the best, but too expensive. Patient reports intermittent episodes of dysphagia with liquids and pills. Occurs few times a month. Patient recently diagnosed with hypothyroidism and placed on medication. Last TSH normal. She reports her dermatologist examined her and felt thyroid  nodule, however she has not discussed this with PCP. Defers swallow study for now. Patient denies nausea, vomiting, or weight loss. Patient reports she typically has 2-5 unformed stools per day. She reports this has increased from her norm by 1-2 stools per day. Admits to urgency. She will have an accident on average once a year. No daily incontinence. Dr. Avram did prescribe dicyclomine  back in 2023 after her EGD results were normal to see if this would help with her symptoms, but unfortunately she cannot recall if she took the medication and if so for how long.  Filed Weights   09/14/23 0951  Weight: 144 lb (65.3 kg)    Past Medical History:  Diagnosis Date   Allergic rhinitis    Allergy     Anxiety    Arthritis    hands, feet,back - otc med prn   Asthma    Colon polyp    Dysrhythmia    hx pvc,pac   GERD (gastroesophageal reflux disease)    Hyperlipidemia    IBS (irritable bowel syndrome)    diet controlled   Internal hemorrhoid    PONV (postoperative nausea and vomiting)  Rosacea    Past Surgical History:  Procedure Laterality Date   APPENDECTOMY     CARPAL TUNNEL RELEASE Bilateral    bilateral   CHOLECYSTECTOMY     COLONOSCOPY  2007   Gessner normal but hx polyps    CYST EXCISION Left    left hand and tendon release   FOOT NEUROMA SURGERY Right 2010   FOOT SURGERY Left    Debriement   HEMORRHOID BANDING  05/03/2016   MASS EXCISION Right 12/05/2014   Procedure: EXCISION MASS, index finger;  Surgeon: Franky Curia, MD;  Location: [facility];   Service: Orthopedics;  Laterality: Right;   NASAL SEPTUM SURGERY     POLYPECTOMY     right calcaneous surgery     TUMOR EXCISION Left    left lower leg skin   ulna nerve release Right 2007   ulna nerve release Left 2007   ULNAR NERVE TRANSPOSITION Right 2009   UPPER GASTROINTESTINAL ENDOSCOPY     VAGINAL HYSTERECTOMY  2000    Current Outpatient Medications  Medication Sig Dispense Refill   Ascorbic Acid (VITAMIN C) 100 MG CHEW Chew by mouth daily.     Calcium  600-200 MG-UNIT per tablet Take 1 tablet by mouth daily.     dexlansoprazole  (DEXILANT ) 60 MG capsule Take 1 capsule (60 mg total) by mouth daily. 90 capsule 0   dicyclomine  (BENTYL ) 20 MG tablet Take 1 tablet (20 mg total) by mouth 3 (three) times daily before meals. 90 tablet 2   fish oil-omega-3 fatty acids 1000 MG capsule Take 1 g by mouth daily.     Flaxseed, Linseed, (FLAX SEED OIL) 1000 MG CAPS Take by mouth daily.     FLUoxetine  (PROZAC ) 10 MG capsule TAKE 1 CAPSULE BY MOUTH EVERY DAY 90 capsule 2   ibuprofen  (ADVIL ) 800 MG tablet TAKE 1 TABLET BY MOUTH EVERY 8 HOURS AS NEEDED 180 tablet 0   levothyroxine  (SYNTHROID ) 50 MCG tablet Take 1 tablet (50 mcg total) by mouth daily before breakfast. 30 tablet 0   magnesium  oxide (MAG-OX) 400 MG tablet Take 400 mg by mouth daily.     terbinafine  (LAMISIL ) 250 MG tablet Take 1 tablet (250 mg total) by mouth daily. 90 tablet 0   traZODone  (DESYREL ) 50 MG tablet Take 0.5-1 tablets (25-50 mg total) by mouth at bedtime as needed for sleep. 30 tablet 3   Turmeric 500 MG CAPS Take by mouth.     rosuvastatin  (CRESTOR ) 20 MG tablet TAKE 1 TABLET BY MOUTH EVERY DAY (Patient not taking: Reported on 09/14/2023) 90 tablet 1   No current facility-administered medications for this visit.   Allergies as of 09/14/2023 - Review Complete 09/14/2023  Allergen Reaction Noted   Amoxicillin   08/06/2011   Adhesive [tape] Rash 12/05/2014   Latex Rash 05/03/2016   Family History  Problem Relation Age  of Onset   Hypertension Mother    Diabetes Mother    Colon polyps Mother        precancerous   Heart attack Mother    Diverticulitis Mother    Breast cancer Mother    Hypertension Father    Stroke Father    Colon polyps Father    Melanoma Father    Diverticulitis Father    Hypertension Brother    Colon polyps Brother    Diabetes Brother    Melanoma Brother    Lupus Maternal Grandmother    Rheum arthritis Maternal Grandmother    Coronary artery disease Maternal Grandfather  Heart disease Maternal Grandfather    Basal cell carcinoma Maternal Grandfather    Colon cancer Neg Hx    Stomach cancer Neg Hx    Esophageal cancer Neg Hx    Rectal cancer Neg Hx    Review of Systems:    Constitutional: No weight loss, fever, chills, weakness or fatigue HEENT: Eyes: No change in vision               Ears, Nose, Throat:  No change in hearing or congestion Skin: No rash or itching Cardiovascular: No chest pain, chest pressure or palpitations   Respiratory: No SOB or cough Gastrointestinal: See HPI and otherwise negative Genitourinary: No dysuria or change in urinary frequency Neurological: No headache, dizziness or syncope Musculoskeletal: No new muscle or joint pain Hematologic: No bleeding or bruising Psychiatric: No history of depression or anxiety   Physical Exam:  Vital signs: BP 110/60 (Cuff Size: Normal)   Pulse 71   Ht 5' 6 (1.676 m)   Wt 144 lb (65.3 kg)   BMI 23.24 kg/m   Constitutional: Pleasant Caucasian female appears to be in NAD, Well developed, Well nourished, alert and cooperative Neck:  Supple Throat: Oral cavity and pharynx without inflammation, swelling or lesion.  Respiratory: Respirations even and unlabored. Lungs clear to auscultation bilaterally.   No wheezes, crackles, or rhonchi.  Cardiovascular: Normal S1, S2. Regular rate and rhythm. No peripheral edema, cyanosis or pallor.  Gastrointestinal:  Soft, nondistended, mild abdominal tenderness in left  lower quadrant and above umbilicus with palpation. No rebound or guarding. Hyperactive bowel sounds. No appreciable masses or hepatomegaly. Rectal:  Not performed.  Skin:   Dry and intact without significant lesions or rashes. Psychiatric: Oriented to person, place and time. Demonstrates good judgement and reason without abnormal affect or behaviors.  RELEVANT LABS AND IMAGING: CBC  Lab Results  Component Value Date   WBC 6.3 07/25/2023   HGB 14.2 07/25/2023   HCT 42.8 07/25/2023   MCV 99.0 07/25/2023   PLT 277.0 07/25/2023    CMP     Latest Ref Rng & Units 07/25/2023    2:14 PM 09/07/2022    8:21 AM 07/19/2022    9:02 AM  CMP  Glucose 70 - 99 mg/dL 95   896   BUN 6 - 23 mg/dL 18   22   Creatinine 9.59 - 1.20 mg/dL 9.16   9.17   Sodium 864 - 145 mEq/L 139   140   Potassium 3.5 - 5.1 mEq/L 4.4   4.0   Chloride 96 - 112 mEq/L 102   103   CO2 19 - 32 mEq/L 28   26   Calcium  8.4 - 10.5 mg/dL 9.6   9.8   Total Protein 6.0 - 8.3 g/dL 7.1  6.5  7.1   Total Bilirubin 0.2 - 1.2 mg/dL 0.9  0.8  0.5   Alkaline Phos 39 - 117 U/L 46  35  39   AST 0 - 37 U/L 27  30  20    ALT 0 - 35 U/L 26  26  19      Lab Results  Component Value Date   TSH 1.65 08/26/2023    08/25/2023 US  Abd complete IMPRESSION: 1. No acute process. 2. Status post cholecystectomy.  09/20/21 MRCP IMPRESSION: Prior cholecystectomy. No evidence of biliary ductal dilatation or other significant abnormality. No evidence of cystic lesion or abnormal fluid collection in the porta hepatis.  09/04/21 US  Abd complete IMPRESSION: 1. A well-circumscribed 2.6  x 1.8 cm fluid collection near the porta hepatis could represent focally dilated common bile duct, an indeterminate fluid collection, or even fluid-filled duodenum. An MRCP could better evaluate whether this represents common bile duct. 2. No other abnormalities.  10/13/2021 EGD Impression:  - Esophagogastric landmarks identified.  - Benign small gastric inlet  patch in the proximal esophagus  - Normal esophagus otherwise  - A single gastric polyp, suspect benign fundic gland polyp. Biopsied.  - Normal stomach otherwise. Biopsies taken to rule out H pylori  - Normal duodenal bulb and second portion of the duodenum.  No overt cause for pain on this exam, will await biopsy results. Unclear if this could be musculoskeletal in etiology? Path: Diagnosis 1. Surgical [P], gastric - ANTRAL AND OXYNTIC MUCOSA WITH HYPEREMIA. - NO HELICOBACTER PYLORI IDENTIFIED. 2. Surgical [P], gastric polyp - FUNDIC GLAND POLYPS.  08/04/2015 colonoscopy, recall 10 years Normal colonoscopy, excellent prep  08/22/2006 colonoscopy No polyps or cancer seen. Small external hemorrhoids in anal canal. Excellent prep.  Assessment: Encounter Diagnoses  Name Primary?   RUQ pain Yes   Irritable bowel syndrome with diarrhea    Gastroesophageal reflux disease without esophagitis     63 year female patient with chronic intermittent RUQ pain that radiates down to her lower abdomen. It is reassuring with normal Abd U/S, MRCP (2023), EGD with biopsy (2023), and UTD colonoscopy. As we discussed her bowel habits she does admit her diarrhea has recently increased in frequency and severity and the pain can be brought on before she has a bowel movement which is the Rome Criteria IV for IBS. I would like to trial antispasmodic before meals as needed as well as provide Low FOD map diet to see if this will improve her bowel habits and the pain. If no improvement we can also consider cholestyramine for bile salt malabsorption given her symptoms originated after her gallbladder surgery.  Plan: -Low FOD map diet pamphlet handed out -Prescribe dicyclomine  20mg  before meals once to three times daily  -Discontinue Nexium  40 mg po daily, switch to dexlansoprazole  60 mg po daily  -GERD diet, no late meals -Recall colonoscopy 07/2025 -Follow-up with Cathryne, NP in 2 months  Adrinne Sze,  FNP-C [facility] 09/14/2023, 10:36 AM  Cc: Mahlon Comer BRAVO, MD

## 2023-09-14 NOTE — Telephone Encounter (Signed)
 Will send to the prior authorization team to see if they can get it for her.

## 2023-09-14 NOTE — Patient Instructions (Signed)
 We are providing you with a FODMAP to read and follow.  We have sent the following medications to your pharmacy for you to pick up at your convenience: Generic Bentyl , and generic Dexilant   _______________________________________________________  If your blood pressure at your visit was 140/90 or greater, please contact your primary care physician to follow up on this.  _______________________________________________________  If you are age 61 or older, your body mass index should be between 23-30. Your Body mass index is 23.24 kg/m. If this is out of the aforementioned range listed, please consider follow up with your Primary Care Provider.  If you are age 20 or younger, your body mass index should be between 19-25. Your Body mass index is 23.24 kg/m. If this is out of the aformentioned range listed, please consider follow up with your Primary Care Provider.   ________________________________________________________  The [facility] providers would like to encourage you to use MYCHART to communicate with providers for non-urgent requests or questions.  Due to long hold times on the telephone, sending your provider a message by [facility] may be a faster and more efficient way to get a response.  Please allow 48 business hours for a response.  Please remember that this is for non-urgent requests.  _______________________________________________________  I appreciate the opportunity to care for you. Lupita Commander, MD, [facility] Deanna May, NP-C

## 2023-09-14 NOTE — Progress Notes (Signed)
 I saw the patient with Robin Mueller and agree with her evaluation and plans.  We suspect IBS.  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  Trial of dicyclomine  and see if switching to dexlansoprazole  is possible in her case.  Neck step likely would be a trial of cholestyramine depending upon response to the above.  Also consider eliminating flaxseed oil capsules and fish oil capsules depending upon clinical course.

## 2023-09-15 ENCOUNTER — Encounter: Payer: Self-pay | Admitting: Family Medicine

## 2023-09-19 ENCOUNTER — Other Ambulatory Visit: Payer: Self-pay | Admitting: Family Medicine

## 2023-09-19 DIAGNOSIS — E039 Hypothyroidism, unspecified: Secondary | ICD-10-CM

## 2023-09-22 ENCOUNTER — Other Ambulatory Visit: Payer: Self-pay | Admitting: Family Medicine

## 2023-09-22 ENCOUNTER — Other Ambulatory Visit: Payer: Self-pay | Admitting: Gastroenterology

## 2023-09-27 ENCOUNTER — Encounter: Payer: Self-pay | Admitting: Family Medicine

## 2023-09-27 ENCOUNTER — Ambulatory Visit: Payer: 59 | Admitting: Family Medicine

## 2023-09-27 VITALS — BP 108/68 | HR 53 | Temp 97.7°F | Ht 66.0 in | Wt 144.1 lb

## 2023-09-27 DIAGNOSIS — E041 Nontoxic single thyroid nodule: Secondary | ICD-10-CM | POA: Diagnosis not present

## 2023-09-27 LAB — T3, FREE: T3, Free: 3.5 pg/mL (ref 2.3–4.2)

## 2023-09-27 LAB — T4, FREE: Free T4: 1.38 ng/dL (ref 0.60–1.60)

## 2023-09-27 LAB — TSH: TSH: 0.89 u[IU]/mL (ref 0.35–5.50)

## 2023-09-27 NOTE — Progress Notes (Signed)
   Subjective:    Patient ID: Robin Mueller, female    DOB: [birth year], 63 y.o.   MRN: [id number]  HPI Thyroid nodule- saw Dermatology in December and was told she had a thyroid nodule.  She had just recently started thyroid medication.  Pt has not noticed a change.  No trouble swallowing.     Review of Systems For ROS see HPI     Objective:   Physical Exam Vitals reviewed.  Constitutional:      General: She is not in acute distress.    Appearance: Normal appearance. She is not ill-appearing.  HENT:     Head: Normocephalic and atraumatic.  Neck:     Thyroid: No thyroid mass, thyromegaly or thyroid tenderness.     Trachea: Trachea normal.  Musculoskeletal:     Cervical back: Normal range of motion and neck supple.  Lymphadenopathy:     Cervical: No cervical adenopathy.  Neurological:     Mental Status: She is alert.           Assessment & Plan:   Thyroid nodule- new.  Pt reports Dermatology told her she had a thyroid nodule but did not discuss any f/u.  I do not feel any nodule on exam but will get [place] to assess.  Will repeat TSH and get free T3/free T4 since Levothyroxine manufacturer was changed.  Pt expressed understanding and is in agreement w/ plan.

## 2023-09-27 NOTE — Patient Instructions (Signed)
Follow up as needed or as scheduled We'll call you to schedule your ultrasound appt Your thyroid numbers look great!  No med changes at this time Call with any questions or concerns Stay Safe!  Stay Healthy! Happy New Year!

## 2023-09-28 ENCOUNTER — Telehealth: Payer: Self-pay

## 2023-09-28 NOTE — Telephone Encounter (Signed)
-----   Message from [name] sent at 09/27/2023  6:56 PM EST ----- Labs look great!  No med changes at this time

## 2023-09-28 NOTE — Telephone Encounter (Signed)
Pt has reviewed labs via MyChart

## 2023-09-30 ENCOUNTER — Ambulatory Visit (HOSPITAL_BASED_OUTPATIENT_CLINIC_OR_DEPARTMENT_OTHER)
Admission: RE | Admit: 2023-09-30 | Discharge: 2023-09-30 | Disposition: A | Discharge status: Home / Self Care | Payer: 59 | Source: Ambulatory Visit | Attending: Family Medicine | Admitting: Family Medicine

## 2023-09-30 DIAGNOSIS — E041 Nontoxic single thyroid nodule: Secondary | ICD-10-CM | POA: Insufficient documentation

## 2023-10-05 ENCOUNTER — Encounter: Payer: Self-pay | Admitting: Family Medicine

## 2023-10-07 ENCOUNTER — Other Ambulatory Visit (HOSPITAL_COMMUNITY): Payer: Self-pay

## 2023-10-07 ENCOUNTER — Encounter: Payer: Self-pay | Admitting: Family Medicine

## 2023-10-07 ENCOUNTER — Telehealth: Payer: Self-pay

## 2023-10-07 NOTE — Telephone Encounter (Signed)
When doing a thyroid ultrasound, they have very specific criteria that they use when determining incidental (just happen to be there) nodules vs nodules of concern.  That is their way of saying, yes- there is a nodule, but it's normal.  Hope this helps!

## 2023-10-07 NOTE — Telephone Encounter (Signed)
-----   Message from [name] sent at 10/07/2023  7:45 AM EST ----- Essentially normal ultrasound- great news!  Incidental 1cm nodule that doesn't meet follow up criteria- great news!

## 2023-10-07 NOTE — Telephone Encounter (Signed)
Patient has been informed and notes no concerns she just wanted to be sure she understood!

## 2023-10-17 ENCOUNTER — Other Ambulatory Visit: Payer: Self-pay | Admitting: Family Medicine

## 2023-10-18 MED ORDER — IBUPROFEN 800 MG PO TABS: 800.0000 mg | ORAL_TABLET | Freq: Three times a day (TID) | ORAL | 0 refills | Status: AC | PRN

## 2023-10-18 NOTE — Telephone Encounter (Signed)
Requested Prescriptions   Pending Prescriptions Disp Refills   ibuprofen (ADVIL) 800 MG tablet 180 tablet 0    Sig: Take 1 tablet (800 mg total) by mouth every 8 (eight) hours as needed.     Date of patient request: 10/18/2023 Last office visit: 09/27/2023 Upcoming visit: 01/23/2024 Date of last refill: 01/01/2019 Last refill amount: 180

## 2023-10-20 ENCOUNTER — Other Ambulatory Visit (HOSPITAL_COMMUNITY): Payer: Self-pay

## 2023-10-28 ENCOUNTER — Other Ambulatory Visit (HOSPITAL_COMMUNITY): Payer: Self-pay

## 2023-10-28 ENCOUNTER — Telehealth: Payer: Self-pay

## 2023-10-28 NOTE — Telephone Encounter (Signed)
Pharmacy Patient Advocate Encounter  Received notification from [facility] that Prior Authorization for Dexlansoprazole 60mg   has been DENIED.  Full denial letter will be uploaded to the media tab. See denial reason below.   PA #/Case ID/Reference #: Your plan only covers this drug when you meet one of these options: A) You have tried other drugs your plan covers (preferred drugs), and they did not work well for you, or B) Your doctor gives [place] a medical reason you cannot take those other drugs. For your plan, you may need to try up to three preferred drugs. We have denied your request because you do not meet any of these conditions. We reviewed the information we had. Your request has been denied. Your doctor can send [place] any new or missing information for [place] to review. The preferred drugs for your plan are: esomeprazole delayed-rel, lansoprazole delayed-rel capsule, omeprazole delayed-rel, pantoprazole delayed-rel tablet. (Requirement: 3 in a class with 3 or more alternatives, 2 in a class with 2 alternatives, or 1 in a class with only 1 alternative.).

## 2023-10-28 NOTE — Telephone Encounter (Signed)
*  Gastro  Pharmacy Patient Advocate Encounter   Received notification from RX Request Messages that prior authorization for Dexlansoprazole 60MG  dr capsules  is required/requested.   Insurance verification completed.   The patient is insured through [facility] .   Per test claim: PA required; PA submitted to above mentioned insurance via CoverMyMeds Key/confirmation #/EOC [id number] Status is pending

## 2023-10-28 NOTE — Telephone Encounter (Signed)
PA request has been Submitted. New Encounter created for follow up. For additional info see Pharmacy Prior Auth telephone encounter from 02/21.

## 2023-10-31 MED ORDER — LANSOPRAZOLE 30 MG PO CPDR: 30.0000 mg | DELAYED_RELEASE_CAPSULE | Freq: Every day | ORAL | 0 refills | Status: DC

## 2023-10-31 NOTE — Addendum Note (Signed)
 Addended by: [place], Dajohn Ellender E on: 10/31/2023 04:57 PM   Modules accepted: Orders

## 2023-10-31 NOTE — Telephone Encounter (Signed)
 The Dexilant sent in January has been denied after the prior authorization was done. Please advise if we can send in one of the other choices. Thank you.

## 2023-11-07 ENCOUNTER — Telehealth: Payer: Self-pay

## 2023-11-07 NOTE — Telephone Encounter (Signed)
 This has been taken care of, patient cancelled she is going to be out of town.

## 2023-11-07 NOTE — Telephone Encounter (Signed)
 Copied from CRM [id number]. Topic: General - Call Back - No Documentation >> Nov 07, 2023  9:49 AM Irine Seal wrote: Reason for CRM: patient returning missed call from vanessa cb# [phone]

## 2023-11-08 ENCOUNTER — Ambulatory Visit: Admitting: Family Medicine

## 2023-11-14 ENCOUNTER — Other Ambulatory Visit: Payer: Self-pay | Admitting: Family Medicine

## 2023-11-22 ENCOUNTER — Other Ambulatory Visit: Payer: Self-pay | Admitting: Gastroenterology

## 2023-11-30 LAB — HM MAMMOGRAPHY

## 2023-12-02 ENCOUNTER — Encounter: Payer: Self-pay | Admitting: Family Medicine

## 2023-12-12 ENCOUNTER — Other Ambulatory Visit: Payer: Self-pay | Admitting: Gastroenterology

## 2023-12-29 DIAGNOSIS — S76111A Strain of right quadriceps muscle, fascia and tendon, initial encounter: Secondary | ICD-10-CM | POA: Insufficient documentation

## 2024-01-19 DIAGNOSIS — M542 Cervicalgia: Secondary | ICD-10-CM | POA: Insufficient documentation

## 2024-01-23 ENCOUNTER — Ambulatory Visit: Payer: BC Managed Care – PPO | Admitting: Family Medicine

## 2024-01-27 ENCOUNTER — Ambulatory Visit: Admitting: Family Medicine

## 2024-01-27 ENCOUNTER — Encounter: Payer: Self-pay | Admitting: Family Medicine

## 2024-01-27 VITALS — BP 116/70 | HR 68 | Temp 98.0°F | Ht 66.0 in | Wt 135.0 lb

## 2024-01-27 DIAGNOSIS — E785 Hyperlipidemia, unspecified: Secondary | ICD-10-CM

## 2024-01-27 DIAGNOSIS — E039 Hypothyroidism, unspecified: Secondary | ICD-10-CM | POA: Diagnosis not present

## 2024-01-27 LAB — LIPID PANEL
Cholesterol: 156 mg/dL (ref 0–200)
HDL: 62.6 mg/dL (ref >39.00)
LDL Cholesterol: 52 mg/dL (ref 0–99)
NonHDL: 93.73
Total CHOL/HDL Ratio: 2
Triglycerides: 208 mg/dL — ABNORMAL HIGH (ref 0.0–149.0)
VLDL: 41.6 mg/dL — ABNORMAL HIGH (ref 0.0–40.0)

## 2024-01-27 LAB — BASIC METABOLIC PANEL WITH GFR
BUN: 16 mg/dL (ref 6–23)
CO2: 31 meq/L (ref 19–32)
Calcium: 8.9 mg/dL (ref 8.4–10.5)
Chloride: 102 meq/L (ref 96–112)
Creatinine, Ser: 0.89 mg/dL (ref 0.40–1.20)
GFR: 70.17 mL/min (ref >60.00)
Glucose, Bld: 107 mg/dL — ABNORMAL HIGH (ref 70–99)
Potassium: 3.9 meq/L (ref 3.5–5.1)
Sodium: 140 meq/L (ref 135–145)

## 2024-01-27 LAB — HEPATIC FUNCTION PANEL
ALT: 26 U/L (ref 0–35)
AST: 20 U/L (ref 0–37)
Albumin: 4.2 g/dL (ref 3.5–5.2)
Alkaline Phosphatase: 46 U/L (ref 39–117)
Bilirubin, Direct: 0.1 mg/dL (ref 0.0–0.3)
Total Bilirubin: 0.5 mg/dL (ref 0.2–1.2)
Total Protein: 6.3 g/dL (ref 6.0–8.3)

## 2024-01-27 LAB — CBC WITH DIFFERENTIAL/PLATELET
Basophils Absolute: 0 10*3/uL (ref 0.0–0.1)
Basophils Relative: 0.3 % (ref 0.0–3.0)
Eosinophils Absolute: 0.1 10*3/uL (ref 0.0–0.7)
Eosinophils Relative: 1.3 % (ref 0.0–5.0)
HCT: 37.3 % (ref 36.0–46.0)
Hemoglobin: 12.7 g/dL (ref 12.0–15.0)
Lymphocytes Relative: 39.6 % (ref 12.0–46.0)
Lymphs Abs: 3.9 10*3/uL (ref 0.7–4.0)
MCHC: 34.2 g/dL (ref 30.0–36.0)
MCV: 96.4 fl (ref 78.0–100.0)
Monocytes Absolute: 0.9 10*3/uL (ref 0.1–1.0)
Monocytes Relative: 9.6 % (ref 3.0–12.0)
Neutro Abs: 4.8 10*3/uL (ref 1.4–7.7)
Neutrophils Relative %: 49.2 % (ref 43.0–77.0)
Platelets: 355 10*3/uL (ref 150.0–400.0)
RBC: 3.87 Mil/uL (ref 3.87–5.11)
RDW: 13.6 % (ref 11.5–15.5)
WBC: 9.8 10*3/uL (ref 4.0–10.5)

## 2024-01-27 LAB — TSH: TSH: 3.35 u[IU]/mL (ref 0.35–5.50)

## 2024-01-27 NOTE — Progress Notes (Signed)
   Subjective:    Patient ID: Robin Mueller, female    DOB: [birth year], 63 y.o.   MRN: [id number]  HPI Hyperlipidemia- chronic problem, on Crestor  20mg  daily.  Down 9 lbs.  No CP, SOB, abd pain, N/V.  Hypothyroid- ongoing issue.  Currently on Levothyroxine  50mcg daily.  Started seeing Endocrinologist who put her on Cytomel 5mcg q3 days.  Pt reports feeling much better- down 9 lbs, mood better.   Review of Systems For ROS see HPI     Objective:   Physical Exam Vitals reviewed.  Constitutional:      General: She is not in acute distress.    Appearance: Normal appearance. She is well-developed.  HENT:     Head: Normocephalic and atraumatic.  Eyes:     Conjunctiva/sclera: Conjunctivae normal.     Pupils: Pupils are equal, round, and reactive to light.  Neck:     Thyroid : No thyromegaly.  Cardiovascular:     Rate and Rhythm: Normal rate and regular rhythm.     Pulses: Normal pulses.     Heart sounds: Normal heart sounds. No murmur heard. Pulmonary:     Effort: Pulmonary effort is normal. No respiratory distress.     Breath sounds: Normal breath sounds.  Abdominal:     General: There is no distension.     Palpations: Abdomen is soft.     Tenderness: There is no abdominal tenderness.  Musculoskeletal:     Cervical back: Normal range of motion and neck supple.     Right lower leg: No edema.     Left lower leg: No edema.  Lymphadenopathy:     Cervical: No cervical adenopathy.  Skin:    General: Skin is warm and dry.  Neurological:     General: No focal deficit present.     Mental Status: She is alert and oriented to person, place, and time. Mental status is at baseline.  Psychiatric:        Mood and Affect: Mood normal.        Behavior: Behavior normal.        Thought Content: Thought content normal.           Assessment & Plan:

## 2024-01-27 NOTE — Patient Instructions (Signed)
 Schedule your complete physical in 6 months We'll notify you of your lab results and make any changes if needed Keep up the good work on healthy diet and regular exercise- you look great!!! Call with any questions or concerns Stay Safe!  Stay Healthy! Have a great summer! Happy [place] Birthday!!!

## 2024-01-27 NOTE — Assessment & Plan Note (Signed)
 Chronic problem.  On Crestor  20mg  daily w/o difficulty.  Down 9 lbs since last visit.  Check labs.  Adjust meds prn.

## 2024-01-27 NOTE — Assessment & Plan Note (Addendum)
 Chronic problem, on Levothyroxine  50mcg, Cytomel 5mcg q3 days.  Reports feeling much better w/ medication changes.  Will defer to Endo

## 2024-01-31 ENCOUNTER — Ambulatory Visit: Payer: Self-pay | Admitting: Family Medicine

## 2024-02-12 ENCOUNTER — Other Ambulatory Visit: Payer: Self-pay | Admitting: Family Medicine

## 2024-03-10 ENCOUNTER — Other Ambulatory Visit: Payer: Self-pay | Admitting: Family Medicine

## 2024-03-29 ENCOUNTER — Other Ambulatory Visit: Payer: Self-pay | Admitting: Gastroenterology

## 2024-03-29 ENCOUNTER — Other Ambulatory Visit: Payer: Self-pay | Admitting: Family Medicine

## 2024-04-13 ENCOUNTER — Encounter: Payer: Self-pay | Admitting: Family Medicine

## 2024-04-24 ENCOUNTER — Ambulatory Visit: Admitting: Family Medicine

## 2024-04-24 ENCOUNTER — Encounter: Payer: Self-pay | Admitting: Family Medicine

## 2024-04-24 VITALS — BP 108/68 | HR 55 | Temp 97.8°F | Ht 66.0 in | Wt 140.2 lb

## 2024-04-24 DIAGNOSIS — B029 Zoster without complications: Secondary | ICD-10-CM

## 2024-04-24 NOTE — Progress Notes (Signed)
   Subjective:    Patient ID: NILZA EAKER, female    DOB: [birth year], 63 y.o.   MRN: [id number]  HPI Skin lesion- Rt thigh.  Sxs started as a 'small circle that peeled off'  Then developed 2 pimple like lesions that coalesced.  Has some itching and some burning.  Has not spread.  Pt has had shingles vaccine x2.  Stress level has been very high.   Review of Systems For ROS see HPI     Objective:   Physical Exam Vitals reviewed.  Constitutional:      General: She is not in acute distress.    Appearance: Normal appearance. She is not ill-appearing.  HENT:     Head: Normocephalic and atraumatic.  Skin:    General: Skin is warm and dry.     Findings: Lesion (R anterior thigh, ~1 cm, pearly scarred appearance) present.  Neurological:     General: No focal deficit present.     Mental Status: She is alert and oriented to person, place, and time.  Psychiatric:        Mood and Affect: Mood normal.        Behavior: Behavior normal.        Thought Content: Thought content normal.           Assessment & Plan:  Shingles- new.  Pt appears to have had very small cluster of vesicles that had some mild itching and burning which would be consistent w/ shingles in a vaccinated person.  Discussed that this should resolve w/ time but may leave a scar.  If area in question doesn't heal by next visit, will refer to Derm for complete evaluation.  Pt expressed understanding and is in agreement w/ plan.

## 2024-04-24 NOTE — Patient Instructions (Signed)
 Schedule your complete physical for November This appears to be shingles and is healing It can take a few months for the lesions to completely resolve but it may leave a scar Call with any questions or concerns Hang in there! GOOD LUCK WITH BACK TO SCHOOL!!!

## 2024-05-02 DIAGNOSIS — Z9889 Other specified postprocedural states: Secondary | ICD-10-CM | POA: Insufficient documentation

## 2024-05-09 ENCOUNTER — Encounter: Payer: Self-pay | Admitting: Family Medicine

## 2024-05-09 NOTE — Telephone Encounter (Signed)
 Patient sent message in about a bump or place on her shoulder. Please see picture attached. Would you like for patient to come in?

## 2024-05-22 ENCOUNTER — Ambulatory Visit: Admitting: Family Medicine

## 2024-05-22 ENCOUNTER — Encounter: Payer: Self-pay | Admitting: Family Medicine

## 2024-05-22 VITALS — BP 118/68 | HR 65 | Temp 98.0°F | Ht 66.0 in | Wt 141.0 lb

## 2024-05-22 DIAGNOSIS — J019 Acute sinusitis, unspecified: Secondary | ICD-10-CM

## 2024-05-22 DIAGNOSIS — B9689 Other specified bacterial agents as the cause of diseases classified elsewhere: Secondary | ICD-10-CM

## 2024-05-22 MED ORDER — DOXYCYCLINE HYCLATE 100 MG PO TABS
100.0000 mg | ORAL_TABLET | Freq: Two times a day (BID) | ORAL | 0 refills | Status: DC
Start: 2024-05-22 — End: 2024-06-12

## 2024-05-22 NOTE — Progress Notes (Signed)
   Subjective:    Patient ID: Robin Mueller, female    DOB: 03/09/1961, 63 y.o.   MRN: [id number]  HPI URI- 'my typical fall sinus infxn'.  Sxs started over a week ago.  No fever.  + HA, facial pain, sore throat.  Nasal drainage is green and bloody.  Bilateral ear fullness.  Cough from drainage.  No improvement w/ Mucinex or allergy  medication.   Review of Systems For ROS see HPI     Objective:   Physical Exam Vitals reviewed.  Constitutional:      General: She is not in acute distress.    Appearance: Normal appearance. She is well-developed. She is not ill-appearing.  HENT:     Head: Normocephalic and atraumatic.     Right Ear: Tympanic membrane normal.     Left Ear: Tympanic membrane normal.     Nose: Mucosal edema and congestion present. No rhinorrhea.     Right Sinus: Maxillary sinus tenderness and frontal sinus tenderness present.     Left Sinus: Maxillary sinus tenderness and frontal sinus tenderness present.     Mouth/Throat:     Pharynx: Uvula midline. Posterior oropharyngeal erythema present. No oropharyngeal exudate.  Eyes:     Conjunctiva/sclera: Conjunctivae normal.     Pupils: Pupils are equal, round, and reactive to light.  Cardiovascular:     Rate and Rhythm: Normal rate and regular rhythm.     Heart sounds: Normal heart sounds.  Pulmonary:     Effort: Pulmonary effort is normal. No respiratory distress.     Breath sounds: Normal breath sounds. No wheezing.  Musculoskeletal:     Cervical back: Normal range of motion and neck supple.  Lymphadenopathy:     Cervical: No cervical adenopathy.  Skin:    General: Skin is warm and dry.  Neurological:     General: No focal deficit present.     Mental Status: She is alert and oriented to person, place, and time.     Cranial Nerves: No cranial nerve deficit.     Motor: No weakness.     Coordination: Coordination normal.  Psychiatric:        Mood and Affect: Mood normal.        Behavior: Behavior normal.         Thought Content: Thought content normal.           Assessment & Plan:  Bacterial sinusitis- pt has hx of similar.  Sxs and PE consistent w/ dx.  Start Doxycycline  BID.  Reviewed supportive care and red flags that should prompt return.  Pt expressed understanding and is in agreement w/ plan.

## 2024-05-22 NOTE — Patient Instructions (Signed)
Follow up as needed or as scheduled START the Doxycycline twice daily- take w/ food Drink LOTS of fluids REST! Call with any questions or concerns Hang in there!!! 

## 2024-05-23 ENCOUNTER — Ambulatory Visit: Admitting: Family Medicine

## 2024-05-29 ENCOUNTER — Encounter: Payer: Self-pay | Admitting: Family Medicine

## 2024-05-29 NOTE — Telephone Encounter (Signed)
 Patient was seen 05/22/24 for URI. Please advise, thank you

## 2024-06-06 ENCOUNTER — Encounter (INDEPENDENT_AMBULATORY_CARE_PROVIDER_SITE_OTHER): Payer: Self-pay | Admitting: Family Medicine

## 2024-06-06 DIAGNOSIS — J019 Acute sinusitis, unspecified: Secondary | ICD-10-CM

## 2024-06-06 MED ORDER — PREDNISONE 10 MG PO TABS: ORAL_TABLET | ORAL | 0 refills | Status: DC

## 2024-06-06 NOTE — Telephone Encounter (Signed)
 Copied from CRM #[id number]. Topic: Appointments - Scheduling Inquiry for Clinic >> Jun 06, 2024  9:31 AM Timindy P wrote: Reason for CRM: Patient called to schedule an appointment with Dr. Mahlon for a returning sinus infection she has been battling for awhile. Scheduled the soonest available appt which was 06/08/2024. Pt did not wish to see any other provider in the office for this visit. Sending as an FYI, pt reports no emergent/urgent symptoms.

## 2024-06-06 NOTE — Telephone Encounter (Signed)
 FYI

## 2024-06-06 NOTE — Telephone Encounter (Signed)
 [facility] VISIT   Patient agreed to [facility] visit and is aware that copayment and coinsurance may apply. Patient was treated using telemedicine according to accepted telemedicine protocols.  Subjective:   Patient complains of sinusitis  Patient Active Problem List   Diagnosis Date Noted   Hypothyroid 01/27/2024   Adjustment disorder with mixed anxiety and depressed mood 04/12/2022   Osteoarthritis of knee 02/23/2022   Hyperlipidemia 07/21/2021   Latex allergy  05/31/2016   Hemorrhoids, internal, with bleeding 06/03/2015   Decreased hearing of both ears 01/20/2015   RLS (restless legs syndrome) 07/14/2014   Physical exam 10/19/2011   Plantar fasciitis 10/19/2011   Hand pain 05/26/2011   Seasonal and perennial allergic rhinitis 04/09/2009   ANXIETY DEPRESSION 02/26/2009   GERD 10/02/2008   IBS 10/02/2008   Social History   Tobacco Use   Smoking status: Former    Current packs/day: 0.00    Types: Cigarettes    Quit date: 09/06/1998    Years since quitting: 25.7   Smokeless tobacco: Never  Substance Use Topics   Alcohol use: Not Currently    Comment: occ    Current Outpatient Medications:    predniSONE  (DELTASONE ) 10 MG tablet, 3 tabs x3 days and then 2 tabs x3 days and then 1 tab x3 days.  Take w/ food., Disp: 18 tablet, Rfl: 0   Ascorbic Acid (VITAMIN C) 100 MG CHEW, Chew by mouth daily., Disp: , Rfl:    Calcium  600-200 MG-UNIT per tablet, Take 1 tablet by mouth daily., Disp: , Rfl:    doxycycline  (VIBRA -TABS) 100 MG tablet, Take 1 tablet (100 mg total) by mouth 2 (two) times daily., Disp: 20 tablet, Rfl: 0   fish oil-omega-3 fatty acids 1000 MG capsule, Take 1 g by mouth daily., Disp: , Rfl:    Flaxseed, Linseed, (FLAX SEED OIL) 1000 MG CAPS, Take by mouth daily., Disp: , Rfl:    FLUoxetine  (PROZAC ) 10 MG capsule, TAKE 1 CAPSULE BY MOUTH EVERY DAY, Disp: 90 capsule, Rfl: 2   ibuprofen  (ADVIL ) 800 MG tablet, Take 1 tablet (800 mg total) by mouth every 8 (eight) hours as  needed., Disp: 180 tablet, Rfl: 0   lansoprazole  (PREVACID ) 30 MG capsule, TAKE 1 CAPSULE BY MOUTH ONCE DAILY. TAKE AT LEAST 4 HOURS AFTER LEVOTHYROXINE  OR TAKE AT BEDTIME., Disp: 90 capsule, Rfl: 1   levothyroxine  (SYNTHROID ) 50 MCG tablet, TAKE 1 TABLET BY MOUTH DAILY BEFORE BREAKFAST, Disp: 90 tablet, Rfl: 1   liothyronine (CYTOMEL) 5 MCG tablet, Take 5 mcg by mouth every 3 (three) days., Disp: , Rfl:    magnesium  oxide (MAG-OX) 400 MG tablet, Take 400 mg by mouth daily., Disp: , Rfl:    rosuvastatin  (CRESTOR ) 20 MG tablet, TAKE 1 TABLET BY MOUTH EVERY DAY, Disp: 90 tablet, Rfl: 1   terbinafine  (LAMISIL ) 250 MG tablet, Take 1 tablet (250 mg total) by mouth daily., Disp: 90 tablet, Rfl: 0   traZODone  (DESYREL ) 50 MG tablet, TAKE 0.5-1 TABLETS BY MOUTH AT BEDTIME AS NEEDED FOR SLEEP., Disp: 30 tablet, Rfl: 3   Turmeric 500 MG CAPS, Take by mouth., Disp: , Rfl:   Allergies  Allergen Reactions   Amoxicillin      Diarrhea    Adhesive [Tape] Rash   Latex Rash    She says she has a mild reaction to this    Assessment and Plan:   Diagnosis: sinusitis. Please see myChart communication and orders below.   No orders of the defined types were placed in this encounter.  Meds ordered this encounter  Medications   predniSONE  (DELTASONE ) 10 MG tablet    Sig: 3 tabs x3 days and then 2 tabs x3 days and then 1 tab x3 days.  Take w/ food.    Dispense:  18 tablet    Refill:  0    Comer Greet, MD 06/06/2024  A total of 6 minutes were spent by me to personally review the patient-generated inquiry, review patient records and data pertinent to assessment of the patient's problem, develop a management plan including generation of prescriptions and/or orders, and on subsequent communication with the patient through secure the MyChart portal service.   There is no separately reported E/M service related to this service in the past 7 days nor does the patient have an upcoming soonest available  appointment for this issue. This work was completed in less than 7 days.   The patient consented to this service today (see patient agreement prior to ongoing communication). Patient counseled regarding the need for in-person exam for certain conditions and was advised to call the office if any changing or worsening symptoms occur.   The codes to be used for the E/M service are: [x]   99421 for 5-10 minutes of time spent on the inquiry. []   [id number] for 11-20 minutes. []   [id number] for 21+ minutes.

## 2024-06-08 ENCOUNTER — Ambulatory Visit: Admitting: Family Medicine

## 2024-06-12 ENCOUNTER — Ambulatory Visit: Admitting: Family Medicine

## 2024-06-12 ENCOUNTER — Encounter: Payer: Self-pay | Admitting: Family Medicine

## 2024-06-12 VITALS — BP 108/64 | HR 61 | Temp 98.5°F | Ht 66.0 in | Wt 143.0 lb

## 2024-06-12 DIAGNOSIS — H66002 Acute suppurative otitis media without spontaneous rupture of ear drum, left ear: Secondary | ICD-10-CM | POA: Diagnosis not present

## 2024-06-12 DIAGNOSIS — J019 Acute sinusitis, unspecified: Secondary | ICD-10-CM | POA: Diagnosis not present

## 2024-06-12 DIAGNOSIS — B9689 Other specified bacterial agents as the cause of diseases classified elsewhere: Secondary | ICD-10-CM | POA: Diagnosis not present

## 2024-06-12 MED ORDER — SULFAMETHOXAZOLE-TRIMETHOPRIM 800-160 MG PO TABS: 1.0000 | ORAL_TABLET | Freq: Two times a day (BID) | ORAL | 0 refills | Status: DC

## 2024-06-12 NOTE — Progress Notes (Signed)
   Subjective:    Patient ID: Robin Mueller, female    DOB: 1961-06-16, 63 y.o.   MRN: [id number]  HPI Sinusitis- pt was tx'd w/ Doxycycline  on 9/16.  Sxs improved but she continued to have congestion and pressure so she started Prednisone  taper 10/1.  Pt reports prednisone  is helping but 'i think the infection is back'.  Pt reports R nasal drainage is thick, yellow, chunky, 'just nasty'.  Has tingling along cheek bone.  No tooth pain.  + L ear pain   Review of Systems For ROS see HPI     Objective:   Physical Exam Constitutional:      General: She is not in acute distress.    Appearance: She is well-developed.  HENT:     Head: Normocephalic and atraumatic.     Right Ear: Tympanic membrane normal.     Left Ear: A middle ear effusion is present. Tympanic membrane is erythematous.     Nose: Mucosal edema and congestion present. No rhinorrhea.     Right Sinus: Maxillary sinus tenderness present. No frontal sinus tenderness.     Left Sinus: Maxillary sinus tenderness present. No frontal sinus tenderness.     Mouth/Throat:     Pharynx: Uvula midline. Posterior oropharyngeal erythema present. No oropharyngeal exudate.  Eyes:     Conjunctiva/sclera: Conjunctivae normal.     Pupils: Pupils are equal, round, and reactive to light.  Cardiovascular:     Rate and Rhythm: Normal rate and regular rhythm.     Heart sounds: Normal heart sounds.  Pulmonary:     Effort: Pulmonary effort is normal. No respiratory distress.     Breath sounds: Normal breath sounds. No wheezing.  Musculoskeletal:     Cervical back: Normal range of motion and neck supple.  Lymphadenopathy:     Cervical: No cervical adenopathy.  Skin:    General: Skin is warm and dry.  Neurological:     General: No focal deficit present.     Mental Status: She is oriented to person, place, and time.     Cranial Nerves: No cranial nerve deficit.     Motor: No weakness.     Coordination: Coordination normal.  Psychiatric:         Mood and Affect: Mood normal.        Behavior: Behavior normal.        Thought Content: Thought content normal.           Assessment & Plan:  Bacterial sinusitis w/ L OM- new.  Pt's sxs had initially improved after Doxycycline  last month but pressure subsequently returned.  This mildly improved w/ Prednisone  taper but L ear became more painful, as did maxillary sinuses.  Start Bactrim  DS BID.  Reviewed supportive care and red flags that should prompt return.  Pt expressed understanding and is in agreement w/ plan.

## 2024-06-12 NOTE — Patient Instructions (Signed)
 Follow up as needed or as scheduled START the Bactrim  twice daily- take w/ food Drink LOTS of fluids REST! Call with any questions or concerns Hang in there!!!

## 2024-06-25 ENCOUNTER — Encounter: Payer: Self-pay | Admitting: Family Medicine

## 2024-06-25 ENCOUNTER — Ambulatory Visit: Payer: Self-pay

## 2024-06-25 ENCOUNTER — Telehealth: Admitting: Family Medicine

## 2024-06-25 VITALS — HR 70 | Temp 98.5°F | Wt 135.0 lb

## 2024-06-25 DIAGNOSIS — U071 COVID-19: Secondary | ICD-10-CM

## 2024-06-25 MED ORDER — MOLNUPIRAVIR EUA 200MG CAPSULE: 4.0000 | ORAL_CAPSULE | Freq: Two times a day (BID) | ORAL | 0 refills | Status: AC

## 2024-06-25 MED ORDER — NIRMATRELVIR/RITONAVIR (PAXLOVID)TABLET: 3.0000 | ORAL_TABLET | Freq: Two times a day (BID) | ORAL | 0 refills | Status: AC

## 2024-06-25 NOTE — Telephone Encounter (Signed)
 FYI Only or Action Required?: FYI only for provider.  Patient was last seen in primary care on 06/12/2024 by Mahlon Comer BRAVO, MD.  Called Nurse Triage reporting Covid Positive.  Symptoms began yesterday.  Interventions attempted: OTC medications: 800mg  advil .  Symptoms jmz:emnilrupcz cough with clear mucus, occasional ear aches bilateral, nasal congestion, body aches, fever, decreased appetite, headache, sore throat gradually worsening.  Triage Disposition: See HCP Within 4 Hours (Or PCP Triage)  Patient/caregiver understands and will follow disposition?: Yes Reason for Disposition . [1] Fever > 101 F (38.3 C) AND [2] age > 60 years  Answer Assessment - Initial Assessment Questions 1. SYMPTOMS: What is your main symptom or concern? (e.g., cough, fever, shortness of breath, muscle aches)     Nasal congestion, body aches.  2. ONSET: When did the symptoms start?      Yesterday morning, woke up with body aches.  3. COUGH: Do you have a cough? If Yes, ask: How bad is the cough?       Yes. Productive cough with clear mucus. Occasional.  4. FEVER: Do you have a fever? If Yes, ask: What is your temperature, how was it measured, and when did it start?     Yes, 101.3 taken today with oral thermometer. Fever started yesterday morning 100.7.  5. BREATHING DIFFICULTY: Are you having any difficulty breathing? (e.g., normal; shortness of breath, wheezing, unable to speak)      Normal.  6. BETTER-SAME-WORSE: Are you getting better, staying the same or getting worse compared to yesterday?  If getting worse, ask, In what way?     Worse. All symptoms.  7. OTHER SYMPTOMS: Do you have any other symptoms?  (e.g., chills, fatigue, headache, loss of smell or taste, muscle pain, sore throat)     Headache, sore throat, earaches (bilateral and occasional). She states for the last few months she has been on antibiotics, prednisone  for recurrent sinus infections. Decreased  appetite.  8. COVID-19 DIAGNOSIS: How do you know that you have COVID? (e.g., positive lab test or self-test, diagnosed by doctor or NP/PA, symptoms after exposure).     Positive home COVID test  yesterday.  9. COVID-19 EXPOSURE: Was there any known exposure to COVID before the symptoms began?      Works in [employer], exposure through work.  10. COVID-19 VACCINE: Have you had the COVID-19 vaccine? If Yes, ask: When did you last get it?       Yes, every year since 2021. Last dose 2024.  11. HIGH RISK DISEASE: Do you have any chronic medical problems? (e.g., asthma, heart or lung disease, weak immune system, obesity, etc.)       No.  12. PREGNANCY: Is there any chance you are pregnant? When was your last menstrual period?       N/A.  13. O2 SATURATION MONITOR:  Do you use an oxygen saturation monitor (pulse oximeter) at home? If Yes, ask What is your reading (oxygen level) today? What is your usual oxygen saturation reading? (e.g., 95%)       She thinks she may have one at home.  Patient states she is able to eat and drink.  Protocols used: COVID-19 - Diagnosed or Suspected-A-AH

## 2024-06-25 NOTE — Telephone Encounter (Signed)
 Copied from CRM #[id number]. Topic: Clinical - Medication Question >> Jun 25, 2024  8:59 AM Carlyon D wrote: Reason for CRM: Pt is calling in regards to testing positive for Covid yesterday 10/19.  She is asking If medication for Covid can be sent over to pharmacy for her.   Preferred pharmacy: [id number] [place], [place] - [address] [place] [address] Phone: [phone] Fax: [phone]

## 2024-06-25 NOTE — Telephone Encounter (Signed)
Patient responded.

## 2024-06-25 NOTE — Progress Notes (Signed)
 Virtual Visit via Video   I connected with patient on 06/25/24 at  2:40 PM EDT by a video enabled telemedicine application and verified that I am speaking with the correct person using two identifiers.  Location patient: Home Location provider: [facility], Office Persons participating in the virtual visit: Patient, Provider, CMA (Tonya H)  I discussed the limitations of evaluation and management by telemedicine and the availability of in person appointments. The patient expressed understanding and agreed to proceed.  Subjective:   HPI:   COVID- pt reports she woke up 'very achy' yesterday morning.  + nasal congestion, fever, HA.  + home test.  ROS:   See pertinent positives and negatives per HPI.  Patient Active Problem List   Diagnosis Date Noted   Hypothyroid 01/27/2024   Adjustment disorder with mixed anxiety and depressed mood 04/12/2022   Osteoarthritis of knee 02/23/2022   Hyperlipidemia 07/21/2021   Latex allergy  05/31/2016   Hemorrhoids, internal, with bleeding 06/03/2015   Decreased hearing of both ears 01/20/2015   RLS (restless legs syndrome) 07/14/2014   Physical exam 10/19/2011   Plantar fasciitis 10/19/2011   Hand pain 05/26/2011   Seasonal and perennial allergic rhinitis 04/09/2009   ANXIETY DEPRESSION 02/26/2009   GERD 10/02/2008   IBS 10/02/2008    Social History   Tobacco Use   Smoking status: Former    Current packs/day: 0.00    Types: Cigarettes    Quit date: 09/06/1998    Years since quitting: 25.8   Smokeless tobacco: Never  Substance Use Topics   Alcohol use: Not Currently    Comment: occ    Current Outpatient Medications:    Ascorbic Acid (VITAMIN C) 100 MG CHEW, Chew by mouth daily., Disp: , Rfl:    Calcium  600-200 MG-UNIT per tablet, Take 1 tablet by mouth daily., Disp: , Rfl:    fish oil-omega-3 fatty acids 1000 MG capsule, Take 1 g by mouth daily., Disp: , Rfl:    Flaxseed, Linseed, (FLAX SEED OIL) 1000 MG CAPS, Take by  mouth daily., Disp: , Rfl:    FLUoxetine  (PROZAC ) 10 MG capsule, TAKE 1 CAPSULE BY MOUTH EVERY DAY, Disp: 90 capsule, Rfl: 2   ibuprofen  (ADVIL ) 800 MG tablet, Take 1 tablet (800 mg total) by mouth every 8 (eight) hours as needed., Disp: 180 tablet, Rfl: 0   lansoprazole  (PREVACID ) 30 MG capsule, TAKE 1 CAPSULE BY MOUTH ONCE DAILY. TAKE AT LEAST 4 HOURS AFTER LEVOTHYROXINE  OR TAKE AT BEDTIME., Disp: 90 capsule, Rfl: 1   levothyroxine  (SYNTHROID ) 50 MCG tablet, TAKE 1 TABLET BY MOUTH DAILY BEFORE BREAKFAST, Disp: 90 tablet, Rfl: 1   liothyronine (CYTOMEL) 5 MCG tablet, Take 5 mcg by mouth every 3 (three) days., Disp: , Rfl:    magnesium  oxide (MAG-OX) 400 MG tablet, Take 400 mg by mouth daily., Disp: , Rfl:    predniSONE  (DELTASONE ) 10 MG tablet, 3 tabs x3 days and then 2 tabs x3 days and then 1 tab x3 days.  Take w/ food., Disp: 18 tablet, Rfl: 0   rosuvastatin  (CRESTOR ) 20 MG tablet, TAKE 1 TABLET BY MOUTH EVERY DAY, Disp: 90 tablet, Rfl: 1   sulfamethoxazole -trimethoprim  (BACTRIM  DS) 800-160 MG tablet, Take 1 tablet by mouth 2 (two) times daily., Disp: 20 tablet, Rfl: 0   terbinafine  (LAMISIL ) 250 MG tablet, Take 1 tablet (250 mg total) by mouth daily., Disp: 90 tablet, Rfl: 0   traZODone  (DESYREL ) 50 MG tablet, TAKE 0.5-1 TABLETS BY MOUTH AT BEDTIME AS NEEDED FOR  SLEEP., Disp: 30 tablet, Rfl: 3   Turmeric 500 MG CAPS, Take by mouth., Disp: , Rfl:   Allergies  Allergen Reactions   Amoxicillin      Diarrhea    Adhesive [Tape] Rash   Latex Rash    She says she has a mild reaction to this    Objective:   Pulse 70   Temp 98.5 F (36.9 C)   Ht 1' (0.305 m)   Wt 135 lb (61.2 kg)   SpO2 93%   BMI 659.13 kg/m  AAOx3, NAD NCAT, EOMI No obvious CN deficits Coloring WNL Pt is able to speak clearly, coherently without shortness of breath or increased work of breathing.  Thought process is linear.  Mood is appropriate.   Assessment and Plan:   COVID- new.  Pt's sxs and home test  consistent w/ dx.  Start Paxlovid .  Reviewed supportive care and red flags that should prompt return.  Pt expressed understanding and is in agreement w/ plan.    Comer Greet, MD 06/25/2024

## 2024-06-25 NOTE — Telephone Encounter (Signed)
 RN received call from PAS to triage. Pt had disconnected. RN called pt back and pt disconnected call. Will route to call backs. First attempt.   Copied from CRM #[id number]. Topic: Clinical - Red Word Triage >> Jun 25, 2024  9:02 AM Carlyon D wrote: Red Word that prompted transfer to Nurse Triage: + for covid 10/19, Pain in the face, fever, body shaking, congested, cough, sore throat, ear pain.

## 2024-06-25 NOTE — Telephone Encounter (Signed)
Virtual visit scheduled today.

## 2024-06-26 NOTE — Telephone Encounter (Signed)
 Patient is wondering if you can change her return to work note to Thursday? She feels she needs one more day due to being fatigue.

## 2024-06-27 NOTE — Telephone Encounter (Signed)
Note sent on My Chart.

## 2024-07-07 ENCOUNTER — Other Ambulatory Visit: Payer: Self-pay | Admitting: Family Medicine

## 2024-07-25 DIAGNOSIS — L820 Inflamed seborrheic keratosis: Secondary | ICD-10-CM | POA: Insufficient documentation

## 2024-07-26 ENCOUNTER — Encounter: Payer: Self-pay | Admitting: Family Medicine

## 2024-07-26 ENCOUNTER — Ambulatory Visit: Admitting: Family Medicine

## 2024-07-26 VITALS — BP 122/72 | HR 48 | Temp 98.0°F | Ht 66.0 in | Wt 141.5 lb

## 2024-07-26 DIAGNOSIS — L309 Dermatitis, unspecified: Secondary | ICD-10-CM

## 2024-07-26 DIAGNOSIS — E041 Nontoxic single thyroid nodule: Secondary | ICD-10-CM | POA: Diagnosis not present

## 2024-07-26 DIAGNOSIS — E039 Hypothyroidism, unspecified: Secondary | ICD-10-CM | POA: Diagnosis not present

## 2024-07-26 DIAGNOSIS — E785 Hyperlipidemia, unspecified: Secondary | ICD-10-CM

## 2024-07-26 LAB — HEPATIC FUNCTION PANEL
ALT: 22 U/L (ref 0–35)
AST: 24 U/L (ref 0–37)
Albumin: 4.6 g/dL (ref 3.5–5.2)
Alkaline Phosphatase: 42 U/L (ref 39–117)
Bilirubin, Direct: 0.1 mg/dL (ref 0.0–0.3)
Total Bilirubin: 0.8 mg/dL (ref 0.2–1.2)
Total Protein: 6.4 g/dL (ref 6.0–8.3)

## 2024-07-26 LAB — LIPID PANEL
Cholesterol: 158 mg/dL (ref 0–200)
HDL: 55.5 mg/dL (ref >39.00)
LDL Cholesterol: 71 mg/dL (ref 0–99)
NonHDL: 102.34
Total CHOL/HDL Ratio: 3
Triglycerides: 157 mg/dL — ABNORMAL HIGH (ref 0.0–149.0)
VLDL: 31.4 mg/dL (ref 0.0–40.0)

## 2024-07-26 LAB — BASIC METABOLIC PANEL WITH GFR
BUN: 15 mg/dL (ref 6–23)
CO2: 29 meq/L (ref 19–32)
Calcium: 9.3 mg/dL (ref 8.4–10.5)
Chloride: 104 meq/L (ref 96–112)
Creatinine, Ser: 0.79 mg/dL (ref 0.40–1.20)
GFR: 80.68 mL/min (ref >60.00)
Glucose, Bld: 90 mg/dL (ref 70–99)
Potassium: 4.3 meq/L (ref 3.5–5.1)
Sodium: 141 meq/L (ref 135–145)

## 2024-07-26 LAB — T3, FREE: T3, Free: 3.3 pg/mL (ref 2.3–4.2)

## 2024-07-26 LAB — TSH: TSH: 1.97 u[IU]/mL (ref 0.35–5.50)

## 2024-07-26 LAB — T4, FREE: Free T4: 0.75 ng/dL (ref 0.60–1.60)

## 2024-07-26 MED ORDER — TRIAMCINOLONE ACETONIDE 0.1 % EX OINT: 1.0000 | TOPICAL_OINTMENT | Freq: Two times a day (BID) | CUTANEOUS | 1 refills | Status: AC

## 2024-07-26 NOTE — Patient Instructions (Signed)
 Schedule your complete physical in 6 months We'll notify you of your lab results and make any changes if needed Apply hydrocortisone  cream to the rash on the face, use the Triamcinolone  ointment to the itchy areas on the back We'll call you to schedule your thyroid  ultrasound Keep up the good work on healthy diet and regular exercise- you look great! Call with any questions or concerns Stay Safe!  Stay Healthy! Happy Holidays!!

## 2024-07-26 NOTE — Assessment & Plan Note (Signed)
 Chronic problem.  On Crestor 20mg  daily w/o difficulty.  Check labs.  Adjust meds prn

## 2024-07-26 NOTE — Progress Notes (Signed)
   Subjective:    Patient ID: Robin Mueller, female    DOB: [birth year], 63 y.o.   MRN: [id number]  HPI Hyperlipidemia- chronic problem, on Crestor  20mg  daily.  No CP, SOB, abd pain, N/V.  Hypothyroid- chronic problem, no longer on Levothyroxine .  Currently on Cytomel 5mcg q3 days.  + skin rashes.  Endo is asking for repeat thyroid  US   Rash- pt has itchy area on back.  Appears to be little pimples.  No changes to soaps or detergents.  Review of Systems For ROS see HPI     Objective:   Physical Exam Vitals reviewed.  Constitutional:      General: She is not in acute distress.    Appearance: Normal appearance. She is well-developed. She is not ill-appearing.  HENT:     Head: Normocephalic and atraumatic.  Eyes:     Conjunctiva/sclera: Conjunctivae normal.     Pupils: Pupils are equal, round, and reactive to light.  Neck:     Thyroid : No thyromegaly.  Cardiovascular:     Rate and Rhythm: Normal rate and regular rhythm.     Heart sounds: Normal heart sounds. No murmur heard. Pulmonary:     Effort: Pulmonary effort is normal. No respiratory distress.     Breath sounds: Normal breath sounds.  Abdominal:     General: There is no distension.     Palpations: Abdomen is soft.     Tenderness: There is no abdominal tenderness.  Musculoskeletal:     Cervical back: Normal range of motion and neck supple.  Lymphadenopathy:     Cervical: No cervical adenopathy.  Skin:    General: Skin is warm and dry.     Findings: Rash (eczematous areas on cheeks bilaterally) present.  Neurological:     General: No focal deficit present.     Mental Status: She is alert and oriented to person, place, and time.  Psychiatric:        Mood and Affect: Mood normal.        Behavior: Behavior normal.        Thought Content: Thought content normal.           Assessment & Plan:  Eczema- new.  Start Triamcinolone  ointment on back, OTC hydrocortisone  on face.  Reviewed supportive care and red  flags that should prompt return.  Pt expressed understanding and is in agreement w/ plan.

## 2024-07-26 NOTE — Assessment & Plan Note (Signed)
 Chronic problem.  On Cytomel 5mcg q3 days but no longer on Levothyroxine .  Endo is asking for repeat thyroid  US .  Imaging ordered.  Check labs.  Adjust meds prn

## 2024-07-27 ENCOUNTER — Ambulatory Visit: Payer: Self-pay | Admitting: Family Medicine

## 2024-07-31 ENCOUNTER — Other Ambulatory Visit: Payer: Self-pay | Admitting: Student in an Organized Health Care Education/Training Program

## 2024-08-04 ENCOUNTER — Other Ambulatory Visit: Payer: Self-pay | Admitting: Student in an Organized Health Care Education/Training Program

## 2024-08-06 NOTE — Telephone Encounter (Signed)
 Requested Prescriptions   Pending Prescriptions Disp Refills   traZODone  (DESYREL ) 50 MG tablet [Pharmacy Med Name: TRAZODONE  50 MG TABLET] 30 tablet 0    Sig: TAKE 1 TABLET BY MOUTH AT BEDTIME AS NEEDED FOR SLEEP.     Date of patient request: 08/06/2024 Last office visit: 07/26/2024 Upcoming visit: 01/24/2025 Date of last refill: 07/09/2024 Last refill amount: 30

## 2024-08-14 ENCOUNTER — Ambulatory Visit (HOSPITAL_BASED_OUTPATIENT_CLINIC_OR_DEPARTMENT_OTHER)

## 2024-08-14 ENCOUNTER — Ambulatory Visit (HOSPITAL_BASED_OUTPATIENT_CLINIC_OR_DEPARTMENT_OTHER): Admission: RE | Admit: 2024-08-14 | Discharge: 2024-08-14 | Attending: Family Medicine | Admitting: Family Medicine

## 2024-08-14 DIAGNOSIS — E041 Nontoxic single thyroid nodule: Secondary | ICD-10-CM

## 2024-09-08 ENCOUNTER — Other Ambulatory Visit: Payer: Self-pay | Admitting: Family Medicine

## 2024-09-10 NOTE — Telephone Encounter (Signed)
 Requested Prescriptions   Pending Prescriptions Disp Refills   traZODone  (DESYREL ) 50 MG tablet [Pharmacy Med Name: TRAZODONE  50 MG TABLET] 90 tablet 1    Sig: TAKE 1 TABLET BY MOUTH EVERY DAY AT BEDTIME AS NEEDED FOR SLEEP     Date of patient request: 09/11/23 Last office visit: 07/26/2024 Upcoming visit: 01/24/2025 Date of last refill: 08/06/24 Last refill amount: 30

## 2024-09-11 ENCOUNTER — Other Ambulatory Visit: Payer: Self-pay | Admitting: Family Medicine

## 2024-10-03 ENCOUNTER — Other Ambulatory Visit: Payer: Self-pay | Admitting: Gastroenterology

## 2025-01-24 ENCOUNTER — Encounter: Admitting: Family Medicine
